# Patient Record
Sex: Female | Born: 1950 | Race: White | Hispanic: No | Smoking: Never smoker
Health system: Southern US, Community
[De-identification: ages and names within clinical notes are randomized; demographics above are authoritative.]

## PROBLEM LIST (undated history)

## (undated) DIAGNOSIS — K219 Gastro-esophageal reflux disease without esophagitis: Secondary | ICD-10-CM

## (undated) DIAGNOSIS — E119 Type 2 diabetes mellitus without complications: Secondary | ICD-10-CM

## (undated) DIAGNOSIS — R112 Nausea with vomiting, unspecified: Secondary | ICD-10-CM

## (undated) DIAGNOSIS — I10 Essential (primary) hypertension: Secondary | ICD-10-CM

## (undated) DIAGNOSIS — C50919 Malignant neoplasm of unspecified site of unspecified female breast: Secondary | ICD-10-CM

## (undated) DIAGNOSIS — Z800 Family history of malignant neoplasm of digestive organs: Secondary | ICD-10-CM

## (undated) DIAGNOSIS — Z9889 Other specified postprocedural states: Secondary | ICD-10-CM

## (undated) DIAGNOSIS — M1711 Unilateral primary osteoarthritis, right knee: Secondary | ICD-10-CM

## (undated) DIAGNOSIS — Z923 Personal history of irradiation: Secondary | ICD-10-CM

## (undated) DIAGNOSIS — M1990 Unspecified osteoarthritis, unspecified site: Secondary | ICD-10-CM

## (undated) DIAGNOSIS — Z803 Family history of malignant neoplasm of breast: Secondary | ICD-10-CM

## (undated) DIAGNOSIS — I639 Cerebral infarction, unspecified: Secondary | ICD-10-CM

## (undated) DIAGNOSIS — C50911 Malignant neoplasm of unspecified site of right female breast: Principal | ICD-10-CM

## (undated) DIAGNOSIS — J189 Pneumonia, unspecified organism: Secondary | ICD-10-CM

## (undated) HISTORY — PX: REDUCTION MAMMAPLASTY: SUR839

## (undated) HISTORY — DX: Malignant neoplasm of unspecified site of right female breast: C50.911

## (undated) HISTORY — PX: KNEE ARTHROSCOPY: SUR90

## (undated) HISTORY — DX: Family history of malignant neoplasm of digestive organs: Z80.0

## (undated) HISTORY — DX: Family history of malignant neoplasm of breast: Z80.3

---

## 1986-02-01 DIAGNOSIS — J189 Pneumonia, unspecified organism: Secondary | ICD-10-CM

## 1986-02-01 HISTORY — DX: Pneumonia, unspecified organism: J18.9

## 1992-02-02 HISTORY — PX: ABDOMINAL HYSTERECTOMY: SHX81

## 1997-02-01 HISTORY — PX: ANAL FISSURE REPAIR: SHX2312

## 1997-07-12 ENCOUNTER — Ambulatory Visit (HOSPITAL_COMMUNITY): Admission: RE | Admit: 1997-07-12 | Discharge: 1997-07-12 | Payer: Self-pay | Admitting: *Deleted

## 1998-02-04 ENCOUNTER — Ambulatory Visit (HOSPITAL_COMMUNITY): Admission: RE | Admit: 1998-02-04 | Discharge: 1998-02-04 | Payer: Self-pay | Admitting: Obstetrics and Gynecology

## 1998-12-03 ENCOUNTER — Encounter: Admission: RE | Admit: 1998-12-03 | Discharge: 1998-12-03 | Payer: Self-pay | Admitting: Family Medicine

## 1998-12-03 ENCOUNTER — Encounter: Payer: Self-pay | Admitting: Obstetrics and Gynecology

## 1999-03-09 ENCOUNTER — Other Ambulatory Visit: Admission: RE | Admit: 1999-03-09 | Discharge: 1999-03-09 | Payer: Self-pay | Admitting: Obstetrics and Gynecology

## 1999-12-08 ENCOUNTER — Encounter: Payer: Self-pay | Admitting: Obstetrics and Gynecology

## 1999-12-08 ENCOUNTER — Encounter: Admission: RE | Admit: 1999-12-08 | Discharge: 1999-12-08 | Payer: Self-pay | Admitting: Obstetrics and Gynecology

## 2000-04-12 ENCOUNTER — Other Ambulatory Visit: Admission: RE | Admit: 2000-04-12 | Discharge: 2000-04-12 | Payer: Self-pay | Admitting: Obstetrics and Gynecology

## 2000-12-08 ENCOUNTER — Encounter: Payer: Self-pay | Admitting: Obstetrics and Gynecology

## 2000-12-08 ENCOUNTER — Encounter: Admission: RE | Admit: 2000-12-08 | Discharge: 2000-12-08 | Payer: Self-pay | Admitting: Obstetrics and Gynecology

## 2001-08-29 ENCOUNTER — Other Ambulatory Visit: Admission: RE | Admit: 2001-08-29 | Discharge: 2001-08-29 | Payer: Self-pay | Admitting: Obstetrics and Gynecology

## 2001-12-14 ENCOUNTER — Encounter: Admission: RE | Admit: 2001-12-14 | Discharge: 2001-12-14 | Payer: Self-pay | Admitting: Obstetrics and Gynecology

## 2001-12-14 ENCOUNTER — Encounter: Payer: Self-pay | Admitting: Obstetrics and Gynecology

## 2002-10-01 ENCOUNTER — Other Ambulatory Visit: Admission: RE | Admit: 2002-10-01 | Discharge: 2002-10-01 | Payer: Self-pay | Admitting: Obstetrics and Gynecology

## 2002-12-21 ENCOUNTER — Ambulatory Visit (HOSPITAL_COMMUNITY): Admission: RE | Admit: 2002-12-21 | Discharge: 2002-12-21 | Payer: Self-pay | Admitting: Obstetrics and Gynecology

## 2003-01-28 ENCOUNTER — Ambulatory Visit (HOSPITAL_COMMUNITY): Admission: RE | Admit: 2003-01-28 | Discharge: 2003-01-28 | Payer: Self-pay | Admitting: Gastroenterology

## 2003-11-11 ENCOUNTER — Other Ambulatory Visit: Admission: RE | Admit: 2003-11-11 | Discharge: 2003-11-11 | Payer: Self-pay | Admitting: Obstetrics and Gynecology

## 2004-01-03 ENCOUNTER — Ambulatory Visit (HOSPITAL_COMMUNITY): Admission: RE | Admit: 2004-01-03 | Discharge: 2004-01-03 | Payer: Self-pay | Admitting: Obstetrics and Gynecology

## 2005-02-01 DIAGNOSIS — Z923 Personal history of irradiation: Secondary | ICD-10-CM

## 2005-02-01 HISTORY — DX: Personal history of irradiation: Z92.3

## 2005-02-25 ENCOUNTER — Ambulatory Visit (HOSPITAL_COMMUNITY): Admission: RE | Admit: 2005-02-25 | Discharge: 2005-02-25 | Payer: Self-pay | Admitting: Obstetrics and Gynecology

## 2005-03-03 ENCOUNTER — Encounter (INDEPENDENT_AMBULATORY_CARE_PROVIDER_SITE_OTHER): Payer: Self-pay | Admitting: Diagnostic Radiology

## 2005-03-03 ENCOUNTER — Encounter (INDEPENDENT_AMBULATORY_CARE_PROVIDER_SITE_OTHER): Payer: Self-pay | Admitting: *Deleted

## 2005-03-03 ENCOUNTER — Encounter: Admission: RE | Admit: 2005-03-03 | Discharge: 2005-03-03 | Payer: Self-pay | Admitting: Obstetrics and Gynecology

## 2005-03-05 HISTORY — PX: BREAST BIOPSY: SHX20

## 2005-03-11 ENCOUNTER — Encounter: Admission: RE | Admit: 2005-03-11 | Discharge: 2005-03-11 | Payer: Self-pay | Admitting: Surgery

## 2005-03-18 ENCOUNTER — Encounter: Admission: RE | Admit: 2005-03-18 | Discharge: 2005-03-18 | Payer: Self-pay | Admitting: Surgery

## 2005-03-18 ENCOUNTER — Encounter (INDEPENDENT_AMBULATORY_CARE_PROVIDER_SITE_OTHER): Payer: Self-pay | Admitting: *Deleted

## 2005-03-18 ENCOUNTER — Ambulatory Visit (HOSPITAL_BASED_OUTPATIENT_CLINIC_OR_DEPARTMENT_OTHER): Admission: RE | Admit: 2005-03-18 | Discharge: 2005-03-18 | Payer: Self-pay | Admitting: Surgery

## 2005-03-23 ENCOUNTER — Ambulatory Visit: Payer: Self-pay | Admitting: Oncology

## 2005-03-25 HISTORY — PX: BREAST LUMPECTOMY: SHX2

## 2005-03-29 ENCOUNTER — Ambulatory Visit: Admission: RE | Admit: 2005-03-29 | Discharge: 2005-04-16 | Payer: Self-pay | Admitting: Radiation Oncology

## 2005-05-03 ENCOUNTER — Encounter: Admission: RE | Admit: 2005-05-03 | Discharge: 2005-05-03 | Payer: Self-pay | Admitting: *Deleted

## 2005-05-10 ENCOUNTER — Ambulatory Visit: Admission: RE | Admit: 2005-05-10 | Discharge: 2005-07-13 | Payer: Self-pay | Admitting: Radiation Oncology

## 2005-07-09 ENCOUNTER — Ambulatory Visit: Payer: Self-pay | Admitting: Oncology

## 2005-12-29 ENCOUNTER — Encounter: Admission: RE | Admit: 2005-12-29 | Discharge: 2005-12-29 | Payer: Self-pay | Admitting: Oncology

## 2006-07-19 ENCOUNTER — Ambulatory Visit: Payer: Self-pay | Admitting: Oncology

## 2007-01-02 ENCOUNTER — Encounter (INDEPENDENT_AMBULATORY_CARE_PROVIDER_SITE_OTHER): Payer: Self-pay | Admitting: Radiology

## 2007-01-02 ENCOUNTER — Encounter: Admission: RE | Admit: 2007-01-02 | Discharge: 2007-01-02 | Payer: Self-pay | Admitting: Surgery

## 2007-01-10 ENCOUNTER — Ambulatory Visit (HOSPITAL_COMMUNITY): Admission: RE | Admit: 2007-01-10 | Discharge: 2007-01-10 | Payer: Self-pay | Admitting: Oncology

## 2007-07-18 ENCOUNTER — Ambulatory Visit: Payer: Self-pay | Admitting: Oncology

## 2007-07-20 LAB — CBC WITH DIFFERENTIAL/PLATELET
BASO%: 0.4 % (ref 0.0–2.0)
EOS%: 1.1 % (ref 0.0–7.0)
MCH: 30.7 pg (ref 26.0–34.0)
MCHC: 34.5 g/dL (ref 32.0–36.0)
MCV: 89.1 fL (ref 81.0–101.0)
MONO%: 7 % (ref 0.0–13.0)
RBC: 4.22 10*6/uL (ref 3.70–5.32)
RDW: 14.5 % (ref 11.3–14.5)
lymph#: 1.5 10*3/uL (ref 0.9–3.3)

## 2007-07-21 LAB — COMPREHENSIVE METABOLIC PANEL
ALT: 18 U/L (ref 0–35)
AST: 16 U/L (ref 0–37)
Albumin: 4.4 g/dL (ref 3.5–5.2)
Alkaline Phosphatase: 60 U/L (ref 39–117)
BUN: 20 mg/dL (ref 6–23)
Potassium: 3.7 mEq/L (ref 3.5–5.3)
Sodium: 138 mEq/L (ref 135–145)
Total Protein: 7.3 g/dL (ref 6.0–8.3)

## 2008-01-03 ENCOUNTER — Encounter: Admission: RE | Admit: 2008-01-03 | Discharge: 2008-01-03 | Payer: Self-pay | Admitting: Oncology

## 2008-07-16 ENCOUNTER — Ambulatory Visit: Payer: Self-pay | Admitting: Oncology

## 2008-07-18 LAB — CBC WITH DIFFERENTIAL/PLATELET
BASO%: 0.5 % (ref 0.0–2.0)
Eosinophils Absolute: 0.2 10*3/uL (ref 0.0–0.5)
HCT: 35.5 % (ref 34.8–46.6)
LYMPH%: 30.3 % (ref 14.0–49.7)
MCHC: 36 g/dL (ref 31.5–36.0)
MONO#: 0.3 10*3/uL (ref 0.1–0.9)
NEUT#: 3.6 10*3/uL (ref 1.5–6.5)
NEUT%: 60.7 % (ref 38.4–76.8)
Platelets: 250 10*3/uL (ref 145–400)
WBC: 5.9 10*3/uL (ref 3.9–10.3)
lymph#: 1.8 10*3/uL (ref 0.9–3.3)

## 2008-07-19 LAB — COMPREHENSIVE METABOLIC PANEL
ALT: 19 U/L (ref 0–35)
CO2: 27 mEq/L (ref 19–32)
Calcium: 9.2 mg/dL (ref 8.4–10.5)
Chloride: 98 mEq/L (ref 96–112)
Glucose, Bld: 107 mg/dL — ABNORMAL HIGH (ref 70–99)
Sodium: 136 mEq/L (ref 135–145)
Total Protein: 6.7 g/dL (ref 6.0–8.3)

## 2008-07-19 LAB — CANCER ANTIGEN 27.29: CA 27.29: 24 U/mL (ref 0–39)

## 2008-07-19 LAB — VITAMIN D 25 HYDROXY (VIT D DEFICIENCY, FRACTURES): Vit D, 25-Hydroxy: 31 ng/mL (ref 30–89)

## 2008-08-08 ENCOUNTER — Ambulatory Visit (HOSPITAL_BASED_OUTPATIENT_CLINIC_OR_DEPARTMENT_OTHER): Admission: RE | Admit: 2008-08-08 | Discharge: 2008-08-08 | Payer: Self-pay | Admitting: Surgery

## 2008-08-08 ENCOUNTER — Encounter (INDEPENDENT_AMBULATORY_CARE_PROVIDER_SITE_OTHER): Payer: Self-pay | Admitting: Surgery

## 2009-01-02 ENCOUNTER — Ambulatory Visit: Payer: Self-pay | Admitting: Oncology

## 2009-01-03 ENCOUNTER — Encounter: Admission: RE | Admit: 2009-01-03 | Discharge: 2009-01-03 | Payer: Self-pay | Admitting: Oncology

## 2009-01-06 LAB — COMPREHENSIVE METABOLIC PANEL
Alkaline Phosphatase: 51 U/L (ref 39–117)
CO2: 31 mEq/L (ref 19–32)
Creatinine, Ser: 1.04 mg/dL (ref 0.40–1.20)
Glucose, Bld: 79 mg/dL (ref 70–99)
Sodium: 138 mEq/L (ref 135–145)
Total Bilirubin: 0.6 mg/dL (ref 0.3–1.2)
Total Protein: 7.2 g/dL (ref 6.0–8.3)

## 2009-01-07 ENCOUNTER — Ambulatory Visit (HOSPITAL_COMMUNITY): Admission: RE | Admit: 2009-01-07 | Discharge: 2009-01-07 | Payer: Self-pay | Admitting: Oncology

## 2009-02-01 HISTORY — PX: BREAST REDUCTION SURGERY: SHX8

## 2009-07-11 ENCOUNTER — Ambulatory Visit: Payer: Self-pay | Admitting: Oncology

## 2009-07-15 LAB — COMPREHENSIVE METABOLIC PANEL
ALT: 25 U/L (ref 0–35)
Alkaline Phosphatase: 53 U/L (ref 39–117)
Creatinine, Ser: 0.79 mg/dL (ref 0.40–1.20)
Sodium: 140 mEq/L (ref 135–145)
Total Bilirubin: 0.4 mg/dL (ref 0.3–1.2)
Total Protein: 7.1 g/dL (ref 6.0–8.3)

## 2009-07-15 LAB — CBC WITH DIFFERENTIAL/PLATELET
BASO%: 0.4 % (ref 0.0–2.0)
EOS%: 1.7 % (ref 0.0–7.0)
HCT: 39 % (ref 34.8–46.6)
LYMPH%: 26.1 % (ref 14.0–49.7)
MCH: 32 pg (ref 25.1–34.0)
MCHC: 35.2 g/dL (ref 31.5–36.0)
MCV: 90.9 fL (ref 79.5–101.0)
MONO%: 6.3 % (ref 0.0–14.0)
NEUT%: 65.5 % (ref 38.4–76.8)
Platelets: 275 10*3/uL (ref 145–400)
RBC: 4.3 10*6/uL (ref 3.70–5.45)
WBC: 6.9 10*3/uL (ref 3.9–10.3)

## 2009-07-22 ENCOUNTER — Ambulatory Visit (HOSPITAL_COMMUNITY): Admission: RE | Admit: 2009-07-22 | Discharge: 2009-07-22 | Payer: Self-pay | Admitting: Oncology

## 2010-01-05 ENCOUNTER — Encounter: Admission: RE | Admit: 2010-01-05 | Discharge: 2010-01-05 | Payer: Self-pay | Admitting: Oncology

## 2010-06-16 NOTE — Op Note (Signed)
[name], [name]                 ACCOUNT NO.:  [id number]   MEDICAL RECORD NO.:  [id number]          PATIENT TYPE:  AMB   LOCATION:  DSC                          FACILITY:  [facility]   PHYSICIAN:  Currie Paris, M.D.DATE OF BIRTH:  06/27/1950   DATE OF PROCEDURE:  08/08/2008  DATE OF DISCHARGE:                               OPERATIVE REPORT   PREOPERATIVE DIAGNOSIS:  Lipoma, right leg.   POSTOPERATIVE DIAGNOSIS:  Lipoma, right leg.   OPERATION:  This is a 60 year old lady with a gradually enlarging soft  tissue mass of the medial aspect of the right leg just above the knee.  It measured about 4-5 cm in size.  It was also knowing to her when she  is walking around, it gave her some discomfort, so she wished to have  this removed.   DESCRIPTION OF PROCEDURE:  The patient was seen in the minor procedure  room and the lipoma located and plans for the surgery discussed and  reviewed with the patient.  The area was then anesthetized with 1%  Xylocaine with epi.  I waited 10 minutes for that to take full effect  and then the area was prepped with Betadine and draped.   Vertical incision was made and the subcutaneous tissue entered.  The  lipoma was readily visible and was gently dissected out with a blunt  dissection and came out intact and in toto.  It measured 5 cm in longest  diameter.   Everything appeared to be dry.  The incision was closed in layers with 3-  0 Vicryl, 4-0 Monocryl, subcuticular, and Dermabond.   The patient tolerated the procedure well and there were no  complications.      Currie Paris, M.D.  Electronically Signed     CJS/MEDQ  D:  08/08/2008  T:  08/08/2008  Job:  [id number]

## 2010-06-19 NOTE — Op Note (Signed)
NAMEAISHANI, KALIS                 ACCOUNT NO.:  [id number]   MEDICAL RECORD NO.:  [id number]          PATIENT TYPE:  AMB   LOCATION:  DSC                          FACILITY:  [facility]   PHYSICIAN:  Currie Paris, M.D.DATE OF BIRTH:  1950/06/17   DATE OF PROCEDURE:  03/18/2005  DATE OF DISCHARGE:  02/25/2005                                 OPERATIVE REPORT   OFFICE MEDICAL RECORD NUMBER [id number].   PREOPERATIVE DIAGNOSIS:  Ductal in situ carcinoma, right breast upper outer  quadrant.   POSTOPERATIVE DIAGNOSIS:  Ductal in situ carcinoma, right breast upper outer  quadrant.   OPERATION:  Needle guided excision right breast cancer.   SURGEON:  Currie Paris, M.D.   ANESTHESIA:  General.   CLINICAL HISTORY:  This is a 67 lady whose recent routine mammogram recently  showed a focus of calcifications in the upper outer quadrant of the right  breast.  A stereotactic core biopsy showed DCIS.  MRI showed no other  obvious malignancies, and she was scheduled for needle guided excisional  biopsy.   DESCRIPTION OF PROCEDURE:  The patient was seen in the holding area, and she  had no further questions.  We both identified and marked the right breast as  the operative side, and she already had her guidewire placed into the right  breast.  I reviewed the films and ascertained that the guidewire appeared  very close the calcifications and the clip that had been placed at the time  of the original biopsy.   The patient was taken to the operating room and fter satisfactory general  anesthesia had been obtained the right breast was prepped and draped.  The  time-out occurred.   I made a curvilinear incision taking a small sliver of the skin around the  guidewire and to incorporate the prior skin biopsy site.  The guidewire  tracked deep, slightly laterally and slightly inferiorly.  On the  preoperative mammogram, the guidewire appeared to track more inferiorly than  it appeared when  the patient was supine.   Once the incision was made, I used the cautery and divided the breast tissue  superior to the guidewire down towards the chest wall.  We went down several  centimeters almost to the chest wall and then I worked medially around the  guidewires the guidewire was tracking laterally.  As I worked inferiorly,  there was a fair amount of thickening and I think she had some post biopsy  change here with perhaps a little biopsy hematoma but I took care to get  around that inferiorly and then finally went under the area in question and  then lateral to it and completed the lumpectomy.  The guidewire was  completely inside of the specimen and I was beyond the tip of the guidewire.  By palpation to the firm area which again was related to biopsy was the  medial inferior aspect of the biopsy site.   While the specimen was being evaluated by mammography I went ahead and took  approximately another 1 cm piece of tissue from the  inferior, medial, and  deep margins to be sure these were all widely clear.  This was marked for  pathology.   I spent several minutes making sure everything was dry.  I put Marcaine in  to help with postoperative he marked cane in to help with postoperative  analgesia.  I reviewed the specimen mammogram and the calcifications  appeared to be in the center of the specimen.   Once everything was dry, I closed using some 3-0 Vicryl to try to  reapproximate some of the more superficial breast tissue and then 4-0  Monocryl subcuticular.   Dr. Derrek Gu, the pathologist, indicated that all the touch preps on margins  were negative.   Dermabond was applied and then sterile dressings.  The patient tolerated  procedure well. There no operative complications.  All counts were correct.      Currie Paris, M.D.  Electronically Signed     CJS/MEDQ  D:  03/18/2005  T:  03/18/2005  Job:  [id number]   cc:   Dellis Anes. Idell Pickles, M.D.  Fax: [phone]

## 2010-06-19 NOTE — Op Note (Signed)
[name], [name]                           ACCOUNT NO.:  [id number]   MEDICAL RECORD NO.:  [id number]                   PATIENT TYPE:  AMB   LOCATION:  ENDO                                 FACILITY:  [facility]   PHYSICIAN:  John C. Madilyn Fireman, M.D.                 DATE OF BIRTH:  [birth year]   DATE OF PROCEDURE:  01/28/2003  DATE OF DISCHARGE:                                 OPERATIVE REPORT   PROCEDURE:  Colonoscopy.   INDICATIONS FOR PROCEDURE:  Average risk colon cancer screening, in a 81-  year-old patient with no prior colon screening.   DESCRIPTION OF PROCEDURE:  The patient was placed in the left lateral  decubitus position and placed on the pulse monitor, with continuous low-flow  oxygen delivered by nasal cannula.  She was sedated with 100 mcg IV Fentanyl  and 8 mg IV Versed.  The Olympus video colonoscope was inserted into the  rectum and advanced to the cecum, confirmed by transillumination of  McBurney's point and visualization of the ileocecal valve and appendiceal  orifice.  The prep was excellent.  The cecum, ascending, transverse,  descending and sigmoid colon all appeared normal -- with no masses, polyps,  diverticula or other mucosal abnormalities.  The rectum, likewise, appeared  normal and retroflex view of the anus revealed no obvious internal  hemorrhoids.  The colonoscope was then withdrawn and the patient returned to  the recovery room in stable condition.  She tolerated the procedure well and  there were no immediate complications.   IMPRESSION:  Normal colonoscopy.   PLAN:  Repeat study in five years.                                               John C. Madilyn Fireman, M.D.    JCH/MEDQ  D:  01/28/2003  T:  01/28/2003  Job:  [id number]   cc:   Marcelino Duster L. Vincente Poli, M.D.  [address] [place]  [place] [address]  Fax: [phone]

## 2010-07-16 ENCOUNTER — Other Ambulatory Visit: Payer: Self-pay | Admitting: Oncology

## 2010-07-16 ENCOUNTER — Encounter (HOSPITAL_BASED_OUTPATIENT_CLINIC_OR_DEPARTMENT_OTHER): Payer: BC Managed Care – PPO | Admitting: Oncology

## 2010-07-16 DIAGNOSIS — Z170 Estrogen receptor positive status [ER+]: Secondary | ICD-10-CM

## 2010-07-16 DIAGNOSIS — D0590 Unspecified type of carcinoma in situ of unspecified breast: Secondary | ICD-10-CM

## 2010-07-16 LAB — VITAMIN D 25 HYDROXY (VIT D DEFICIENCY, FRACTURES): Vit D, 25-Hydroxy: 47 ng/mL (ref 30–89)

## 2010-07-16 LAB — CBC WITH DIFFERENTIAL/PLATELET
Basophils Absolute: 0 10*3/uL (ref 0.0–0.1)
EOS%: 2.9 % (ref 0.0–7.0)
Eosinophils Absolute: 0.2 10*3/uL (ref 0.0–0.5)
HCT: 37.3 % (ref 34.8–46.6)
HGB: 12.9 g/dL (ref 11.6–15.9)
MCH: 31.2 pg (ref 25.1–34.0)
MCV: 90.5 fL (ref 79.5–101.0)
MONO%: 6.2 % (ref 0.0–14.0)
NEUT#: 4.7 10*3/uL (ref 1.5–6.5)
NEUT%: 65.2 % (ref 38.4–76.8)
RDW: 14.3 % (ref 11.2–14.5)
lymph#: 1.8 10*3/uL (ref 0.9–3.3)

## 2010-07-16 LAB — COMPREHENSIVE METABOLIC PANEL
AST: 21 U/L (ref 0–37)
Albumin: 3.8 g/dL (ref 3.5–5.2)
BUN: 13 mg/dL (ref 6–23)
Calcium: 9.8 mg/dL (ref 8.4–10.5)
Chloride: 96 mEq/L (ref 96–112)
Creatinine, Ser: 0.7 mg/dL (ref 0.50–1.10)
Glucose, Bld: 160 mg/dL — ABNORMAL HIGH (ref 70–99)
Potassium: 3.3 mEq/L — ABNORMAL LOW (ref 3.5–5.3)

## 2010-07-22 ENCOUNTER — Other Ambulatory Visit: Payer: Self-pay | Admitting: Oncology

## 2010-07-22 ENCOUNTER — Encounter (HOSPITAL_BASED_OUTPATIENT_CLINIC_OR_DEPARTMENT_OTHER): Payer: BC Managed Care – PPO | Admitting: Oncology

## 2010-07-22 DIAGNOSIS — D0590 Unspecified type of carcinoma in situ of unspecified breast: Secondary | ICD-10-CM

## 2010-07-22 DIAGNOSIS — Z170 Estrogen receptor positive status [ER+]: Secondary | ICD-10-CM

## 2010-07-22 DIAGNOSIS — Z9889 Other specified postprocedural states: Secondary | ICD-10-CM

## 2011-01-02 ENCOUNTER — Telehealth: Payer: Self-pay | Admitting: Oncology

## 2011-01-02 NOTE — Telephone Encounter (Signed)
per pof 07/22/2010 called pt and scheduled her appts for [id number]

## 2011-01-07 ENCOUNTER — Ambulatory Visit
Admission: RE | Admit: 2011-01-07 | Discharge: 2011-01-07 | Disposition: A | Discharge status: Home / Self Care | Payer: BC Managed Care – PPO | Source: Ambulatory Visit | Attending: Oncology | Admitting: Oncology

## 2011-01-07 DIAGNOSIS — Z9889 Other specified postprocedural states: Secondary | ICD-10-CM

## 2011-01-18 ENCOUNTER — Other Ambulatory Visit: Payer: Self-pay | Admitting: Oncology

## 2011-01-18 DIAGNOSIS — Z9889 Other specified postprocedural states: Secondary | ICD-10-CM

## 2011-01-18 DIAGNOSIS — Z853 Personal history of malignant neoplasm of breast: Secondary | ICD-10-CM

## 2011-01-28 ENCOUNTER — Ambulatory Visit
Admission: RE | Admit: 2011-01-28 | Discharge: 2011-01-28 | Disposition: A | Discharge status: Home / Self Care | Payer: BC Managed Care – PPO | Source: Ambulatory Visit | Attending: Oncology | Admitting: Oncology

## 2011-01-28 DIAGNOSIS — Z853 Personal history of malignant neoplasm of breast: Secondary | ICD-10-CM

## 2011-01-28 DIAGNOSIS — Z9889 Other specified postprocedural states: Secondary | ICD-10-CM

## 2011-01-28 MED ORDER — GADOBENATE DIMEGLUMINE 529 MG/ML IV SOLN
19.0000 mL | Freq: Once | INTRAVENOUS | Status: AC | PRN
  Administered 2011-01-28: 19 mL via INTRAVENOUS

## 2011-03-11 ENCOUNTER — Other Ambulatory Visit: Payer: Self-pay | Admitting: Family Medicine

## 2011-03-11 DIAGNOSIS — R1011 Right upper quadrant pain: Secondary | ICD-10-CM

## 2011-03-18 ENCOUNTER — Ambulatory Visit
Admission: RE | Admit: 2011-03-18 | Discharge: 2011-03-18 | Disposition: A | Discharge status: Home / Self Care | Payer: BC Managed Care – PPO | Source: Ambulatory Visit | Attending: Family Medicine | Admitting: Family Medicine

## 2011-03-18 DIAGNOSIS — R1011 Right upper quadrant pain: Secondary | ICD-10-CM

## 2011-03-19 ENCOUNTER — Other Ambulatory Visit (HOSPITAL_COMMUNITY): Payer: Self-pay | Admitting: Family Medicine

## 2011-04-01 ENCOUNTER — Encounter (HOSPITAL_COMMUNITY)
Admission: RE | Admit: 2011-04-01 | Discharge: 2011-04-01 | Disposition: A | Discharge status: Home / Self Care | Payer: BC Managed Care – PPO | Source: Ambulatory Visit | Attending: Family Medicine | Admitting: Family Medicine

## 2011-04-01 DIAGNOSIS — R109 Unspecified abdominal pain: Secondary | ICD-10-CM | POA: Insufficient documentation

## 2011-04-01 MED ORDER — TECHNETIUM TC 99M MEBROFENIN IV KIT
5.0000 | PACK | Freq: Once | INTRAVENOUS | Status: AC | PRN
  Administered 2011-04-01: 5 via INTRAVENOUS

## 2011-04-01 MED ORDER — SINCALIDE 5 MCG IJ SOLR
INTRAMUSCULAR | Status: AC
  Administered 2011-04-01: 1.85 ug via INTRAVENOUS
  Filled 2011-04-01: qty 5

## 2011-07-14 ENCOUNTER — Other Ambulatory Visit (HOSPITAL_BASED_OUTPATIENT_CLINIC_OR_DEPARTMENT_OTHER): Payer: BC Managed Care – PPO | Admitting: Lab

## 2011-07-14 DIAGNOSIS — Z170 Estrogen receptor positive status [ER+]: Secondary | ICD-10-CM

## 2011-07-14 DIAGNOSIS — D0590 Unspecified type of carcinoma in situ of unspecified breast: Secondary | ICD-10-CM

## 2011-07-14 LAB — CBC WITH DIFFERENTIAL/PLATELET
BASO%: 0.4 % (ref 0.0–2.0)
EOS%: 1.2 % (ref 0.0–7.0)
HCT: 38.8 % (ref 34.8–46.6)
LYMPH%: 16.1 % (ref 14.0–49.7)
MCH: 30.9 pg (ref 25.1–34.0)
MCHC: 33.9 g/dL (ref 31.5–36.0)
NEUT%: 76.9 % — ABNORMAL HIGH (ref 38.4–76.8)
lymph#: 1.5 10*3/uL (ref 0.9–3.3)

## 2011-07-15 LAB — COMPREHENSIVE METABOLIC PANEL
ALT: 28 U/L (ref 0–35)
AST: 20 U/L (ref 0–37)
Creatinine, Ser: 0.85 mg/dL (ref 0.50–1.10)
Total Bilirubin: 0.5 mg/dL (ref 0.3–1.2)

## 2011-07-21 ENCOUNTER — Ambulatory Visit (HOSPITAL_BASED_OUTPATIENT_CLINIC_OR_DEPARTMENT_OTHER): Payer: BC Managed Care – PPO | Admitting: Physician Assistant

## 2011-07-21 ENCOUNTER — Encounter: Payer: Self-pay | Admitting: Physician Assistant

## 2011-07-21 VITALS — BP 156/91 | HR 101 | Temp 98.2°F | Wt 209.2 lb

## 2011-07-21 DIAGNOSIS — C50911 Malignant neoplasm of unspecified site of right female breast: Secondary | ICD-10-CM

## 2011-07-21 DIAGNOSIS — Z853 Personal history of malignant neoplasm of breast: Secondary | ICD-10-CM

## 2011-07-21 DIAGNOSIS — J329 Chronic sinusitis, unspecified: Secondary | ICD-10-CM

## 2011-07-21 DIAGNOSIS — Z170 Estrogen receptor positive status [ER+]: Secondary | ICD-10-CM

## 2011-07-21 DIAGNOSIS — D0590 Unspecified type of carcinoma in situ of unspecified breast: Secondary | ICD-10-CM

## 2011-07-21 HISTORY — DX: Malignant neoplasm of unspecified site of right female breast: C50.911

## 2011-07-21 MED ORDER — AZITHROMYCIN 1 G PO PACK: 1.0000 | PACK | Freq: Once | ORAL | Status: AC

## 2011-07-21 NOTE — Progress Notes (Signed)
ID: [name]   DOB: [birth year]  MR#: [id number]  NWG#:[id number]  HISTORY OF PRESENT ILLNESS: [name] had a screening mammogram at the breast center on February 25, 2005, which showed a possible mass and some calcifications in the right breast.  She was called back for additional views on January 31 and spot magnification showed a persistence of cluster of pleomorphic microcalcifications with some added density.  There was no palpable mass.  Stereotatic biopsy was obtained on the same day and showed ([id number] and [phone]) a ductal carcinoma in situ, which was ER positive at 22% and PR positive at 9%.  With this information the patient was referred to Dr. Jamey Ripa and proceeded to bilateral breast MRIs on February 8.  This showed a 1.2 cm area of suspicious enhancement adjacent to the prior biopsy tract and worrisome for residual breast cancer.  There were some patchy islands of benign type tissue enhancement within both breasts and six-month follow-up MRI was recommended to establish a baseline.  With this information, the patient proceeded to needle localization and right lumpectomy on March 18, 2005.  The final pathology there ([id number]) showed no invasive component in a 1.5 cm ductal carcinoma in situ, which was grade 3 with necrosis present but with negative margins.  The closest margins were at 1 cm.  [name] received radiation therapy which was completed in May of 2007. At that time she began on tamoxifen, 20 mg daily. She continued tamoxifen for 5 years, discontinuing it in June of 2012. Now followed with observation alone.  INTERVAL HISTORY: [name] returns today for routine one-year followup of her right breast carcinoma. 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 She continues to take care of her elderly mother who lives in [place].    [name] has been off of her tamoxifen now since June of 2012, and "feels great". Her mammogram and  breast MRI were both obtained in December 2012 and were unremarkable.  REVIEW OF SYSTEMS: [name] has had some recent sinus congestion and feels pressure in her left sinuses. She has an occasional cough, but is blowing her nose a lot, productive of green mucous. No epistasis or hemoptysis. No fevers of 100 or above. Her upper teeth feel sore, especially on the left side. This has all been going on now for about 2-1/2 weeks.  Otherwise [name] continues to have some hot flashes. They seem to be a little more intense but less frequent than they were when she was on tamoxifen. She has had no rashes or skin changes. No abnormal bleeding. No nausea or change in bowel habits. No chest pain or shortness of breath. No abnormal headaches or dizziness. No unusual myalgias or arthralgias.  A detailed review of systems is otherwise noncontributory.  PAST MEDICAL HISTORY: Past Medical History  Diagnosis Date  . Breast cancer, right breast 07/21/2011    PAST SURGICAL HISTORY: History reviewed. No pertinent past surgical history.  FAMILY HISTORY History reviewed. No pertinent family history. There is no history of breast or ovarian cancer in the family.    GYNECOLOGIC HISTORY: She is GX, P2.  She is having a few hot flashes, but overall she does not feel she is postmenopausal at this point.  She is status post simple hysterectomy for endometriosis (no salpingoophorectomy).  SOCIAL HISTORY: She works as a Education administrator at [employer].  Her husband, [name], , does Presenter, broadcasting for [employer].  Their daughter [name], is a Child psychotherapist  with hospice in [place].  She is married with 2 daughters.  Their second daughter [name]  is 37 is a Publishing copy and lives in [place].   ADVANCED DIRECTIVES:  HEALTH MAINTENANCE: History  Substance Use Topics  . Smoking status: Never Smoker   . Smokeless tobacco: Never Used  . Alcohol Use: Yes     1 glass of wine monthly     Colonoscopy: Due in 2015  PAP: UTD,  Dr. Vincente Poli  Bone density:  Lipid panel: UTD, Dr. Tenny Craw  No Known Allergies  Current Outpatient Prescriptions  Medication Sig Dispense Refill  . ALPRAZolam (XANAX) 0.5 MG tablet       . calcium carbonate (OS-CAL) 600 MG TABS Take 600 mg by mouth 2 (two) times daily with a meal.      . cholecalciferol (VITAMIN D) 400 UNITS TABS Take 1,000 Units by mouth daily.      . cyanocobalamin 500 MCG tablet Take 500 mcg by mouth daily.      . meloxicam (MOBIC) 15 MG tablet       . Multiple Vitamin (MULTIVITAMIN) tablet Take 1 tablet by mouth daily.      . Probiotic Product (PROBIOTIC FORMULA PO) Take 1 tablet by mouth daily.      Marland Kitchen triamterene-hydrochlorothiazide (MAXZIDE-25) 37.5-25 MG per tablet         OBJECTIVE: Filed Vitals:   07/21/11 1423  BP: 156/91  Pulse: 101  Temp: 98.2 F (36.8 C)   ECOG FS: 0  Filed Weights   07/21/11 1423  Weight: 209 lb 3.2 oz (94.892 kg)   Physical Exam: HEENT:  Sclerae anicteric, conjunctivae pink.  Oropharynx clear.  There is tenderness to palpation and percussion of the maxillary sinuses, greater on the left than the right.   Nodes:  No cervical, supraclavicular, or axillary lymphadenopathy palpated.  Breast Exam:  Right breast is status post lumpectomy. No specialist nodularities, skin changes, or evidence of disease recurrence. Left breast is status post reduction mammoplasty with no suspicious masses, nodularity, skin changes, or nipple inversion. Breast tissue is dense to palpation bilaterally. Lungs:  Clear to auscultation bilaterally.  No crackles, rhonchi, or wheezes.   Heart:  Regular rate and rhythm.   Abdomen:  Soft, nontender.  Positive bowel sounds.  No organomegaly or masses palpated.   Musculoskeletal:  No focal spinal tenderness to palpation.  Extremities:  Benign.  No peripheral edema or cyanosis.   Skin:  Benign.   Neuro:  Nonfocal. Alert and oriented x3.    LAB RESULTS: Lab Results  Component Value Date   WBC 9.5 07/14/2011    NEUTROABS 7.3* 07/14/2011   HGB 13.2 07/14/2011   HCT 38.8 07/14/2011   MCV 91.1 07/14/2011   PLT 240 07/14/2011      Chemistry      Component Value Date/Time   NA 137 07/14/2011 1353   K 4.0 07/14/2011 1353   CL 99 07/14/2011 1353   CO2 26 07/14/2011 1353   BUN 13 07/14/2011 1353   CREATININE 0.85 07/14/2011 1353      Component Value Date/Time   CALCIUM 9.7 07/14/2011 1353   ALKPHOS 74 07/14/2011 1353   AST 20 07/14/2011 1353   ALT 28 07/14/2011 1353   BILITOT 0.5 07/14/2011 1353       Lab Results  Component Value Date   LABCA2 25 07/14/2011    Vitamin D was normal at 43 on 07/14/11    STUDIES: Most recent bilateral mammogram and breast MRI were both  obtained in December of 2012 and were unremarkable.   ASSESSMENT: 61 y.o. [name] woman   (1)  status post right lumpectomy in February 2007 for a 1.5-cm grade 3 ductal carcinoma in situ.  Ample margins.  ER/PR positive.    (2)  Status post radiation therapy, completed in May 2007   (3)  Began on tamoxifen, 20 mg daily, in may 2007, and continued for 5 years until June of 2012.  PLAN: Brantley appears to be doing extremely well off of the tamoxifen. She tells me she is "ready to graduate" and we will discharge her from the practice today. She'll continue to followup closely with Dr. Tenny Craw. She will need to have annual physicals, along with breast exams, and will also have her annual mammogram in December of each year. She knows she can call any time with any changes or problems.  Of note, Kemonie seems to have a sinusitis, and I have prescribed a Z-Pak for her today. She knows to follow up with her primary care physician if this does not resolve within the next week to 10 days.  Jamen Loiseau    07/21/2011

## 2011-12-22 ENCOUNTER — Other Ambulatory Visit: Payer: Self-pay | Admitting: Oncology

## 2011-12-22 DIAGNOSIS — Z853 Personal history of malignant neoplasm of breast: Secondary | ICD-10-CM

## 2012-01-11 ENCOUNTER — Ambulatory Visit
Admission: RE | Admit: 2012-01-11 | Discharge: 2012-01-11 | Disposition: A | Discharge status: Home / Self Care | Payer: Managed Care, Other (non HMO) | Source: Ambulatory Visit | Attending: Oncology | Admitting: Oncology

## 2012-01-11 DIAGNOSIS — Z853 Personal history of malignant neoplasm of breast: Secondary | ICD-10-CM

## 2012-08-10 DIAGNOSIS — I10 Essential (primary) hypertension: Secondary | ICD-10-CM | POA: Diagnosis present

## 2013-02-01 HISTORY — PX: APPENDECTOMY: SHX54

## 2013-08-17 DIAGNOSIS — Z8719 Personal history of other diseases of the digestive system: Secondary | ICD-10-CM

## 2013-08-24 NOTE — Discharge Summary (Signed)
 [facility]  Discharge Summary  PCP: Coye DELENA Horseman, MD Discharge Details   Admit date:         08/17/2013 Discharge date:         08/18/2013  Hospital LOS:    1 days  Active Hospital Problems   Diagnosis Date Noted POA  . *Acute appendicitis 08/17/2013 Unknown    Resolved Hospital Problems   Diagnosis Date Noted Date Resolved POA  No resolved problems to display.    Discharge Medication List as of 08/18/2013  9:46 AM    NEW medications   Details  amoxicillin-clavulanate (AUGMENTIN) 875-125 mg per tablet Take 1 tablet by mouth 2 (two) times daily., Starting 08/18/2013, Last dose on Tue 08/28/13, Normal    !! HYDROcodone-acetaminophen  (NORCO) 5-325 mg per tablet Take 1 tablet by mouth every 6 (six) hours as needed (for pain level 4-6)., Starting 08/18/2013, Until Tue 08/28/13, Print    !! HYDROcodone-acetaminophen  (NORCO) 5-325 mg per tablet Take 1 tablet by mouth every 6 (six) hours as needed for Pain., Starting 08/18/2013, Until Tue 08/28/13, Print     !! - Potential duplicate medications found. Please discuss with provider.    CONTINUED medications   Details  Calcium Carbonate-Vitamin D  (CALCIUM 600 + D PO) Take by mouth daily., Until Discontinued, Historical Med    Cholecalciferol (VITAMIN D ) 2000 UNITS tablet Take 2,000 Units by mouth daily., Until Discontinued, Historical Med    magnesium gluconate (MAGONATE) 500 MG tablet Take 500 mg by mouth 2 (two) times daily., Until Discontinued, Historical Med    Multiple Vitamin (MULTIVITAMIN) tablet Take 1 tablet by mouth daily., Until Discontinued, Historical Med    Probiotic Product (PROBIOTIC DAILY PO) Take by mouth daily., Until Discontinued, Historical Med    triamterene-hydrochlorothiazide (MAXZIDE-25) 37.5-25 mg per tablet TAKE ONE TABLET BY MOUTH DAILY, Normal       Hospital Course  Physicians involved in care during this hospitalization Attending Provider: Delon DELENA Niece,  MD Admitting Provider: Delon DELENA Niece, MD Consulting Physician: [facility] [place])  Indication for Admission: acute appendicitis Hospital Course:       Patient was admitted following surgery for observation.  The diet was advanced as tolerated and pain medication was transitioned to oral.  Home today, follow up as scheduled.   Bedside Procedures   No orders found      Procedure(s) (LRB): APPENDECTOMY LAPAROSCOPIC (N/A)  08/17/2013  Surgeon(s): Delon DELENA Niece, MD ------------------- [facility]   Activity Instructions   Activity as tolerated         Driving Restrictions      No driving while taking pain medication     Shower/Bath      You may shower in 48 hours, avoid tubs and swimming, you should sponge bathe until 48 hours          Diet Instructions   No dietary restrictions              Other Instructions   Apply ice pack to operative site for the next 24 hours, alternating 30 minutes on, 30 minutes off or as instructed by physician         DEEP BREATHE AND COUGH AND DRINK A LOT OF FLUIDS every 2 hours today and tomorrow         Follow up with me: CHRISTMAN, JENNIFER A      2-3 weeks or as scheduled Please call office for any questions or concerns [place] location [phone] [place] location [phone]  Follow up with:  ADRIANNE NEST A     Keep operative area clean and dry. Do not remove the Dressing unless instructed to Do so by your physician.         Notify Physician for increased pain at the operative site (or unrelieved pain)         Notify Physician for increased shortness of breath         Notify Physician for marked redness         Observe operative area for signs of infection      Temperature of 100.76F or above increase pain, redness or swelling, foul odor to drainage     Should Not:      Drive a car Operate machinery Operate power tools Drink alcoholic drinks (even  beer) While taking pain medication     Weight restrictions      Lifting restrictions 20  lbs           Appointments which have been scheduled   Aug 31, 2013  1:45 PM  Postop with NEST DELENA ADRIANNE, MD  [facility]) (--)   [address] [place] [address]  [phone]        Code Status: Prior   Electronically signed: NEST DELENA ADRIANNE, MD 08/24/2013 / 8:11 AM

## 2014-01-03 ENCOUNTER — Other Ambulatory Visit: Payer: Self-pay | Admitting: Oncology

## 2014-01-03 DIAGNOSIS — Z853 Personal history of malignant neoplasm of breast: Secondary | ICD-10-CM

## 2014-01-03 DIAGNOSIS — Z9889 Other specified postprocedural states: Secondary | ICD-10-CM

## 2014-01-28 ENCOUNTER — Other Ambulatory Visit: Payer: Self-pay | Admitting: Oncology

## 2014-01-28 ENCOUNTER — Ambulatory Visit
Admission: RE | Admit: 2014-01-28 | Discharge: 2014-01-28 | Disposition: A | Discharge status: Home / Self Care | Payer: Managed Care, Other (non HMO) | Source: Ambulatory Visit | Attending: Oncology | Admitting: Oncology

## 2014-01-28 DIAGNOSIS — Z853 Personal history of malignant neoplasm of breast: Secondary | ICD-10-CM

## 2014-01-28 DIAGNOSIS — Z9889 Other specified postprocedural states: Secondary | ICD-10-CM

## 2014-12-23 ENCOUNTER — Other Ambulatory Visit: Payer: Self-pay

## 2014-12-23 DIAGNOSIS — Z1231 Encounter for screening mammogram for malignant neoplasm of breast: Secondary | ICD-10-CM

## 2015-01-30 ENCOUNTER — Ambulatory Visit
Admission: RE | Admit: 2015-01-30 | Discharge: 2015-01-30 | Disposition: A | Discharge status: Home / Self Care | Payer: BLUE CROSS/BLUE SHIELD | Source: Ambulatory Visit

## 2015-01-30 DIAGNOSIS — Z1231 Encounter for screening mammogram for malignant neoplasm of breast: Secondary | ICD-10-CM

## 2015-12-30 ENCOUNTER — Other Ambulatory Visit: Payer: Self-pay | Admitting: Obstetrics and Gynecology

## 2015-12-30 DIAGNOSIS — Z1231 Encounter for screening mammogram for malignant neoplasm of breast: Secondary | ICD-10-CM

## 2016-02-03 ENCOUNTER — Ambulatory Visit
Admission: RE | Admit: 2016-02-03 | Discharge: 2016-02-03 | Disposition: A | Discharge status: Home / Self Care | Payer: Medicare Other | Source: Ambulatory Visit | Attending: Obstetrics and Gynecology | Admitting: Obstetrics and Gynecology

## 2016-02-03 DIAGNOSIS — Z1231 Encounter for screening mammogram for malignant neoplasm of breast: Secondary | ICD-10-CM

## 2016-12-27 ENCOUNTER — Other Ambulatory Visit: Payer: Self-pay | Admitting: Obstetrics and Gynecology

## 2016-12-27 DIAGNOSIS — Z1231 Encounter for screening mammogram for malignant neoplasm of breast: Secondary | ICD-10-CM

## 2017-02-04 ENCOUNTER — Ambulatory Visit
Admission: RE | Admit: 2017-02-04 | Discharge: 2017-02-04 | Disposition: A | Discharge status: Home / Self Care | Payer: Medicare Other | Source: Ambulatory Visit | Attending: Obstetrics and Gynecology | Admitting: Obstetrics and Gynecology

## 2017-02-04 ENCOUNTER — Encounter: Payer: Self-pay | Admitting: Radiology

## 2017-02-04 DIAGNOSIS — Z1231 Encounter for screening mammogram for malignant neoplasm of breast: Secondary | ICD-10-CM

## 2017-02-04 HISTORY — DX: Personal history of irradiation: Z92.3

## 2017-07-26 DIAGNOSIS — I872 Venous insufficiency (chronic) (peripheral): Secondary | ICD-10-CM | POA: Diagnosis present

## 2017-12-14 ENCOUNTER — Encounter (HOSPITAL_COMMUNITY): Payer: Self-pay | Admitting: Physician Assistant

## 2017-12-14 NOTE — Pre-Procedure Instructions (Signed)
[name]  12/14/2017      [facility], [place], [place] - [address] [place] [address] Phone: [phone] Fax: [phone]    Your procedure is scheduled on Nov. 25  Report to [facility] Admitting at 5:30  A.M.  Call this number if you have problems the morning of surgery:  [phone]   Remember:  Do not eat or drink after midnight.      Take these medicines the morning of surgery with A SIP OF WATER :              Alprazolam (xanax) if needed             Pantoprazole (protonix)              7 days prior to surgery STOP taking any Aspirin(unless otherwise instructed by your surgeon), Aleve, Naproxen, Ibuprofen, Motrin, Advil, Goody's, BC's, all herbal medications, fish oil, and all vitamins      Do not wear jewelry, make-up or nail polish.  Do not wear lotions, powders, or perfumes, or deodorant.  Do not shave 48 hours prior to surgery.  Men may shave face and neck.  Do not bring valuables to the hospital.  [facility] is not responsible for any belongings or valuables.  Contacts, dentures or bridgework may not be worn into surgery.  Leave your suitcase in the car.  After surgery it may be brought to your room.  For patients admitted to the hospital, discharge time will be determined by your treatment team.  Patients discharged the day of surgery will not be allowed to drive home.    Special instructions:  [facility]- Preparing For Surgery  Before surgery, you can play an important role. Because skin is not sterile, your skin needs to be as free of germs as possible. You can reduce the number of germs on your skin by washing with CHG (chlorahexidine gluconate) Soap before surgery.  CHG is an antiseptic cleaner which kills germs and bonds with the skin to continue killing germs even after washing.    Oral Hygiene is also important to reduce your risk of infection.  Remember - BRUSH YOUR TEETH THE  MORNING OF SURGERY WITH YOUR REGULAR TOOTHPASTE  Please do not use if you have an allergy to CHG or antibacterial soaps. If your skin becomes reddened/irritated stop using the CHG.  Do not shave (including legs and underarms) for at least 48 hours prior to first CHG shower. It is OK to shave your face.  Please follow these instructions carefully.   1. Shower the NIGHT BEFORE SURGERY and the MORNING OF SURGERY with CHG.   2. If you chose to wash your hair, wash your hair first as usual with your normal shampoo.  3. After you shampoo, rinse your hair and body thoroughly to remove the shampoo.  4. Use CHG as you would any other liquid soap. You can apply CHG directly to the skin and wash gently with a scrungie or a clean washcloth.   5. Apply the CHG Soap to your body ONLY FROM THE NECK DOWN.  Do not use on open wounds or open sores. Avoid contact with your eyes, ears, mouth and genitals (private parts). Wash Face and genitals (private parts)  with your normal soap.  6. Wash thoroughly, paying special attention to the area where your surgery will be performed.  7. Thoroughly rinse your  body with warm water from the neck down.  8. DO NOT shower/wash with your normal soap after using and rinsing off the CHG Soap.  9. Pat yourself dry with a CLEAN TOWEL.  10. Wear CLEAN PAJAMAS to bed the night before surgery, wear comfortable clothes the morning of surgery  11. Place CLEAN SHEETS on your bed the night of your first shower and DO NOT SLEEP WITH PETS.    Day of Surgery:  Do not apply any deodorants/lotions.  Please wear clean clothes to the hospital/surgery center.   Remember to brush your teeth WITH YOUR REGULAR TOOTHPASTE.    Please read over the following fact sheets that you were given. Coughing and Deep Breathing, MRSA Information and Surgical Site Infection Prevention

## 2017-12-14 NOTE — Progress Notes (Addendum)
No orders in Epic, called Derek Jack @ [facility] office to inform them.  PCP: Dr. Talbert Forest Corrington @ [facility] in [place]

## 2017-12-14 NOTE — H&P (Signed)
TOTAL KNEE ADMISSION H&P  Patient is being admitted for right total knee arthroplasty.  Subjective:  Chief Complaint:right knee pain.  HPI: [name], 67 y.o. female, has a history of pain and functional disability in the right knee due to arthritis and has failed non-surgical conservative treatments for greater than 12 weeks to includeNSAID's and/or analgesics, corticosteriod injections, viscosupplementation injections, flexibility and strengthening excercises, use of assistive devices, weight reduction as appropriate and activity modification.  Onset of symptoms was gradual, starting 10 years ago with gradually worsening course since that time. The patient noted prior procedures on the knee to include  arthroscopy and menisectomy on the right knee(s).  Patient currently rates pain in the right knee(s) at 10 out of 10 with activity. Patient has night pain, worsening of pain with activity and weight bearing, pain that interferes with activities of daily living, crepitus and joint swelling.  Patient has evidence of subchondral sclerosis, periarticular osteophytes and joint space narrowing by imaging studies.There is no active infection.  Patient Active Problem List   Diagnosis Date Noted  . Stasis dermatitis of both legs 07/26/2017  . Severe obesity (BMI 35.0-39.9) with comorbidity ([place]) 07/20/2017  . History of appendicitis 08/17/2013  . Essential hypertension 08/10/2012  . Breast cancer, right breast ([place]) 07/21/2011  . History of right breast cancer 07/21/2011   Past Medical History:  Diagnosis Date  . Breast cancer, right breast ([place]) 07/21/2011  . Hypertension   . Personal history of radiation therapy 2007    Past Surgical History:  Procedure Laterality Date  . BREAST BIOPSY Right 03/05/2005  . BREAST LUMPECTOMY Right 03/25/2005    No current facility-administered medications for this encounter.    Current Outpatient Medications  Medication Sig Dispense Refill Last Dose  .  ALPRAZolam (XANAX) 0.5 MG tablet Take 0.5 mg by mouth daily as needed for anxiety.    12/13/2017 at Unknown time  . CALCIUM-MAGNESIUM-ZINC PO Take 1 tablet by mouth daily.   12/14/2017 at Unknown time  . Cholecalciferol 125 MCG (5000 UT) TABS Take 5,000 Units by mouth daily.    12/14/2017 at Unknown time  . furosemide (LASIX) 20 MG tablet Take 20 mg by mouth 2 (two) times daily.   12/14/2017 at Unknown time  . ibuprofen (ADVIL,MOTRIN) 200 MG tablet Take 800 mg by mouth 2 (two) times daily as needed for moderate pain.   12/14/2017 at Unknown time  . losartan (COZAAR) 50 MG tablet Take 50 mg by mouth daily.   12/14/2017 at Unknown time  . Misc Natural Products (OSTEO BI-FLEX TRIPLE STRENGTH) TABS Take 1 tablet by mouth daily.   12/14/2017 at Unknown time  . Multiple Vitamin (MULTIVITAMIN) tablet Take 1 tablet by mouth daily.   12/14/2017 at Unknown time  . pantoprazole (PROTONIX) 40 MG tablet Take 40 mg by mouth daily.   12/14/2017 at Unknown time  . Probiotic Product (PROBIOTIC FORMULA PO) Take 1 tablet by mouth daily.   12/14/2017 at Unknown time   No Known Allergies  Social History   Tobacco Use  . Smoking status: Never Smoker  . Smokeless tobacco: Never Used  Substance Use Topics  . Alcohol use: Yes    Comment: 1 glass of wine monthly    No family history on file.   Review of Systems  Constitutional: Negative.   HENT: Negative.   Eyes: Negative.   Respiratory: Negative.   Cardiovascular: Negative.   Gastrointestinal: Negative.   Genitourinary: Negative.   Musculoskeletal: Positive for back pain and joint  pain.  Skin: Negative.   Neurological: Negative.   Endo/Heme/Allergies: Negative.   Psychiatric/Behavioral: Negative.     Objective:  Physical Exam  Constitutional: She is oriented to person, place, and time. She appears well-developed and well-nourished.  HENT:  Head: Normocephalic and atraumatic.  Mouth/Throat: Oropharynx is clear and moist.  Eyes: Pupils are equal,  round, and reactive to light. Conjunctivae are normal.  Neck: Neck supple.  Cardiovascular: Normal rate and regular rhythm.  Respiratory: Breath sounds normal.  GI: Bowel sounds are normal.  Genitourinary:  Genitourinary Comments: Not pertinent to current symptomatology therefore not examined.  Musculoskeletal:  The right knee has a valgus deformity. Active range of motion is 0-120 degrees. She has 2+ crepitus, 2+ synovitis. Well healed arthroscopy scars. Her lower extremity edema is significantly less than two months ago.  The left knee has an active range of motion of -5 -110. There is 2+ crepitus. Medial joint line tenderness. There is 2+ dorsalis pedis pulses. Significantly less lower extremity edema today. Skin is intact.   Neurological: She is alert and oriented to person, place, and time.  Skin: Skin is warm and dry.  Psychiatric: She has a normal mood and affect. Her behavior is normal.    Vital signs in last 24 hours: Temp:  [97.8 F (36.6 C)] 97.8 F (36.6 C) (11/13 1600) Pulse Rate:  [89] 89 (11/13 1600) BP: (130)/(81) 130/81 (11/13 1600) SpO2:  [97 %] 97 % (11/13 1600) Weight:  [99.8 kg] 99.8 kg (11/13 1600)  Labs:   Estimated body mass index is 35.51 kg/m as calculated from the following:   Height as of this encounter: 5\' 6"  (1.676 m).   Weight as of this encounter: 99.8 kg.   Imaging Review Plain radiographs demonstrate severe degenerative joint disease of the right knee(s). The overall alignment issignificant varus. The bone quality appears to be good for age and reported activity level.   Preoperative templating of the joint replacement has been completed, documented, and submitted to the Operating Room personnel in order to optimize intra-operative equipment management.    Patient's anticipated LOS is less than 2 midnights, meeting these requirements: - Younger than 87 - Lives within 1 hour of care - Has a competent adult at home to recover with post-op  recover - NO history of  - Chronic pain requiring opiods  - Diabetes  - Coronary Artery Disease  - Heart failure  - Heart attack  - Stroke  - DVT/VTE  - Cardiac arrhythmia  - Respiratory Failure/COPD  - Renal failure  - Anemia  - Advanced Liver disease        Assessment/Plan:  End stage arthritis, right knee   The patient history, physical examination, clinical judgment of the provider and imaging studies are consistent with end stage degenerative joint disease of the right knee(s) and total knee arthroplasty is deemed medically necessary. The treatment options including medical management, injection therapy arthroscopy and arthroplasty were discussed at length. The risks and benefits of total knee arthroplasty were presented and reviewed. The risks due to aseptic loosening, infection, stiffness, patella tracking problems, thromboembolic complications and other imponderables were discussed. The patient acknowledged the explanation, agreed to proceed with the plan and consent was signed. Patient is being admitted for inpatient treatment for surgery, pain control, PT, OT, prophylactic antibiotics, VTE prophylaxis, progressive ambulation and ADL's and discharge planning. The patient is planning to be discharged home with home health services

## 2017-12-15 ENCOUNTER — Encounter (HOSPITAL_COMMUNITY)
Admission: RE | Admit: 2017-12-15 | Discharge: 2017-12-15 | Disposition: A | Discharge status: Home / Self Care | Payer: Medicare Other | Source: Ambulatory Visit | Attending: Orthopedic Surgery | Admitting: Orthopedic Surgery

## 2017-12-15 ENCOUNTER — Encounter (HOSPITAL_COMMUNITY): Payer: Self-pay

## 2017-12-15 DIAGNOSIS — M13861 Other specified arthritis, right knee: Secondary | ICD-10-CM | POA: Diagnosis not present

## 2017-12-15 DIAGNOSIS — Z01818 Encounter for other preprocedural examination: Secondary | ICD-10-CM | POA: Diagnosis not present

## 2017-12-15 HISTORY — DX: Nausea with vomiting, unspecified: R11.2

## 2017-12-15 HISTORY — DX: Gastro-esophageal reflux disease without esophagitis: K21.9

## 2017-12-15 HISTORY — DX: Pneumonia, unspecified organism: J18.9

## 2017-12-15 HISTORY — DX: Unspecified osteoarthritis, unspecified site: M19.90

## 2017-12-15 HISTORY — DX: Other specified postprocedural states: Z98.890

## 2017-12-15 LAB — CBC WITH DIFFERENTIAL/PLATELET
Abs Immature Granulocytes: 0.04 10*3/uL (ref 0.00–0.07)
BASOS ABS: 0.1 10*3/uL (ref 0.0–0.1)
Basophils Relative: 1 %
Eosinophils Absolute: 0.1 10*3/uL (ref 0.0–0.5)
Eosinophils Relative: 1 %
HEMATOCRIT: 43.8 % (ref 36.0–46.0)
HEMOGLOBIN: 14.7 g/dL (ref 12.0–15.0)
IMMATURE GRANULOCYTES: 1 %
LYMPHS ABS: 1.6 10*3/uL (ref 0.7–4.0)
LYMPHS PCT: 20 %
MCH: 31.1 pg (ref 26.0–34.0)
MCHC: 33.6 g/dL (ref 30.0–36.0)
MCV: 92.8 fL (ref 80.0–100.0)
Monocytes Absolute: 0.7 10*3/uL (ref 0.1–1.0)
Monocytes Relative: 9 %
NRBC: 0 % (ref 0.0–0.2)
Neutro Abs: 5.5 10*3/uL (ref 1.7–7.7)
Neutrophils Relative %: 68 %
Platelets: 299 10*3/uL (ref 150–400)
RBC: 4.72 MIL/uL (ref 3.87–5.11)
RDW: 13.5 % (ref 11.5–15.5)
WBC: 8 10*3/uL (ref 4.0–10.5)

## 2017-12-15 LAB — COMPREHENSIVE METABOLIC PANEL WITH GFR
ALT: 51 U/L — ABNORMAL HIGH (ref 0–44)
AST: 44 U/L — ABNORMAL HIGH (ref 15–41)
Albumin: 4.6 g/dL (ref 3.5–5.0)
Alkaline Phosphatase: 69 U/L (ref 38–126)
Anion gap: 12 (ref 5–15)
BUN: 10 mg/dL (ref 8–23)
CO2: 26 mmol/L (ref 22–32)
Calcium: 9.8 mg/dL (ref 8.9–10.3)
Chloride: 101 mmol/L (ref 98–111)
Creatinine, Ser: 0.73 mg/dL (ref 0.44–1.00)
GFR calc Af Amer: >60 mL/min (ref >60)
GFR calc non Af Amer: >60 mL/min (ref >60)
Glucose, Bld: 104 mg/dL — ABNORMAL HIGH (ref 70–99)
Potassium: 3.2 mmol/L — ABNORMAL LOW (ref 3.5–5.1)
Sodium: 139 mmol/L (ref 135–145)
Total Bilirubin: 0.9 mg/dL (ref 0.3–1.2)
Total Protein: 7.6 g/dL (ref 6.5–8.1)

## 2017-12-15 LAB — TYPE AND SCREEN
ABO/RH(D): A POS
ANTIBODY SCREEN: NEGATIVE

## 2017-12-15 LAB — SURGICAL PCR SCREEN
MRSA, PCR: NEGATIVE
Staphylococcus aureus: NEGATIVE

## 2017-12-15 LAB — ABO/RH: ABO/RH(D): A POS

## 2017-12-15 LAB — PROTIME-INR
INR: 1
Prothrombin Time: 13.1 s (ref 11.4–15.2)

## 2017-12-15 LAB — APTT: aPTT: 29 seconds (ref 24–36)

## 2017-12-16 LAB — URINE CULTURE: Culture: NO GROWTH

## 2017-12-19 ENCOUNTER — Other Ambulatory Visit: Payer: Self-pay | Admitting: Orthopedic Surgery

## 2017-12-19 NOTE — Care Plan (Signed)
Spoke with patient. She will discharge to home with family and HHPT. She has all needed equipment.   Ladell Heads, [place]

## 2017-12-23 MED ORDER — BUPIVACAINE LIPOSOME 1.3 % IJ SUSP
20.0000 mL | Freq: Once | INTRAMUSCULAR | Status: DC
  Filled 2017-12-23: qty 20

## 2017-12-23 MED ORDER — TRANEXAMIC ACID-NACL 1000-0.7 MG/100ML-% IV SOLN
1000.0000 mg | INTRAVENOUS | Status: AC
  Administered 2017-12-26: 1000 mg via INTRAVENOUS
  Filled 2017-12-23: qty 100

## 2017-12-25 NOTE — Anesthesia Preprocedure Evaluation (Addendum)
Anesthesia Evaluation  Patient identified by MRN, date of birth, ID band Patient awake    Reviewed: Allergy & Precautions, H&P , NPO status , Patient's Chart, lab work & pertinent test results  History of Anesthesia Complications (+) PONV  Airway Mallampati: II  TM Distance: >3 FB Neck ROM: Full    Dental no notable dental hx. (+) Teeth Intact, Dental Advisory Given   Pulmonary    Pulmonary exam normal breath sounds clear to auscultation       Cardiovascular hypertension, Pt. on medications Normal cardiovascular exam Rhythm:Regular Rate:Normal     Neuro/Psych negative neurological ROS  negative psych ROS   GI/Hepatic Neg liver ROS, GERD  Medicated and Controlled,  Endo/Other  negative endocrine ROSMorbid obesity  Renal/GU negative Renal ROS  negative genitourinary   Musculoskeletal  (+) Arthritis ,   Abdominal   Peds  Hematology negative hematology ROS (+)   Anesthesia Other Findings Hx of breast ca s/p radiation  Reproductive/Obstetrics                           Lab Results  Component Value Date   WBC 8.0 12/15/2017   HGB 14.7 12/15/2017   HCT 43.8 12/15/2017   MCV 92.8 12/15/2017   PLT 299 12/15/2017    Anesthesia Physical Anesthesia Plan  ASA: III  Anesthesia Plan: Spinal   Post-op Pain Management:  Regional for Post-op pain   Induction:   PONV Risk Score and Plan: 2 and Treatment may vary due to age or medical condition, Dexamethasone and Ondansetron  Airway Management Planned: Natural Airway  Additional Equipment:   Intra-op Plan:   Post-operative Plan:   Informed Consent: I have reviewed the patients History and Physical, chart, labs and discussed the procedure including the risks, benefits and alternatives for the proposed anesthesia with the patient or authorized representative who has indicated his/her understanding and acceptance.   Dental advisory  given  Plan Discussed with:   Anesthesia Plan Comments:         Anesthesia Quick Evaluation

## 2017-12-26 ENCOUNTER — Inpatient Hospital Stay (HOSPITAL_COMMUNITY): Payer: Medicare Other | Admitting: Anesthesiology

## 2017-12-26 ENCOUNTER — Encounter (HOSPITAL_COMMUNITY): Payer: Self-pay

## 2017-12-26 ENCOUNTER — Encounter (HOSPITAL_COMMUNITY)
Admission: RE | Disposition: A | Discharge status: Home / Self Care | Payer: Self-pay | Source: Ambulatory Visit | Attending: Orthopedic Surgery

## 2017-12-26 ENCOUNTER — Other Ambulatory Visit: Payer: Self-pay

## 2017-12-26 ENCOUNTER — Inpatient Hospital Stay (HOSPITAL_COMMUNITY)
Admission: RE | Admit: 2017-12-26 | Discharge: 2017-12-28 | DRG: 470 | Disposition: A | Discharge status: Home / Self Care | Payer: Medicare Other | Source: Ambulatory Visit | Attending: Orthopedic Surgery | Admitting: Orthopedic Surgery

## 2017-12-26 DIAGNOSIS — Z79899 Other long term (current) drug therapy: Secondary | ICD-10-CM

## 2017-12-26 DIAGNOSIS — Z853 Personal history of malignant neoplasm of breast: Secondary | ICD-10-CM

## 2017-12-26 DIAGNOSIS — I872 Venous insufficiency (chronic) (peripheral): Secondary | ICD-10-CM | POA: Diagnosis present

## 2017-12-26 DIAGNOSIS — Z923 Personal history of irradiation: Secondary | ICD-10-CM | POA: Diagnosis not present

## 2017-12-26 DIAGNOSIS — K219 Gastro-esophageal reflux disease without esophagitis: Secondary | ICD-10-CM | POA: Diagnosis present

## 2017-12-26 DIAGNOSIS — Z6835 Body mass index (BMI) 35.0-35.9, adult: Secondary | ICD-10-CM | POA: Diagnosis not present

## 2017-12-26 DIAGNOSIS — Z8719 Personal history of other diseases of the digestive system: Secondary | ICD-10-CM

## 2017-12-26 DIAGNOSIS — M1711 Unilateral primary osteoarthritis, right knee: Secondary | ICD-10-CM | POA: Diagnosis present

## 2017-12-26 DIAGNOSIS — I10 Essential (primary) hypertension: Secondary | ICD-10-CM | POA: Diagnosis present

## 2017-12-26 DIAGNOSIS — C50911 Malignant neoplasm of unspecified site of right female breast: Secondary | ICD-10-CM | POA: Diagnosis present

## 2017-12-26 HISTORY — DX: Essential (primary) hypertension: I10

## 2017-12-26 HISTORY — PX: TOTAL KNEE ARTHROPLASTY: SHX125

## 2017-12-26 HISTORY — DX: Unilateral primary osteoarthritis, right knee: M17.11

## 2017-12-26 SURGERY — ARTHROPLASTY, KNEE, TOTAL
Anesthesia: Spinal | Site: Knee | Laterality: Right

## 2017-12-26 MED ORDER — PROPOFOL 500 MG/50ML IV EMUL
INTRAVENOUS | Status: DC | PRN
  Administered 2017-12-26: 75 ug/kg/min via INTRAVENOUS

## 2017-12-26 MED ORDER — MIDAZOLAM HCL 5 MG/5ML IJ SOLN
INTRAMUSCULAR | Status: DC | PRN
  Administered 2017-12-26: 2 mg via INTRAVENOUS

## 2017-12-26 MED ORDER — SODIUM CHLORIDE 0.9 % IR SOLN
Status: DC | PRN
  Administered 2017-12-26: 3000 mL

## 2017-12-26 MED ORDER — DEXAMETHASONE SODIUM PHOSPHATE 10 MG/ML IJ SOLN
10.0000 mg | Freq: Three times a day (TID) | INTRAMUSCULAR | Status: AC
  Administered 2017-12-26 – 2017-12-27 (×4): 10 mg via INTRAVENOUS
  Filled 2017-12-26 (×4): qty 1

## 2017-12-26 MED ORDER — BUPIVACAINE IN DEXTROSE 0.75-8.25 % IT SOLN
INTRATHECAL | Status: DC | PRN
  Administered 2017-12-26: 13.5 mg via INTRATHECAL

## 2017-12-26 MED ORDER — METOCLOPRAMIDE HCL 5 MG/ML IJ SOLN: 5.0000 mg | Freq: Three times a day (TID) | INTRAMUSCULAR | Status: DC | PRN

## 2017-12-26 MED ORDER — PHENYLEPHRINE 40 MCG/ML (10ML) SYRINGE FOR IV PUSH (FOR BLOOD PRESSURE SUPPORT)
PREFILLED_SYRINGE | INTRAVENOUS | Status: AC
  Filled 2017-12-26: qty 10

## 2017-12-26 MED ORDER — ONDANSETRON HCL 4 MG/2ML IJ SOLN
INTRAMUSCULAR | Status: DC | PRN
  Administered 2017-12-26: 4 mg via INTRAVENOUS

## 2017-12-26 MED ORDER — PANTOPRAZOLE SODIUM 40 MG PO TBEC
40.0000 mg | DELAYED_RELEASE_TABLET | Freq: Every day | ORAL | Status: DC
  Administered 2017-12-27 – 2017-12-28 (×2): 40 mg via ORAL
  Filled 2017-12-26 (×2): qty 1

## 2017-12-26 MED ORDER — OXYCODONE HCL 5 MG PO TABS
5.0000 mg | ORAL_TABLET | ORAL | Status: DC | PRN
  Administered 2017-12-26 – 2017-12-28 (×6): 10 mg via ORAL
  Filled 2017-12-26 (×6): qty 2

## 2017-12-26 MED ORDER — PHENYLEPHRINE 40 MCG/ML (10ML) SYRINGE FOR IV PUSH (FOR BLOOD PRESSURE SUPPORT)
PREFILLED_SYRINGE | INTRAVENOUS | Status: DC | PRN
  Administered 2017-12-26: 40 ug via INTRAVENOUS
  Administered 2017-12-26 (×2): 80 ug via INTRAVENOUS

## 2017-12-26 MED ORDER — ALPRAZOLAM 0.5 MG PO TABS: 0.5000 mg | ORAL_TABLET | Freq: Three times a day (TID) | ORAL | Status: DC | PRN

## 2017-12-26 MED ORDER — HYDROMORPHONE HCL 1 MG/ML IJ SOLN
0.5000 mg | INTRAMUSCULAR | Status: DC | PRN
  Administered 2017-12-27: 1 mg via INTRAVENOUS
  Filled 2017-12-26: qty 1

## 2017-12-26 MED ORDER — DIPHENHYDRAMINE HCL 12.5 MG/5ML PO ELIX: 12.5000 mg | ORAL_SOLUTION | ORAL | Status: DC | PRN

## 2017-12-26 MED ORDER — PHENOL 1.4 % MT LIQD: 1.0000 | OROMUCOSAL | Status: DC | PRN

## 2017-12-26 MED ORDER — 0.9 % SODIUM CHLORIDE (POUR BTL) OPTIME
TOPICAL | Status: DC | PRN
  Administered 2017-12-26: 1000 mL

## 2017-12-26 MED ORDER — POLYETHYLENE GLYCOL 3350 17 G PO PACK
17.0000 g | PACK | Freq: Two times a day (BID) | ORAL | Status: DC
  Administered 2017-12-26 – 2017-12-28 (×5): 17 g via ORAL
  Filled 2017-12-26 (×5): qty 1

## 2017-12-26 MED ORDER — CEFAZOLIN SODIUM-DEXTROSE 2-4 GM/100ML-% IV SOLN
2.0000 g | Freq: Four times a day (QID) | INTRAVENOUS | Status: DC
  Filled 2017-12-26: qty 100

## 2017-12-26 MED ORDER — LACTATED RINGERS IV SOLN
INTRAVENOUS | Status: DC | PRN
  Administered 2017-12-26 (×2): via INTRAVENOUS

## 2017-12-26 MED ORDER — BUPIVACAINE-EPINEPHRINE 0.5% -1:200000 IJ SOLN
INTRAMUSCULAR | Status: AC
  Filled 2017-12-26: qty 1

## 2017-12-26 MED ORDER — POTASSIUM CHLORIDE IN NACL 20-0.9 MEQ/L-% IV SOLN
INTRAVENOUS | Status: DC
  Administered 2017-12-26: 12:00:00 via INTRAVENOUS
  Filled 2017-12-26 (×2): qty 1000

## 2017-12-26 MED ORDER — LIDOCAINE 2% (20 MG/ML) 5 ML SYRINGE
INTRAMUSCULAR | Status: AC
  Filled 2017-12-26: qty 5

## 2017-12-26 MED ORDER — MIDAZOLAM HCL 2 MG/2ML IJ SOLN
INTRAMUSCULAR | Status: AC
  Filled 2017-12-26: qty 2

## 2017-12-26 MED ORDER — CEFAZOLIN SODIUM-DEXTROSE 2-4 GM/100ML-% IV SOLN
2.0000 g | Freq: Four times a day (QID) | INTRAVENOUS | Status: AC
  Administered 2017-12-26 (×2): 2 g via INTRAVENOUS
  Filled 2017-12-26 (×2): qty 100

## 2017-12-26 MED ORDER — CEFAZOLIN SODIUM-DEXTROSE 2-4 GM/100ML-% IV SOLN
2.0000 g | INTRAVENOUS | Status: AC
  Administered 2017-12-26: 2 g via INTRAVENOUS
  Filled 2017-12-26: qty 100

## 2017-12-26 MED ORDER — GABAPENTIN 300 MG PO CAPS
300.0000 mg | ORAL_CAPSULE | Freq: Every day | ORAL | Status: DC
  Administered 2017-12-26 – 2017-12-27 (×2): 300 mg via ORAL
  Filled 2017-12-26 (×2): qty 1

## 2017-12-26 MED ORDER — ONDANSETRON HCL 4 MG/2ML IJ SOLN: 4.0000 mg | Freq: Four times a day (QID) | INTRAMUSCULAR | Status: DC | PRN

## 2017-12-26 MED ORDER — FENTANYL CITRATE (PF) 100 MCG/2ML IJ SOLN
INTRAMUSCULAR | Status: DC | PRN
  Administered 2017-12-26: 50 ug via INTRAVENOUS

## 2017-12-26 MED ORDER — DOCUSATE SODIUM 100 MG PO CAPS
100.0000 mg | ORAL_CAPSULE | Freq: Two times a day (BID) | ORAL | Status: DC
  Administered 2017-12-26 – 2017-12-28 (×5): 100 mg via ORAL
  Filled 2017-12-26 (×5): qty 1

## 2017-12-26 MED ORDER — DEXAMETHASONE SODIUM PHOSPHATE 10 MG/ML IJ SOLN
INTRAMUSCULAR | Status: DC | PRN
  Administered 2017-12-26: 10 mg via INTRAVENOUS

## 2017-12-26 MED ORDER — DEXAMETHASONE SODIUM PHOSPHATE 10 MG/ML IJ SOLN
INTRAMUSCULAR | Status: AC
  Filled 2017-12-26: qty 1

## 2017-12-26 MED ORDER — ONDANSETRON HCL 4 MG/2ML IJ SOLN
INTRAMUSCULAR | Status: AC
  Filled 2017-12-26: qty 2

## 2017-12-26 MED ORDER — CHLORHEXIDINE GLUCONATE 4 % EX LIQD: 60.0000 mL | Freq: Once | CUTANEOUS | Status: DC

## 2017-12-26 MED ORDER — PROPOFOL 10 MG/ML IV BOLUS
INTRAVENOUS | Status: DC | PRN
  Administered 2017-12-26 (×2): 20 mg via INTRAVENOUS

## 2017-12-26 MED ORDER — SODIUM CHLORIDE 0.9 % IV SOLN
INTRAVENOUS | Status: DC | PRN
  Administered 2017-12-26: 25 ug/min via INTRAVENOUS

## 2017-12-26 MED ORDER — SODIUM CHLORIDE (PF) 0.9 % IJ SOLN
INTRAMUSCULAR | Status: DC | PRN
  Administered 2017-12-26: 50 mL

## 2017-12-26 MED ORDER — ASPIRIN EC 325 MG PO TBEC
325.0000 mg | DELAYED_RELEASE_TABLET | Freq: Every day | ORAL | Status: DC
  Administered 2017-12-27 – 2017-12-28 (×2): 325 mg via ORAL
  Filled 2017-12-26 (×2): qty 1

## 2017-12-26 MED ORDER — ONDANSETRON HCL 4 MG PO TABS: 4.0000 mg | ORAL_TABLET | Freq: Four times a day (QID) | ORAL | Status: DC | PRN

## 2017-12-26 MED ORDER — POVIDONE-IODINE 7.5 % EX SOLN: Freq: Once | CUTANEOUS | Status: DC

## 2017-12-26 MED ORDER — FENTANYL CITRATE (PF) 250 MCG/5ML IJ SOLN
INTRAMUSCULAR | Status: AC
  Filled 2017-12-26: qty 5

## 2017-12-26 MED ORDER — ROPIVACAINE HCL 5 MG/ML IJ SOLN
INTRAMUSCULAR | Status: DC | PRN
  Administered 2017-12-26: 30 mL via PERINEURAL

## 2017-12-26 MED ORDER — LOSARTAN POTASSIUM 50 MG PO TABS
50.0000 mg | ORAL_TABLET | Freq: Every day | ORAL | Status: DC
  Administered 2017-12-26 – 2017-12-28 (×3): 50 mg via ORAL
  Filled 2017-12-26 (×3): qty 1

## 2017-12-26 MED ORDER — BUPIVACAINE LIPOSOME 1.3 % IJ SUSP
INTRAMUSCULAR | Status: DC | PRN
  Administered 2017-12-26: 20 mL

## 2017-12-26 MED ORDER — METOCLOPRAMIDE HCL 5 MG PO TABS: 5.0000 mg | ORAL_TABLET | Freq: Three times a day (TID) | ORAL | Status: DC | PRN

## 2017-12-26 MED ORDER — ACETAMINOPHEN 500 MG PO TABS
1000.0000 mg | ORAL_TABLET | Freq: Four times a day (QID) | ORAL | Status: AC
  Administered 2017-12-26 – 2017-12-27 (×4): 1000 mg via ORAL
  Filled 2017-12-26 (×4): qty 2

## 2017-12-26 MED ORDER — TRANEXAMIC ACID-NACL 1000-0.7 MG/100ML-% IV SOLN
1000.0000 mg | Freq: Once | INTRAVENOUS | Status: AC
  Administered 2017-12-26: 1000 mg via INTRAVENOUS
  Filled 2017-12-26: qty 100

## 2017-12-26 MED ORDER — MENTHOL 3 MG MT LOZG: 1.0000 | LOZENGE | OROMUCOSAL | Status: DC | PRN

## 2017-12-26 MED ORDER — PROPOFOL 10 MG/ML IV BOLUS
INTRAVENOUS | Status: AC
  Filled 2017-12-26: qty 20

## 2017-12-26 MED ORDER — CEFUROXIME SODIUM 1.5 G IV SOLR
INTRAVENOUS | Status: AC
  Filled 2017-12-26: qty 1.5

## 2017-12-26 MED ORDER — LACTATED RINGERS IV SOLN: INTRAVENOUS | Status: DC

## 2017-12-26 MED ORDER — BUPIVACAINE-EPINEPHRINE 0.5% -1:200000 IJ SOLN
INTRAMUSCULAR | Status: DC | PRN
  Administered 2017-12-26: 50 mL

## 2017-12-26 MED ORDER — ALUM & MAG HYDROXIDE-SIMETH 200-200-20 MG/5ML PO SUSP: 30.0000 mL | ORAL | Status: DC | PRN

## 2017-12-26 SURGICAL SUPPLY — 80 items
APL SKNCLS STERI-STRIP NONHPOA (GAUZE/BANDAGES/DRESSINGS) ×1
ATTUNE MED DOME PAT 32 KNEE (Knees) ×1 IMPLANT
ATTUNE PS FEM RT SZ 4 CEM KNEE (Femur) ×1 IMPLANT
ATTUNE PSRP INSR SZ4 6 KNEE (Insert) ×1 IMPLANT
BANDAGE ELASTIC 6 VELCRO ST LF (GAUZE/BANDAGES/DRESSINGS) ×1 IMPLANT
BANDAGE ESMARK 6X9 LF (GAUZE/BANDAGES/DRESSINGS) ×1 IMPLANT
BASEPLATE TIBIAL ROTATING SZ 4 (Knees) ×1 IMPLANT
BENZOIN TINCTURE PRP APPL 2/3 (GAUZE/BANDAGES/DRESSINGS) ×2 IMPLANT
BLADE SAGITTAL 25.0X1.19X90 (BLADE) ×2 IMPLANT
BLADE SAW SGTL 13X75X1.27 (BLADE) ×2 IMPLANT
BLADE SURG 10 STRL SS (BLADE) ×4 IMPLANT
BNDG CMPR 9X6 STRL LF SNTH (GAUZE/BANDAGES/DRESSINGS) ×1
BNDG CMPR MED 15X6 ELC VLCR LF (GAUZE/BANDAGES/DRESSINGS) ×1
BNDG ELASTIC 6X15 VLCR STRL LF (GAUZE/BANDAGES/DRESSINGS) ×2 IMPLANT
BNDG ESMARK 6X9 LF (GAUZE/BANDAGES/DRESSINGS) ×2
BOWL SMART MIX CTS (DISPOSABLE) ×2 IMPLANT
BSPLAT TIB 4 CMNT ROT PLAT STR (Knees) ×1 IMPLANT
CEMENT HV SMART SET (Cement) ×4 IMPLANT
CLSR STERI-STRIP ANTIMIC 1/2X4 (GAUZE/BANDAGES/DRESSINGS) ×1 IMPLANT
COVER SURGICAL LIGHT HANDLE (MISCELLANEOUS) ×2 IMPLANT
COVER WAND RF STERILE (DRAPES) ×2 IMPLANT
CUFF TOURNIQUET SINGLE 34IN LL (TOURNIQUET CUFF) ×2 IMPLANT
CUFF TOURNIQUET SINGLE 44IN (TOURNIQUET CUFF) IMPLANT
DECANTER SPIKE VIAL GLASS SM (MISCELLANEOUS) ×2 IMPLANT
DRAPE EXTREMITY T 121X128X90 (DRAPE) ×2 IMPLANT
DRAPE HALF SHEET 40X57 (DRAPES) ×4 IMPLANT
DRAPE INCISE IOBAN 66X45 STRL (DRAPES) IMPLANT
DRAPE ORTHO SPLIT 77X108 STRL (DRAPES) ×2
DRAPE SURG ORHT 6 SPLT 77X108 (DRAPES) ×1 IMPLANT
DRAPE U-SHAPE 47X51 STRL (DRAPES) ×2 IMPLANT
DRESSING AQUACEL AG SP 3.5X10 (GAUZE/BANDAGES/DRESSINGS) IMPLANT
DRSG AQUACEL AG ADV 3.5X10 (GAUZE/BANDAGES/DRESSINGS) ×2 IMPLANT
DRSG AQUACEL AG SP 3.5X10 (GAUZE/BANDAGES/DRESSINGS) ×2
DURAPREP 26ML APPLICATOR (WOUND CARE) ×2 IMPLANT
ELECT CAUTERY BLADE 6.4 (BLADE) ×2 IMPLANT
ELECT REM PT RETURN 9FT ADLT (ELECTROSURGICAL) ×2
ELECTRODE REM PT RTRN 9FT ADLT (ELECTROSURGICAL) ×1 IMPLANT
FACESHIELD WRAPAROUND (MASK) ×4 IMPLANT
FACESHIELD WRAPAROUND OR TEAM (MASK) ×1 IMPLANT
GLOVE BIO SURGEON STRL SZ7 (GLOVE) ×2 IMPLANT
GLOVE BIOGEL PI IND STRL 7.0 (GLOVE) ×1 IMPLANT
GLOVE BIOGEL PI IND STRL 7.5 (GLOVE) ×1 IMPLANT
GLOVE BIOGEL PI INDICATOR 7.0 (GLOVE) ×1
GLOVE BIOGEL PI INDICATOR 7.5 (GLOVE) ×1
GLOVE SS BIOGEL STRL SZ 7.5 (GLOVE) ×1 IMPLANT
GLOVE SUPERSENSE BIOGEL SZ 7.5 (GLOVE) ×1
GOWN STRL REUS W/ TWL LRG LVL3 (GOWN DISPOSABLE) ×1 IMPLANT
GOWN STRL REUS W/ TWL XL LVL3 (GOWN DISPOSABLE) ×1 IMPLANT
GOWN STRL REUS W/TWL LRG LVL3 (GOWN DISPOSABLE) ×2
GOWN STRL REUS W/TWL XL LVL3 (GOWN DISPOSABLE) ×2
HANDPIECE INTERPULSE COAX TIP (DISPOSABLE) ×2
HOOD PEEL AWAY FACE SHEILD DIS (HOOD) ×4 IMPLANT
IMMOBILIZER KNEE 22 UNIV (SOFTGOODS) ×2 IMPLANT
KIT BASIN OR (CUSTOM PROCEDURE TRAY) ×2 IMPLANT
KIT TURNOVER KIT B (KITS) ×2 IMPLANT
MANIFOLD NEPTUNE II (INSTRUMENTS) ×2 IMPLANT
MARKER SKIN DUAL TIP RULER LAB (MISCELLANEOUS) ×2 IMPLANT
NEEDLE HYPO 22GX1.5 SAFETY (NEEDLE) ×4 IMPLANT
NS IRRIG 1000ML POUR BTL (IV SOLUTION) ×2 IMPLANT
PACK TOTAL JOINT (CUSTOM PROCEDURE TRAY) ×2 IMPLANT
PAD ARMBOARD 7.5X6 YLW CONV (MISCELLANEOUS) ×4 IMPLANT
PIN STEINMAN FIXATION KNEE (PIN) ×1 IMPLANT
PIN THREADED HEADED SIGMA (PIN) ×1 IMPLANT
SET HNDPC FAN SPRY TIP SCT (DISPOSABLE) ×1 IMPLANT
STRIP CLOSURE SKIN 1/2X4 (GAUZE/BANDAGES/DRESSINGS) ×2 IMPLANT
SUCTION FRAZIER HANDLE 10FR (MISCELLANEOUS) ×1
SUCTION TUBE FRAZIER 10FR DISP (MISCELLANEOUS) ×1 IMPLANT
SUT MNCRL AB 3-0 PS2 18 (SUTURE) ×2 IMPLANT
SUT VIC AB 0 CT1 27 (SUTURE) ×4
SUT VIC AB 0 CT1 27XBRD ANBCTR (SUTURE) ×2 IMPLANT
SUT VIC AB 1 CT1 27 (SUTURE) ×2
SUT VIC AB 1 CT1 27XBRD ANBCTR (SUTURE) ×1 IMPLANT
SUT VIC AB 2-0 CT1 27 (SUTURE) ×4
SUT VIC AB 2-0 CT1 TAPERPNT 27 (SUTURE) ×2 IMPLANT
SYR CONTROL 10ML LL (SYRINGE) ×4 IMPLANT
TOWEL OR 17X24 6PK STRL BLUE (TOWEL DISPOSABLE) ×2 IMPLANT
TOWEL OR 17X26 10 PK STRL BLUE (TOWEL DISPOSABLE) ×2 IMPLANT
TRAY CATH 16FR W/PLASTIC CATH (SET/KITS/TRAYS/PACK) IMPLANT
TRAY FOLEY CATH SILVER 16FR (SET/KITS/TRAYS/PACK) ×2 IMPLANT
WATER STERILE IRR 1000ML POUR (IV SOLUTION) ×2 IMPLANT

## 2017-12-26 NOTE — Progress Notes (Signed)
Called Dr. Ermalene Postin regarding patients BP. Initial [id number]. Rechecked after 15 minutes- 205/89. Per Dr. Ermalene Postin, continue to monitor.

## 2017-12-26 NOTE — Transfer of Care (Signed)
Immediate Anesthesia Transfer of Care Note  Patient: [name]  Procedure(s) Performed: RIGHT TOTAL KNEE ARTHROPLASTY (Right Knee)  Patient Location: PACU  Anesthesia Type:MAC and Spinal  Level of Consciousness: awake, oriented and patient cooperative  Airway & Oxygen Therapy: Patient Spontanous Breathing and Patient connected to face mask oxygen  Post-op Assessment: Report given to RN and Post -op Vital signs reviewed and stable  Post vital signs: Reviewed  Last Vitals:  Vitals Value Taken Time  BP 142/76 12/26/2017  9:24 AM  Temp    Pulse 107 12/26/2017  9:26 AM  Resp 14 12/26/2017  9:26 AM  SpO2 100 % 12/26/2017  9:26 AM  Vitals shown include unvalidated device data.  Last Pain:  Vitals:   12/26/17 0559  TempSrc:   PainSc: 0-No pain         Complications: No apparent anesthesia complications

## 2017-12-26 NOTE — Op Note (Signed)
MRN:     [id number] DOB/AGE:    66-Apr-1952 / 66 y.o.       OPERATIVE REPORT   DATE OF PROCEDURE:  12/26/2017      PREOPERATIVE DIAGNOSIS:   Primary Localized Osteoarthritis right Knee       Estimated body mass index is 35.51 kg/m as calculated from the following:   Height as of this encounter: 5\' 6"  (1.676 m).   Weight as of this encounter: 99.8 kg.                                                       POSTOPERATIVE DIAGNOSIS:   Same                                                                 PROCEDURE:  Procedure(s): RIGHT TOTAL KNEE ARTHROPLASTY Using Depuy Attune RP implants #4 Femur, #4Tibia, 2mm  RP bearing, 32 Patella    SURGEON: Shawntelle Ungar A. Noemi Chapel, MD   ASSISTANT: Matthew Saras, PA-C, present and scrubbed throughout the case, critical for retraction, instrumentation, and closure.  ANESTHESIA: Spinal with Adductor Nerve Block  TOURNIQUET TIME: 54 minutes   COMPLICATIONS:  None       SPECIMENS: None   INDICATIONS FOR PROCEDURE: The patient has djd of the knee with varus deformities, XR shows bone on bone arthritis. Patient has failed all conservative measures including anti-inflammatory medicines, narcotics, attempts at exercise and weight loss, cortisone injections and viscosupplementation.  Risks and benefits of surgery have been discussed, questions answered.    DESCRIPTION OF PROCEDURE: The patient identified by armband, received right adductor canal block and IV antibiotics, in the holding area at [facility]. Patient taken to the operating room, appropriate anesthetic monitors were attached. Spinal anesthesia induced with the patient in supine position, Foley catheter was inserted. Tourniquet applied high to the operative thigh. Lateral post and foot positioner applied to the table, the lower extremity was then prepped and draped in usual sterile fashion from the ankle to the tourniquet. Time-out procedure was performed. The limb was wrapped with an Esmarch  bandage and the tourniquet inflated to 365 mmHg.   We began the operation by making a 6cm anterior midline incision. Small bleeders in the skin and the subcutaneous tissue identified and cauterized. Transverse retinaculum was incised and reflected medially and a medial parapatellar arthrotomy was accomplished. the patella was everted and theprepatellar fat pad resected. The superficial medial collateral ligament was then elevated from anterior to posterior along the proximal flare of the tibia and anterior half of the menisci resected. The knee was hyperflexed exposing bone on bone arthritis. Peripheral and notch osteophytes as well as the cruciate ligaments were then resected. We continued to work our way around posteriorly along the proximal tibia, and externally rotated the tibia subluxing it out from underneath the femur. A McHale retractor was placed through the notch and a lateral Hohmann retractor placed, and an external tibial guide was placed.  The tibial cutting guide was pinned into place allowing resection of 4 mm of bone medially and about 6 mm of bone laterally because of  her varus deformity.   Satisfied with the tibial resection, we then entered the distal femur 2 mm anterior to the PCL origin with the intramedullary guide rod and applied the distal femoral cutting guide set at 72mm, with 5 degrees of valgus. This was pinned along the epicondylar axis. At this point, the distal femoral cut was accomplished without difficulty. We then sized for a 4 femoral component and pinned the guide in 3 degrees of external rotation.The chamfer cutting guide was pinned into place. The anterior, posterior, and chamfer cuts were accomplished without difficulty followed by the  RP box cutting guide and the box cut. We also removed posterior osteophytes from the posterior femoral condyles. At this time, the knee was brought into full extension. We checked our extension and flexion gaps and found them symmetric at  6.  The patella thickness measured at 41m m. We set the cutting guide at 15 and removed the posterior patella sized for 32 button and drilled the lollipop. The knee was then once again hyperflexed exposing the proximal tibia. We sized for a # 4 tibial base plate, applied the smokestack and the conical reamer followed by the the Delta fin keel punch. We then hammered into place the  RP trial femoral component, inserted a trial bearing, trial patellar button, and took the knee through range of motion from 0-130 degrees. No thumb pressure was required for patellar tracking.   At this point, all trial components were removed, a double batch of DePuy HV cement  was mixed and applied to all bony metallic mating surfaces. In order, we hammered into place the tibial tray and removed excess cement, the femoral component and removed excess cement, a 6 mm  RP bearing was inserted, and the knee brought to full extension with compression. The patellar button was clamped into place, and excess cement removed. While the cement cured the wound was irrigated out with normal saline solution pulse lavage, and exparel was injected throughout the knee. Ligament stability and patellar tracking were checked and found to be excellent..   The parapatellar arthrotomy was closed with  #1 Vicryl suture. The subcutaneous tissue with 0 and 2-0 undyed Vicryl suture, and 4-0 Monocryl.. A dressing of Aquaseal, 4 x 4, dressing sponges, Webril, and Ace wrap applied. Needle and sponge count were correct times 2.The patient awakened, extubated, and taken to recovery room without difficulty. Vascular status was normal, pulses 2+ and symmetric.    Lorn Junes 04/25/2017, 8:56 AM

## 2017-12-26 NOTE — Anesthesia Procedure Notes (Signed)
Procedure Name: MAC Date/Time: 12/26/2017 7:25 AM Performed by: Jenne Campus, CRNA Pre-anesthesia Checklist: Patient identified, Emergency Drugs available, Suction available and Patient being monitored Oxygen Delivery Method: Simple face mask

## 2017-12-26 NOTE — Anesthesia Postprocedure Evaluation (Signed)
Anesthesia Post Note  Patient: [name]  Procedure(s) Performed: RIGHT TOTAL KNEE ARTHROPLASTY (Right Knee)     Patient location during evaluation: Nursing Unit Anesthesia Type: Spinal and Regional Level of consciousness: oriented and awake and alert Pain management: pain level controlled Vital Signs Assessment: post-procedure vital signs reviewed and stable Respiratory status: spontaneous breathing and respiratory function stable Cardiovascular status: blood pressure returned to baseline and stable Postop Assessment: no headache, no backache, no apparent nausea or vomiting and patient able to bend at knees Anesthetic complications: no Comments: w adductor canal block    Last Vitals:  Vitals:   12/26/17 1036 12/26/17 1037  BP: (!) 173/100 (!) 171/95  Pulse: (!) 107 (!) 107  Resp:    Temp: 36.9 C   SpO2: 97%     Last Pain:  Vitals:   12/26/17 1037  TempSrc:   PainSc: 0-No pain                 Barnet Glasgow

## 2017-12-26 NOTE — Anesthesia Procedure Notes (Signed)
Spinal  Patient location during procedure: OR Start time: 12/26/2017 7:16 AM End time: 12/26/2017 7:22 AM Staffing Anesthesiologist: Barnet Glasgow, MD Spinal Block Patient position: sitting Prep: DuraPrep Patient monitoring: heart rate, cardiac monitor, continuous pulse ox and blood pressure Approach: midline Location: L2-3 Needle Needle gauge: 24 G Needle length: 10 cm Needle insertion depth: 7 cm Additional Notes 2  Attempts. Pt tolerated procedure well.

## 2017-12-26 NOTE — Interval H&P Note (Signed)
History and Physical Interval Note:  12/26/2017 6:33 AM  [name]  has presented today for surgery, with the diagnosis of djd right knee  The various methods of treatment have been discussed with the patient and family. After consideration of risks, benefits and other options for treatment, the patient has consented to  Procedure(s): RIGHT TOTAL KNEE ARTHROPLASTY (Right) as a surgical intervention .  The patient's history has been reviewed, patient examined, no change in status, stable for surgery.  I have reviewed the patient's chart and labs.  Questions were answered to the patient's satisfaction.     Lorn Junes

## 2017-12-26 NOTE — Progress Notes (Signed)
Orthopedic Tech Progress Note Patient Details:  [name] 1950/02/18 [id number]  CPM Right Knee CPM Right Knee: On Right Knee Flexion (Degrees): 90 Right Knee Extension (Degrees): 0 Additional Comments: foot roll  Post Interventions Patient Tolerated: Well  [name] 12/26/2017, 9:42 AM

## 2017-12-26 NOTE — Care Plan (Signed)
Ortho Bundle Case Management Note  Patient Details  Name: [name] MRN: [id number] Date of Birth: 07/13/1950    Spoke with patient. She will discharge to home with family and HHPT. 3n1 and CPM ordered t                 DME Arranged:  Bedside commode, CPM DME Agency:  [employer]  HH Arranged:  PT [place] Agency:  [facility] ([facility]  Additional Comments: Please contact me with any questions of if this plan should need to change.  Ladell Heads,  [place] [facility]  [phone] 12/26/2017, 3:34 PM

## 2017-12-26 NOTE — Anesthesia Procedure Notes (Signed)
Anesthesia Regional Block: Adductor canal block   Pre-Anesthetic Checklist: ,, timeout performed, Correct Patient, Correct Site, Correct Laterality, Correct Procedure, Correct Position, site marked, Risks and benefits discussed,  Surgical consent,  Pre-op evaluation,  At surgeon's request and post-op pain management  Laterality: Right  Prep: Maximum Sterile Barrier Precautions used, chloraprep       Needles:  Injection technique: Single-shot  Needle Type: Echogenic Needle     Needle Length: 9cm  Needle Gauge: 21     Additional Needles:   Procedures:,,,, ultrasound used (permanent image in chart),,,,  Narrative:  Start time: 12/26/2017 6:53 AM End time: 12/26/2017 6:58 AM Injection made incrementally with aspirations every 5 mL.  Performed by: Personally  Anesthesiologist: Barnet Glasgow, MD  Additional Notes: 1 attempt. Pt tolerated procedure well.

## 2017-12-26 NOTE — Evaluation (Signed)
Physical Therapy Evaluation Patient Details Name: [name] MRN: [id number] DOB: Jul 24, 1950 Today's Date: 12/26/2017   History of Present Illness  Pt is a 67 y/o female s/p elective R TKA. PMH includes R breast cancer s/p lumpectomy, and HTN.   Clinical Impression  Pt is s/p surgery above with deficits below. Pt overall tolerated gait training well this session. Required min guard A for mobility using RW. Educated about knee precautions and supine HEP. Will continue to follow acutely to maximize functional mobility independence and safety.     Follow Up Recommendations Follow surgeon's recommendation for DC plan and follow-up therapies;Supervision for mobility/OOB    Equipment Recommendations  3in1 (PT)    Recommendations for Other Services       Precautions / Restrictions Precautions Precautions: Knee Precaution Booklet Issued: Yes (comment) Precaution Comments: Reviewed knee precautions with pt.  Required Braces or Orthoses: Knee Immobilizer - Right Knee Immobilizer - Right: Other (comment)(until discontinued) Restrictions Weight Bearing Restrictions: Yes RLE Weight Bearing: Weight bearing as tolerated      Mobility  Bed Mobility Overal bed mobility: Needs Assistance Bed Mobility: Supine to Sit     Supine to sit: Supervision     General bed mobility comments: Supervision for safety. Increased time required.   Transfers Overall transfer level: Needs assistance Equipment used: Rolling walker (2 wheeled) Transfers: Sit to/from Stand Sit to Stand: Min guard         General transfer comment: Min guard for safety. Cues for safe hand placement.   Ambulation/Gait Ambulation/Gait assistance: Min guard Gait Distance (Feet): 100 Feet Assistive device: Rolling walker (2 wheeled) Gait Pattern/deviations: Step-to pattern;Step-through pattern;Decreased step length - right;Decreased step length - left;Decreased weight shift to right;Antalgic Gait velocity: Decreased     General Gait Details: Slow, antalgic gait. Able to progress to step through pattern, however, demonstrated decreased weightshift to RLE. Cues for sequencing using RW.   Stairs            Wheelchair Mobility    Modified Rankin (Stroke Patients Only)       Balance Overall balance assessment: Needs assistance Sitting-balance support: Feet supported;No upper extremity supported Sitting balance-Leahy Scale: Good     Standing balance support: Bilateral upper extremity supported;During functional activity Standing balance-Leahy Scale: Poor Standing balance comment: Reliant on BUE support.                              Pertinent Vitals/Pain Pain Assessment: Faces Faces Pain Scale: Hurts little more Pain Location: R knee  Pain Descriptors / Indicators: Aching;Operative site guarding Pain Intervention(s): Limited activity within patient's tolerance;Monitored during session;Repositioned    Home Living Family/patient expects to be discharged to:: Private residence Living Arrangements: Spouse/significant other Available Help at Discharge: Family Type of Home: House Home Access: Stairs to enter Entrance Stairs-Rails: Right Entrance Stairs-Number of Steps: 14 Home Layout: Two level Home Equipment: Environmental consultant - 2 wheels      Prior Function Level of Independence: Independent               Hand Dominance        Extremity/Trunk Assessment   Upper Extremity Assessment Upper Extremity Assessment: Overall WFL for tasks assessed    Lower Extremity Assessment Lower Extremity Assessment: RLE deficits/detail RLE Deficits / Details: Deficits consistent with post op pain and weakness. Able to perform ther ex below.     Cervical / Trunk Assessment Cervical / Trunk Assessment: Normal  Communication  Communication: No difficulties  Cognition Arousal/Alertness: Awake/alert Behavior During Therapy: WFL for tasks assessed/performed Overall Cognitive Status:  Within Functional Limits for tasks assessed                                        General Comments General comments (skin integrity, edema, etc.): Pt's husband and daughter present throughout session.     Exercises Total Joint Exercises Ankle Circles/Pumps: AROM;Both;20 reps Quad Sets: AROM;Right;10 reps Heel Slides: AROM;Right;10 reps   Assessment/Plan    PT Assessment Patient needs continued PT services  PT Problem List Decreased strength;Decreased range of motion;Decreased balance;Decreased mobility;Decreased knowledge of use of DME;Decreased knowledge of precautions;Pain       PT Treatment Interventions DME instruction;Gait training;Stair training;Functional mobility training;Therapeutic activities;Therapeutic exercise;Balance training;Patient/family education    PT Goals (Current goals can be found in the Care Plan section)  Acute Rehab PT Goals Patient Stated Goal: to go home tomorrow PT Goal Formulation: With patient Time For Goal Achievement: 01/09/18 Potential to Achieve Goals: Good    Frequency 7X/week   Barriers to discharge        Co-evaluation               AM-PAC PT "6 Clicks" Mobility  Outcome Measure Help needed turning from your back to your side while in a flat bed without using bedrails?: None Help needed moving from lying on your back to sitting on the side of a flat bed without using bedrails?: None Help needed moving to and from a bed to a chair (including a wheelchair)?: A Little Help needed standing up from a chair using your arms (e.g., wheelchair or bedside chair)?: A Little Help needed to walk in hospital room?: A Little Help needed climbing 3-5 steps with a railing? : A Lot 6 Click Score: 19    End of Session Equipment Utilized During Treatment: Gait belt Activity Tolerance: Patient tolerated treatment well Patient left: in chair;with call bell/phone within reach;with family/visitor present Nurse Communication:  Mobility status PT Visit Diagnosis: Other abnormalities of gait and mobility (R26.89);Muscle weakness (generalized) (M62.81);Pain Pain - Right/Left: Right Pain - part of body: Knee    Time: 1321-1346 PT Time Calculation (min) (ACUTE ONLY): 25 min   Charges:   PT Evaluation $PT Eval Low Complexity: 1 Low PT Treatments $Gait Training: 8-22 mins        Leighton Ruff, PT, DPT  Acute Rehabilitation Services  Pager: [phone] Office: [phone]   Rudean Hitt 12/26/2017, 1:54 PM

## 2017-12-27 ENCOUNTER — Encounter (HOSPITAL_COMMUNITY): Payer: Self-pay | Admitting: Orthopedic Surgery

## 2017-12-27 LAB — BASIC METABOLIC PANEL
ANION GAP: 9 (ref 5–15)
BUN: 9 mg/dL (ref 8–23)
CHLORIDE: 107 mmol/L (ref 98–111)
CO2: 23 mmol/L (ref 22–32)
Calcium: 9.6 mg/dL (ref 8.9–10.3)
Creatinine, Ser: 0.78 mg/dL (ref 0.44–1.00)
GFR calc Af Amer: >60 mL/min (ref >60)
GLUCOSE: 164 mg/dL — AB (ref 70–99)
POTASSIUM: 3.8 mmol/L (ref 3.5–5.1)
Sodium: 139 mmol/L (ref 135–145)

## 2017-12-27 LAB — CBC
HCT: 35 % — ABNORMAL LOW (ref 36.0–46.0)
Hemoglobin: 11.6 g/dL — ABNORMAL LOW (ref 12.0–15.0)
MCH: 30.3 pg (ref 26.0–34.0)
MCHC: 33.1 g/dL (ref 30.0–36.0)
MCV: 91.4 fL (ref 80.0–100.0)
PLATELETS: 278 10*3/uL (ref 150–400)
RBC: 3.83 MIL/uL — ABNORMAL LOW (ref 3.87–5.11)
RDW: 13.3 % (ref 11.5–15.5)
WBC: 13.7 10*3/uL — AB (ref 4.0–10.5)
nRBC: 0 % (ref 0.0–0.2)

## 2017-12-27 MED ORDER — FUROSEMIDE 20 MG PO TABS
20.0000 mg | ORAL_TABLET | Freq: Two times a day (BID) | ORAL | Status: DC
  Administered 2017-12-27 – 2017-12-28 (×2): 20 mg via ORAL
  Filled 2017-12-27 (×2): qty 1

## 2017-12-27 NOTE — Progress Notes (Signed)
Physical Therapy Treatment Patient Details Name: [name] MRN: [id number] DOB: 1950-08-24 Today's Date: 12/27/2017    History of Present Illness Pt is a 67 y/o female s/p elective R TKA. PMH includes R breast cancer s/p lumpectomy, and HTN.     PT Comments    Patient continues to make progress toward PT goals and tolerated session well. Pt able to complete HEP exercises and handout reviewed. Current plan remains appropriate.    Follow Up Recommendations  Follow surgeon's recommendation for DC plan and follow-up therapies;Supervision for mobility/OOB     Equipment Recommendations  3in1 (PT)    Recommendations for Other Services       Precautions / Restrictions Precautions Precautions: Knee Precaution Comments: Reviewed knee precautions/positioning with pt.  Restrictions Weight Bearing Restrictions: Yes RLE Weight Bearing: Weight bearing as tolerated    Mobility  Bed Mobility               General bed mobility comments: pt OOB in chair upon arrival  Transfers Overall transfer level: Needs assistance Equipment used: Rolling walker (2 wheeled) Transfers: Sit to/from Stand Sit to Stand: Min guard         General transfer comment: Min guard for safety; good technique and hand placement demonstrated  Ambulation/Gait Ambulation/Gait assistance: Supervision Gait Distance (Feet): 120 Feet Assistive device: Rolling walker (2 wheeled) Gait Pattern/deviations: Step-through pattern;Decreased stride length     General Gait Details: slow, steady gait; cues for increased stride length   Stairs             Wheelchair Mobility    Modified Rankin (Stroke Patients Only)       Balance Overall balance assessment: Needs assistance Sitting-balance support: Feet supported;No upper extremity supported Sitting balance-Leahy Scale: Good     Standing balance support: Bilateral upper extremity supported;During functional activity Standing balance-Leahy  Scale: Poor Standing balance comment: pt is able to static stand without UE support                            Cognition Arousal/Alertness: Awake/alert Behavior During Therapy: WFL for tasks assessed/performed Overall Cognitive Status: Within Functional Limits for tasks assessed                                        Exercises Total Joint Exercises Quad Sets: AROM;Both Short Arc Quad: AROM;Right;10 reps Heel Slides: AAROM;Right;10 reps Hip ABduction/ADduction: AROM;Right;10 reps Straight Leg Raises: AROM;Right;10 reps Long Arc Quad: AROM;Right;10 reps Knee Flexion: AROM;AAROM;Right;10 reps;Seated;Other (comment)(10 second holds) Goniometric ROM: 0-90    General Comments General comments (skin integrity, edema, etc.): husband and daughter present      Pertinent Vitals/Pain Pain Assessment: Faces Faces Pain Scale: Hurts little more Pain Location: R knee  Pain Descriptors / Indicators: Sore Pain Intervention(s): Limited activity within patient's tolerance;Monitored during session;Premedicated before session;Repositioned;Ice applied    Home Living                      Prior Function            PT Goals (current goals can now be found in the care plan section) Progress towards PT goals: Progressing toward goals    Frequency    7X/week      PT Plan Current plan remains appropriate    Co-evaluation  AM-PAC PT "6 Clicks" Mobility   Outcome Measure  Help needed turning from your back to your side while in a flat bed without using bedrails?: None Help needed moving from lying on your back to sitting on the side of a flat bed without using bedrails?: None Help needed moving to and from a bed to a chair (including a wheelchair)?: A Little Help needed standing up from a chair using your arms (e.g., wheelchair or bedside chair)?: A Little Help needed to walk in hospital room?: A Little Help needed climbing 3-5 steps  with a railing? : A Little 6 Click Score: 20    End of Session Equipment Utilized During Treatment: Gait belt Activity Tolerance: Patient tolerated treatment well Patient left: in chair;with call bell/phone within reach;with family/visitor present Nurse Communication: Mobility status PT Visit Diagnosis: Other abnormalities of gait and mobility (R26.89);Muscle weakness (generalized) (M62.81);Pain Pain - Right/Left: Right Pain - part of body: Knee     Time: 7614-7092 PT Time Calculation (min) (ACUTE ONLY): 31 min  Charges:  $Gait Training: 8-22 mins $Therapeutic Exercise: 8-22 mins                     Earney Navy, PTA Acute Rehabilitation Services Pager: [phone] Office: [phone]     Darliss Cheney 12/27/2017, 3:42 PM

## 2017-12-27 NOTE — Progress Notes (Signed)
Subjective: 1 Day Post-Op Procedure(s) (LRB): RIGHT TOTAL KNEE ARTHROPLASTY (Right) Patient reports pain as 5 on 0-10 scale.    Objective: Vital signs in last 24 hours: Temp:  [97.7 F (36.5 C)-98.4 F (36.9 C)] 98 F (36.7 C) (11/26 0511) Pulse Rate:  [93-116] 93 (11/26 0511) Resp:  [18-22] 18 (11/26 0511) BP: (142-208)/(72-100) 151/72 (11/26 0511) SpO2:  [94 %-100 %] 95 % (11/26 0511)  Intake/Output from previous day: 11/25 0701 - 11/26 0700 In: 2014.3 [I.V.:1814.3; IV Piggyback:200] Out: 4150 [Urine:4100; Blood:50] Intake/Output this shift: No intake/output data recorded.  Recent Labs    12/27/17 0351  HGB 11.6*   Recent Labs    12/27/17 0351  WBC 13.7*  RBC 3.83*  HCT 35.0*  PLT 278   Recent Labs    12/27/17 0351  NA 139  K 3.8  CL 107  CO2 23  BUN 9  CREATININE 0.78  GLUCOSE 164*  CALCIUM 9.6   No results for input(s): LABPT, INR in the last 72 hours.  ABD soft Neurovascular intact Sensation intact distally Intact pulses distally Dorsiflexion/Plantar flexion intact Incision: scant drainage  Anticipated LOS equal to or greater than 2 midnights due to - Age 66 and older with one or more of the following:  - Obesity  - Expected need for hospital services (PT, OT, Nursing) required for safe  discharge  - Anticipated need for postoperative skilled nursing care or inpatient rehab  - Active co-morbidities: None OR   - Unanticipated findings during/Post Surgery: Slow post-op progression: GI, pain control, mobility  - Patient is a high risk of re-admission due to: None   Assessment/Plan: 1 Day Post-Op Procedure(s) (LRB): RIGHT TOTAL KNEE ARTHROPLASTY (Right)  Principal Problem:   Primary localized osteoarthritis of right knee Active Problems:   Breast cancer, right breast ([place])   Essential hypertension   History of appendicitis   Severe obesity (BMI 35.0-39.9) with comorbidity ([place])   Stasis dermatitis of both legs   History of right breast  cancer  Difficulty with spiking hypertension and easy fatigability with ambulation this am.  Will keep patient one more night so blood pressure can stabilize and endurance can improved Advance diet Up with therapy D/C IV fluids Plan for discharge tomorrow Discharge home with home health    Linda Hedges 12/27/2017, 8:42 AM

## 2017-12-27 NOTE — Progress Notes (Signed)
Physical Therapy Treatment Patient Details Name: [name] MRN: [id number] DOB: [birth year] Today's Date: 12/27/2017    History of Present Illness Pt is a 67 y/o female s/p elective R TKA. PMH includes R breast cancer s/p lumpectomy, and HTN.     PT Comments    Patient is progressing well toward PT goals. Pt tolerated gait and stair training this session with supervision/min guard assist for safety. Pt with c/o 5/10 pain with mobility. Continue to progress as tolerated.    Follow Up Recommendations  Follow surgeon's recommendation for DC plan and follow-up therapies;Supervision for mobility/OOB     Equipment Recommendations  3in1 (PT)    Recommendations for Other Services       Precautions / Restrictions Precautions Precautions: Knee Precaution Comments: Reviewed knee precautions/positioning with pt.  Required Braces or Orthoses: Knee Immobilizer - Right Knee Immobilizer - Right: Other (comment)(not use this session) Restrictions Weight Bearing Restrictions: Yes RLE Weight Bearing: Weight bearing as tolerated    Mobility  Bed Mobility               General bed mobility comments: pt OOB in chair upon arrival  Transfers Overall transfer level: Needs assistance Equipment used: Rolling walker (2 wheeled) Transfers: Sit to/from Stand Sit to Stand: Min guard         General transfer comment: Min guard for safety; good technique and hand placement demonstrated  Ambulation/Gait Ambulation/Gait assistance: Min guard;Supervision Gait Distance (Feet): 200 Feet Assistive device: Rolling walker (2 wheeled) Gait Pattern/deviations: Step-through pattern;Decreased stride length;Decreased weight shift to right     General Gait Details: cues for posture; good step through pattern; decreased cadence   Stairs Stairs: Yes Stairs assistance: Min guard Stair Management: One rail Right;Step to pattern;Sideways Number of Stairs: 12 General stair comments: cues for  sequencing and technique; no knee instability noted   Wheelchair Mobility    Modified Rankin (Stroke Patients Only)       Balance Overall balance assessment: Needs assistance Sitting-balance support: Feet supported;No upper extremity supported Sitting balance-Leahy Scale: Good     Standing balance support: Bilateral upper extremity supported;During functional activity Standing balance-Leahy Scale: Poor                              Cognition Arousal/Alertness: Awake/alert Behavior During Therapy: WFL for tasks assessed/performed Overall Cognitive Status: Within Functional Limits for tasks assessed                                        Exercises      General Comments General comments (skin integrity, edema, etc.): husband and daughter present thoughout       Pertinent Vitals/Pain Pain Assessment: 0-10 Pain Score: 5  Pain Location: R knee  Pain Descriptors / Indicators: Sore Pain Intervention(s): Limited activity within patient's tolerance;Monitored during session;Premedicated before session;Repositioned    Home Living                      Prior Function            PT Goals (current goals can now be found in the care plan section) Progress towards PT goals: Progressing toward goals    Frequency    7X/week      PT Plan Current plan remains appropriate    Co-evaluation  AM-PAC PT "6 Clicks" Mobility   Outcome Measure  Help needed turning from your back to your side while in a flat bed without using bedrails?: None Help needed moving from lying on your back to sitting on the side of a flat bed without using bedrails?: None Help needed moving to and from a bed to a chair (including a wheelchair)?: A Little Help needed standing up from a chair using your arms (e.g., wheelchair or bedside chair)?: A Little Help needed to walk in hospital room?: A Little Help needed climbing 3-5 steps with a railing? :  A Little 6 Click Score: 20    End of Session Equipment Utilized During Treatment: Gait belt Activity Tolerance: Patient tolerated treatment well Patient left: in chair;with call bell/phone within reach;with family/visitor present Nurse Communication: Mobility status PT Visit Diagnosis: Other abnormalities of gait and mobility (R26.89);Muscle weakness (generalized) (M62.81);Pain Pain - Right/Left: Right Pain - part of body: Knee     Time: 8938-1017 PT Time Calculation (min) (ACUTE ONLY): 22 min  Charges:  $Gait Training: 8-22 mins                     Earney Navy, PTA Acute Rehabilitation Services Pager: [phone] Office: [phone]     Darliss Cheney 12/27/2017, 10:33 AM

## 2017-12-27 NOTE — Care Management (Signed)
Patient is ORTHO BUNDLE, discharge care arranged by office CM. This CM ordered 3in1 from [facility] for patient, [facility] unable to deliver d/t illness of staff.

## 2017-12-27 NOTE — Plan of Care (Signed)

## 2017-12-28 LAB — CBC
HEMATOCRIT: 34.8 % — AB (ref 36.0–46.0)
HEMOGLOBIN: 11.5 g/dL — AB (ref 12.0–15.0)
MCH: 30.4 pg (ref 26.0–34.0)
MCHC: 33 g/dL (ref 30.0–36.0)
MCV: 92.1 fL (ref 80.0–100.0)
Platelets: 292 10*3/uL (ref 150–400)
RBC: 3.78 MIL/uL — AB (ref 3.87–5.11)
RDW: 13.5 % (ref 11.5–15.5)
WBC: 15.8 10*3/uL — ABNORMAL HIGH (ref 4.0–10.5)
nRBC: 0 % (ref 0.0–0.2)

## 2017-12-28 LAB — BASIC METABOLIC PANEL
ANION GAP: 8 (ref 5–15)
BUN: 13 mg/dL (ref 8–23)
CHLORIDE: 105 mmol/L (ref 98–111)
CO2: 26 mmol/L (ref 22–32)
Calcium: 9.8 mg/dL (ref 8.9–10.3)
Creatinine, Ser: 0.72 mg/dL (ref 0.44–1.00)
GFR calc Af Amer: >60 mL/min (ref >60)
GLUCOSE: 157 mg/dL — AB (ref 70–99)
POTASSIUM: 3.8 mmol/L (ref 3.5–5.1)
Sodium: 139 mmol/L (ref 135–145)

## 2017-12-28 MED ORDER — POLYETHYLENE GLYCOL 3350 17 G PO PACK: PACK | ORAL | 0 refills | Status: DC

## 2017-12-28 MED ORDER — ASPIRIN 325 MG PO TBEC: DELAYED_RELEASE_TABLET | ORAL | 0 refills | Status: DC

## 2017-12-28 MED ORDER — GABAPENTIN 600 MG PO TABS: 300.0000 mg | ORAL_TABLET | Freq: Every day | ORAL | 0 refills | Status: DC

## 2017-12-28 MED ORDER — OXYCODONE HCL 5 MG PO TABS: 5.0000 mg | ORAL_TABLET | ORAL | 0 refills | Status: DC | PRN

## 2017-12-28 MED ORDER — DOCUSATE SODIUM 100 MG PO CAPS: ORAL_CAPSULE | ORAL | 0 refills | Status: DC

## 2017-12-28 NOTE — Progress Notes (Signed)
Provided discharge education/instructions, all questions and concerns addressed, Pt not in distress, discharged home with belongings accompanied by family.

## 2017-12-28 NOTE — Plan of Care (Signed)

## 2017-12-28 NOTE — Discharge Summary (Signed)
Patient ID: [name] MRN: [id number] DOB/AGE: 08-26-1950 67 y.o.  Admit date: 12/26/2017 Discharge date: 12/28/2017  Admission Diagnoses:  Principal Problem:   Primary localized osteoarthritis of right knee Active Problems:   Breast cancer, right breast ([place])   Essential hypertension   History of appendicitis   Severe obesity (BMI 35.0-39.9) with comorbidity ([place])   Stasis dermatitis of both legs   History of right breast cancer   Discharge Diagnoses:  Same  Past Medical History:  Diagnosis Date  . Arthritis   . Breast cancer, right breast ([place]) 07/21/2011  . GERD (gastroesophageal reflux disease)   . Hypertension   . Personal history of radiation therapy 2007  . Pneumonia 1988   h/o  . PONV (postoperative nausea and vomiting)   . Primary localized osteoarthritis of right knee 12/26/2017    Surgeries: Procedure(s): RIGHT TOTAL KNEE ARTHROPLASTY on 12/26/2017   Consultants:   Discharged Condition: Improved  Hospital Course: [name] is an 67 y.o. female who was admitted 12/26/2017 for operative treatment ofPrimary localized osteoarthritis of right knee. Patient has severe unremitting pain that affects sleep, daily activities, and work/hobbies. After pre-op clearance the patient was taken to the operating room on 12/26/2017 and underwent  Procedure(s): RIGHT TOTAL KNEE ARTHROPLASTY.    Patient was given perioperative antibiotics:  Anti-infectives (From admission, onward)   Start     Dose/Rate Route Frequency Ordered Stop   12/26/17 1545  ceFAZolin (ANCEF) IVPB 2g/100 mL premix  Status:  Discontinued     2 g 200 mL/hr over 30 Minutes Intravenous Every 6 hours 12/26/17 1033 12/26/17 1040   12/26/17 1345  ceFAZolin (ANCEF) IVPB 2g/100 mL premix     2 g 200 mL/hr over 30 Minutes Intravenous Every 6 hours 12/26/17 1040 12/26/17 2331   12/26/17 0600  ceFAZolin (ANCEF) IVPB 2g/100 mL premix     2 g 200 mL/hr over 30 Minutes Intravenous On call to O.R. 12/26/17  7616 12/26/17 0723       Patient was given sequential compression devices, early ambulation, and chemoprophylaxis to prevent DVT.  Patient benefited maximally from hospital stay and there were no complications.    Recent vital signs:  Patient Vitals for the past 24 hrs:  BP Temp Temp src Pulse Resp SpO2  12/28/17 1018 (!) 167/83 97.8 F (36.6 C) Oral 94 18 98 %  12/28/17 0900 (!) 170/68 (!) 96.7 F (35.9 C) Rectal (!) 118 16 97 %  12/28/17 0504 (!) 163/83 98 F (36.7 C) Oral (!) 107 16 95 %  12/27/17 1933 (!) 174/91 98.2 F (36.8 C) Oral (!) 104 16 96 %  12/27/17 1419 (!) 160/73 97.9 F (36.6 C) Oral 98 - 95 %     Recent laboratory studies:  Recent Labs    12/27/17 0351 12/28/17 0206  WBC 13.7* 15.8*  HGB 11.6* 11.5*  HCT 35.0* 34.8*  PLT 278 292  NA 139 139  K 3.8 3.8  CL 107 105  CO2 23 26  BUN 9 13  CREATININE 0.78 0.72  GLUCOSE 164* 157*  CALCIUM 9.6 9.8     Discharge Medications:   Allergies as of 12/28/2017   No Known Allergies     Medication List    STOP taking these medications   ibuprofen 200 MG tablet Commonly known as:  ADVIL,MOTRIN   OSTEO BI-FLEX TRIPLE STRENGTH Tabs     TAKE these medications   ALPRAZolam 0.5 MG tablet Commonly known as:  XANAX Take 0.5 mg by  mouth daily as needed for anxiety.   aspirin 325 MG EC tablet 1 tab a day for the next 30 days to prevent blood clots   CALCIUM-MAGNESIUM-ZINC PO Take 1 tablet by mouth daily.   Cholecalciferol 125 MCG (5000 UT) Tabs Take 5,000 Units by mouth daily.   docusate sodium 100 MG capsule Commonly known as:  COLACE 1 tab 2 times a day while on narcotics.  STOOL SOFTENER   furosemide 20 MG tablet Commonly known as:  LASIX Take 20 mg by mouth 2 (two) times daily.   gabapentin 600 MG tablet Commonly known as:  NEURONTIN Take 0.5 tablets (300 mg total) by mouth at bedtime.   losartan 50 MG tablet Commonly known as:  COZAAR Take 50 mg by mouth daily.   multivitamin  tablet Take 1 tablet by mouth daily.   oxyCODONE 5 MG immediate release tablet Commonly known as:  Oxy IR/ROXICODONE Take 1-2 tablets (5-10 mg total) by mouth every 4 (four) hours as needed for moderate pain (pain score 4-6).   pantoprazole 40 MG tablet Commonly known as:  PROTONIX Take 40 mg by mouth daily.   polyethylene glycol packet Commonly known as:  MIRALAX / GLYCOLAX 17grams in 6 oz of something to drink twice a day until bowel movement.  LAXITIVE.  Restart if two days since last bowel movement   PROBIOTIC FORMULA PO Take 1 tablet by mouth daily.            Durable Medical Equipment  (From admission, onward)         Start     Ordered   12/27/17 1200  For home use only DME 3 n 1  Once     12/27/17 1200           Discharge Care Instructions  (From admission, onward)         Start     Ordered   12/28/17 0000  Change dressing    Comments:  DO NOT REMOVE BANDAGE OVER SURGICAL INCISION.  [place] WHOLE LEG INCLUDING OVER THE WATERPROOF BANDAGE WITH SOAP AND WATER EVERY DAY.   12/28/17 1208          Diagnostic Studies: No results found.  Disposition: Discharge disposition: 01-Home or Self Care       Discharge Instructions    CPM   Complete by:  As directed    Continuous passive motion machine (CPM):      Use the CPM from 0 to 90 for 6 hours per day.       You may break it up into 2 or 3 sessions per day.      Use CPM for 2 weeks or until you are told to stop.   Call MD / Call 911   Complete by:  As directed    If you experience chest pain or shortness of breath, CALL 911 and be transported to the hospital emergency room.  If you develope a fever above 101 F, pus (white drainage) or increased drainage or redness at the wound, or calf pain, call your surgeon's office.   Change dressing   Complete by:  As directed    DO NOT REMOVE BANDAGE OVER SURGICAL INCISION.  [place] WHOLE LEG INCLUDING OVER THE WATERPROOF BANDAGE WITH SOAP AND WATER EVERY DAY.    Constipation Prevention   Complete by:  As directed    Drink plenty of fluids.  Prune juice may be helpful.  You may use a stool softener, such as Colace (over  the counter) 100 mg twice a day.  Use MiraLax (over the counter) for constipation as needed.   Diet - low sodium heart healthy   Complete by:  As directed    Discharge instructions   Complete by:  As directed    INSTRUCTIONS AFTER JOINT REPLACEMENT   Remove items at home which could result in a fall. This includes throw rugs or furniture in walking pathways ICE to the affected joint every three hours while awake for 30 minutes at a time, for at least the first 3-5 days, and then as needed for pain and swelling.  Continue to use ice for pain and swelling. You may notice swelling that will progress down to the foot and ankle.  This is normal after surgery.  Elevate your leg when you are not up walking on it.   Continue to use the breathing machine you got in the hospital (incentive spirometer) which will help keep your temperature down.  It is common for your temperature to cycle up and down following surgery, especially at night when you are not up moving around and exerting yourself.  The breathing machine keeps your lungs expanded and your temperature down.   DIET:  As you were doing prior to hospitalization, we recommend a well-balanced diet.  DRESSING / WOUND CARE / SHOWERING  Keep the surgical dressing until follow up.  The dressing is water proof, so you can shower without any extra covering.  IF THE DRESSING FALLS OFF or the wound gets wet inside, change the dressing with sterile gauze.  Please use good hand washing techniques before changing the dressing.  Do not use any lotions or creams on the incision until instructed by your surgeon.    ACTIVITY  Increase activity slowly as tolerated, but follow the weight bearing instructions below.   No driving for 6 weeks or until further direction given by your physician.  You cannot  drive while taking narcotics.  No lifting or carrying greater than 10 lbs. until further directed by your surgeon. Avoid periods of inactivity such as sitting longer than an hour when not asleep. This helps prevent blood clots.  You may return to work once you are authorized by your doctor.     WEIGHT BEARING   Weight bearing as tolerated with assist device (walker, cane, etc) as directed, use it as long as suggested by your surgeon or therapist, typically at least 2-3 weeks.   EXERCISES  Results after joint replacement surgery are often greatly improved when you follow the exercise, range of motion and muscle strengthening exercises prescribed by your doctor. Safety measures are also important to protect the joint from further injury. Any time any of these exercises cause you to have increased pain or swelling, decrease what you are doing until you are comfortable again and then slowly increase them. If you have problems or questions, call your caregiver or physical therapist for advice.   Rehabilitation is important following a joint replacement. After just a few days of immobilization, the muscles of the leg can become weakened and shrink (atrophy).  These exercises are designed to build up the tone and strength of the thigh and leg muscles and to improve motion. Often times heat used for twenty to thirty minutes before working out will loosen up your tissues and help with improving the range of motion but do not use heat for the first two weeks following surgery (sometimes heat can increase post-operative swelling).   These exercises can be done  on a training (exercise) mat, on the floor, on a table or on a bed. Use whatever works the best and is most comfortable for you.    Use music or television while you are exercising so that the exercises are a pleasant break in your day. This will make your life better with the exercises acting as a break in your routine that you can look forward to.    Perform all exercises about fifteen times, three times per day or as directed.  You should exercise both the operative leg and the other leg as well.   Exercises include:  Quad Sets - Tighten up the muscle on the front of the thigh (Quad) and hold for 5-10 seconds.   Straight Leg Raises - With your knee straight (if you were given a brace, keep it on), lift the leg to 60 degrees, hold for 3 seconds, and slowly lower the leg.  Perform this exercise against resistance later as your leg gets stronger.  Leg Slides: Lying on your back, slowly slide your foot toward your buttocks, bending your knee up off the floor (only go as far as is comfortable). Then slowly slide your foot back down until your leg is flat on the floor again.  Angel Wings: Lying on your back spread your legs to the side as far apart as you can without causing discomfort.  Hamstring Strength:  Lying on your back, push your heel against the floor with your leg straight by tightening up the muscles of your buttocks.  Repeat, but this time bend your knee to a comfortable angle, and push your heel against the floor.  You may put a pillow under the heel to make it more comfortable if necessary.   A rehabilitation program following joint replacement surgery can speed recovery and prevent re-injury in the future due to weakened muscles. Contact your doctor or a physical therapist for more information on knee rehabilitation.    CONSTIPATION  Constipation is defined medically as fewer than three stools per week and severe constipation as less than one stool per week.  Even if you have a regular bowel pattern at home, your normal regimen is likely to be disrupted due to multiple reasons following surgery.  Combination of anesthesia, postoperative narcotics, change in appetite and fluid intake all can affect your bowels.   YOU MUST use at least one of the following options; they are listed in order of increasing strength to get the job done.   They are all available over the counter, and you may need to use some, POSSIBLY even all of these options:    Drink plenty of fluids (prune juice may be helpful) and high fiber foods Colace 100 mg by mouth twice a day  Senokot for constipation as directed and as needed Dulcolax (bisacodyl), take with full glass of water  Miralax (polyethylene glycol) once or twice a day as needed.  If you have tried all these things and are unable to have a bowel movement in the first 3-4 days after surgery call either your surgeon or your primary doctor.    If you experience loose stools or diarrhea, hold the medications until you stool forms back up.  If your symptoms do not get better within 1 week or if they get worse, check with your doctor.  If you experience "the worst abdominal pain ever" or develop nausea or vomiting, please contact the office immediately for further recommendations for treatment.   ITCHING:  If you experience  itching with your medications, try taking only a single pain pill, or even half a pain pill at a time.  You can also use Benadryl over the counter for itching or also to help with sleep.   TED HOSE STOCKINGS:  Use stockings on both legs until for at least 2 weeks or as directed by physician office. They may be removed at night for sleeping.  MEDICATIONS:  See your medication summary on the "After Visit Summary" that nursing will review with you.  You may have some home medications which will be placed on hold until you complete the course of blood thinner medication.  It is important for you to complete the blood thinner medication as prescribed.  PRECAUTIONS:  If you experience chest pain or shortness of breath - call 911 immediately for transfer to the hospital emergency department.   If you develop a fever greater that 101 F, purulent drainage from wound, increased redness or drainage from wound, foul odor from the wound/dressing, or calf pain - CONTACT YOUR SURGEON.                                                    FOLLOW-UP APPOINTMENTS:  If you do not already have a post-op appointment, please call the office for an appointment to be seen by your surgeon.  Guidelines for how soon to be seen are listed in your "After Visit Summary", but are typically between 1-4 weeks after surgery.  OTHER INSTRUCTIONS:   Knee Replacement:  Do not place pillow under knee, focus on keeping the knee straight while resting. CPM instructions: 0-90 degrees, 2 hours in the morning, 2 hours in the afternoon, and 2 hours in the evening. Place foam block, curve side up under heel at all times except when in CPM or when walking.  DO NOT modify, tear, cut, or change the foam block in any way.  MAKE SURE YOU:  Understand these instructions.  Get help right away if you are not doing well or get worse.    Thank you for letting us be a part of your medical care team.  It is a privilege we respect greatly.  We hope these instructions will help you stay on track for a fast and full recovery!   Do not put a pillow under the knee. Place it under the heel.   Complete by:  As directed    Place gray foam block, curve side up under heel at all times except when in CPM or when walking.  DO NOT modify, tear, cut, or change in any way the gray foam block.   Increase activity slowly as tolerated   Complete by:  As directed    Patient may shower   Complete by:  As directed    Aquacel dressing is water proof    Wash over it and the whole leg with soap and water at the end of your shower   TED hose   Complete by:  As directed    Use stockings (TED hose) for 2 weeks on both leg(s).  You may remove them at night for sleeping.      Follow-up Information    Elsie Saas, MD. Go on 01/09/2018.   Specialty:  Orthopedic Surgery Why:  Your appointment has been made for  3:00   Contact information: [address] [place] [address] [phone]        [facility]  Go on 01/09/2018.   Specialty:  Physical Therapy Why:  Your appointment has been made for 11:00 with Shanon Brow. Please arrive a few minutes early to complete your paperwork.  Contact information: [place] [address] [phone]        [facility] Follow up.   Specialty:  [place] Why:  you will have HHPT for 5 visits prior to starting your Outpatient physical therapy  Contact information: [address] [place] [address] [phone]            Signed: Linda Hedges 12/28/2017, 12:10 PM

## 2017-12-28 NOTE — Progress Notes (Signed)
Physical Therapy Treatment Patient Details Name: [name] MRN: [id number] DOB: 09-Dec-1950 Today's Date: 12/28/2017    History of Present Illness Pt is a 67 y/o female s/p elective R TKA. PMH includes R breast cancer s/p lumpectomy, and HTN.     PT Comments    Patient is making good progress with PT.  From a mobility standpoint anticipate patient will be ready for DC home when medically ready.    Follow Up Recommendations  Follow surgeon's recommendation for DC plan and follow-up therapies;Supervision for mobility/OOB     Equipment Recommendations  3in1 (PT)    Recommendations for Other Services       Precautions / Restrictions Precautions Precautions: Knee Precaution Comments: Reviewed knee precautions/positioning with pt.  Restrictions Weight Bearing Restrictions: Yes RLE Weight Bearing: Weight bearing as tolerated    Mobility  Bed Mobility               General bed mobility comments: pt OOB in chair upon arrival  Transfers Overall transfer level: Needs assistance Equipment used: Rolling walker (2 wheeled) Transfers: Sit to/from Stand Sit to Stand: Supervision            Ambulation/Gait Ambulation/Gait assistance: Supervision Gait Distance (Feet): 300 Feet Assistive device: Rolling walker (2 wheeled) Gait Pattern/deviations: Step-through pattern;Decreased stride length     General Gait Details: steady gait; pt with increased cadence today and less antalgic gait pattern   Stairs Stairs: Yes Stairs assistance: Min guard Stair Management: One rail Right;Step to pattern;Sideways Number of Stairs: 12 General stair comments: pt demonstrates carry over of sequencing    Wheelchair Mobility    Modified Rankin (Stroke Patients Only)       Balance Overall balance assessment: Needs assistance Sitting-balance support: Feet supported;No upper extremity supported Sitting balance-Leahy Scale: Good     Standing balance support: Bilateral upper  extremity supported;During functional activity Standing balance-Leahy Scale: Poor Standing balance comment: pt is able to static stand without UE support                            Cognition Arousal/Alertness: Awake/alert Behavior During Therapy: WFL for tasks assessed/performed Overall Cognitive Status: Within Functional Limits for tasks assessed                                        Exercises Total Joint Exercises Short Arc Quad: AROM;Right;10 reps Straight Leg Raises: AROM;Right;10 reps Long Arc Quad: AROM;Right;10 reps Knee Flexion: AROM;Right;Seated;Other (comment);5 reps(10 second holds)    General Comments        Pertinent Vitals/Pain Pain Assessment: Faces Faces Pain Scale: Hurts little more Pain Location: R knee  Pain Descriptors / Indicators: Sore Pain Intervention(s): Limited activity within patient's tolerance;Monitored during session;Premedicated before session;Repositioned    Home Living                      Prior Function            PT Goals (current goals can now be found in the care plan section) Progress towards PT goals: Progressing toward goals    Frequency    7X/week      PT Plan Current plan remains appropriate    Co-evaluation              AM-PAC PT "6 Clicks" Mobility   Outcome Measure  Help needed  turning from your back to your side while in a flat bed without using bedrails?: None Help needed moving from lying on your back to sitting on the side of a flat bed without using bedrails?: None Help needed moving to and from a bed to a chair (including a wheelchair)?: A Little Help needed standing up from a chair using your arms (e.g., wheelchair or bedside chair)?: A Little Help needed to walk in hospital room?: A Little Help needed climbing 3-5 steps with a railing? : A Little 6 Click Score: 20    End of Session Equipment Utilized During Treatment: Gait belt Activity Tolerance: Patient  tolerated treatment well Patient left: in chair;with call bell/phone within reach;with family/visitor present Nurse Communication: Mobility status PT Visit Diagnosis: Other abnormalities of gait and mobility (R26.89);Muscle weakness (generalized) (M62.81);Pain Pain - Right/Left: Right Pain - part of body: Knee     Time: 5072-2575 PT Time Calculation (min) (ACUTE ONLY): 24 min  Charges:  $Gait Training: 8-22 mins $Therapeutic Exercise: 8-22 mins                     Earney Navy, PTA Acute Rehabilitation Services Pager: [phone] Office: [phone]     Darliss Cheney 12/28/2017, 11:13 AM

## 2018-01-02 ENCOUNTER — Other Ambulatory Visit: Payer: Self-pay | Admitting: Obstetrics and Gynecology

## 2018-01-02 DIAGNOSIS — Z1231 Encounter for screening mammogram for malignant neoplasm of breast: Secondary | ICD-10-CM

## 2018-02-08 ENCOUNTER — Ambulatory Visit
Admission: RE | Admit: 2018-02-08 | Discharge: 2018-02-08 | Disposition: A | Discharge status: Home / Self Care | Payer: Medicare Other | Source: Ambulatory Visit | Attending: Obstetrics and Gynecology | Admitting: Obstetrics and Gynecology

## 2018-02-08 DIAGNOSIS — Z1231 Encounter for screening mammogram for malignant neoplasm of breast: Secondary | ICD-10-CM

## 2019-01-01 ENCOUNTER — Other Ambulatory Visit: Payer: Self-pay | Admitting: Obstetrics and Gynecology

## 2019-01-01 DIAGNOSIS — Z1231 Encounter for screening mammogram for malignant neoplasm of breast: Secondary | ICD-10-CM

## 2019-02-21 ENCOUNTER — Ambulatory Visit: Payer: Medicare Other

## 2019-03-28 ENCOUNTER — Ambulatory Visit: Payer: Medicare Other

## 2019-04-11 ENCOUNTER — Other Ambulatory Visit: Payer: Self-pay

## 2019-04-11 ENCOUNTER — Ambulatory Visit
Admission: RE | Admit: 2019-04-11 | Discharge: 2019-04-11 | Disposition: A | Discharge status: Home / Self Care | Payer: Medicare Other | Source: Ambulatory Visit | Attending: Obstetrics and Gynecology | Admitting: Obstetrics and Gynecology

## 2019-04-11 DIAGNOSIS — Z1231 Encounter for screening mammogram for malignant neoplasm of breast: Secondary | ICD-10-CM

## 2020-02-28 ENCOUNTER — Other Ambulatory Visit: Payer: Self-pay | Admitting: Family Medicine

## 2020-02-28 DIAGNOSIS — Z1231 Encounter for screening mammogram for malignant neoplasm of breast: Secondary | ICD-10-CM

## 2020-04-16 ENCOUNTER — Other Ambulatory Visit: Payer: Self-pay

## 2020-04-16 ENCOUNTER — Ambulatory Visit
Admission: RE | Admit: 2020-04-16 | Discharge: 2020-04-16 | Disposition: A | Discharge status: Home / Self Care | Payer: Medicare Other | Source: Ambulatory Visit | Attending: Family Medicine | Admitting: Family Medicine

## 2020-04-16 DIAGNOSIS — Z1231 Encounter for screening mammogram for malignant neoplasm of breast: Secondary | ICD-10-CM

## 2020-04-16 HISTORY — DX: Malignant neoplasm of unspecified site of unspecified female breast: C50.919

## 2021-03-06 ENCOUNTER — Other Ambulatory Visit: Payer: Self-pay | Admitting: Family Medicine

## 2021-03-06 DIAGNOSIS — Z1231 Encounter for screening mammogram for malignant neoplasm of breast: Secondary | ICD-10-CM

## 2021-04-20 ENCOUNTER — Ambulatory Visit
Admission: RE | Admit: 2021-04-20 | Discharge: 2021-04-20 | Disposition: A | Discharge status: Home / Self Care | Payer: Medicare Other | Source: Ambulatory Visit | Attending: Family Medicine | Admitting: Family Medicine

## 2021-04-20 ENCOUNTER — Ambulatory Visit: Payer: Medicare Other

## 2021-04-20 DIAGNOSIS — Z1231 Encounter for screening mammogram for malignant neoplasm of breast: Secondary | ICD-10-CM

## 2022-03-22 ENCOUNTER — Other Ambulatory Visit: Payer: Self-pay | Admitting: Family Medicine

## 2022-03-22 DIAGNOSIS — Z1231 Encounter for screening mammogram for malignant neoplasm of breast: Secondary | ICD-10-CM

## 2022-05-06 ENCOUNTER — Ambulatory Visit
Admission: RE | Admit: 2022-05-06 | Discharge: 2022-05-06 | Disposition: A | Discharge status: Home / Self Care | Payer: Medicare Other | Source: Ambulatory Visit | Attending: Family Medicine | Admitting: Family Medicine

## 2022-05-06 DIAGNOSIS — Z1231 Encounter for screening mammogram for malignant neoplasm of breast: Secondary | ICD-10-CM

## 2022-07-10 IMAGING — MG MM DIGITAL SCREENING BILAT W/ TOMO AND CAD
8 series · 8 of 24 positions shown · non-contrast
Comparison: Previous exam(s).

CLINICAL DATA: Screening.

EXAM:
DIGITAL SCREENING BILATERAL MAMMOGRAM WITH TOMOSYNTHESIS AND CAD
TECHNIQUE: Bilateral screening digital craniocaudal and mediolateral oblique
mammograms were obtained. Bilateral screening digital breast
tomosynthesis was performed. The images were evaluated with
computer-aided detection.

[L CC synth-2D]
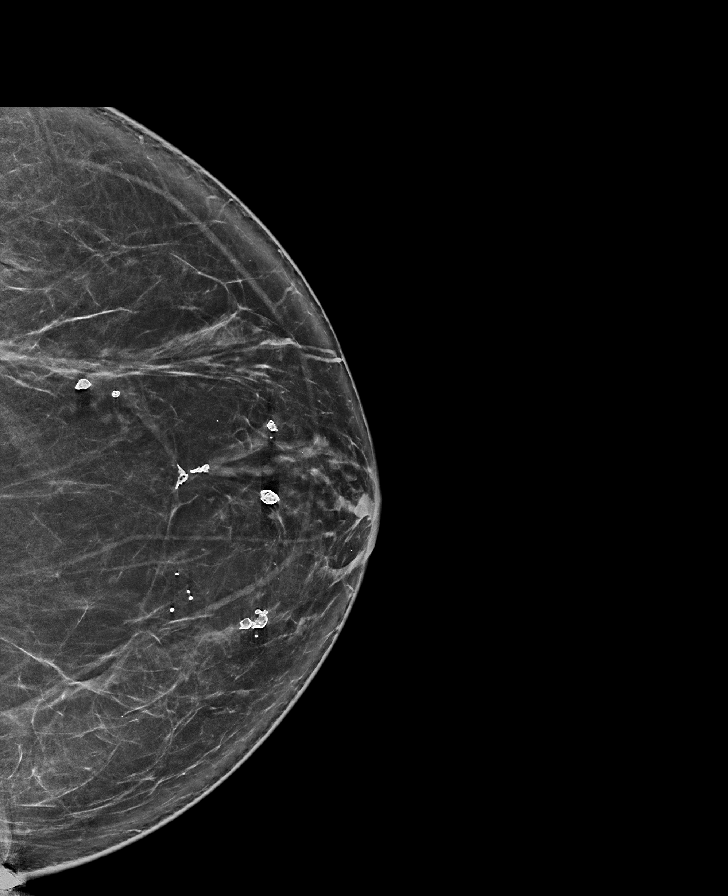

[R MLO synth-2D]
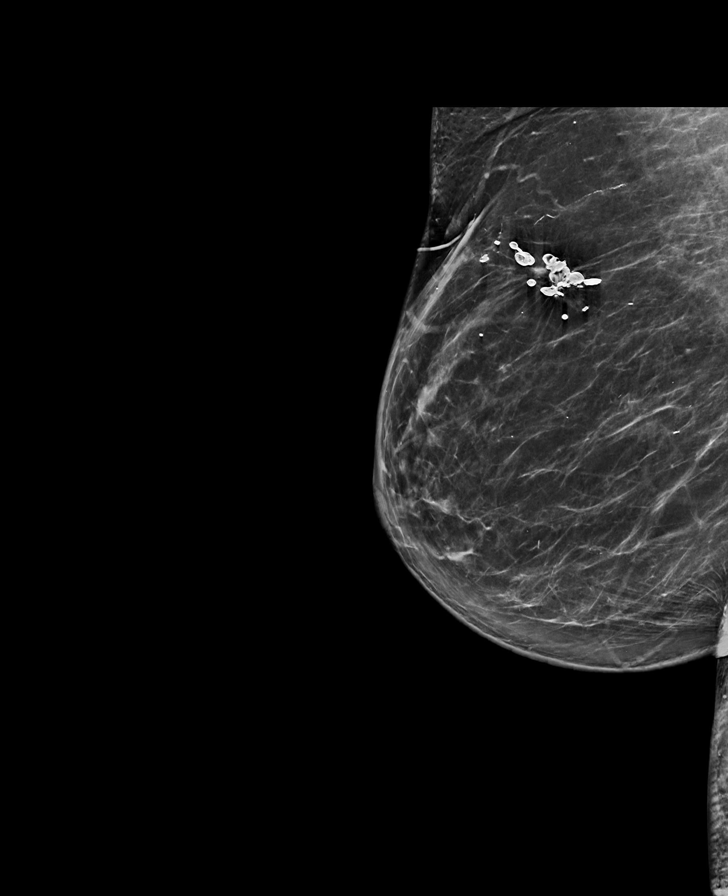

[L MLO synth-2D]
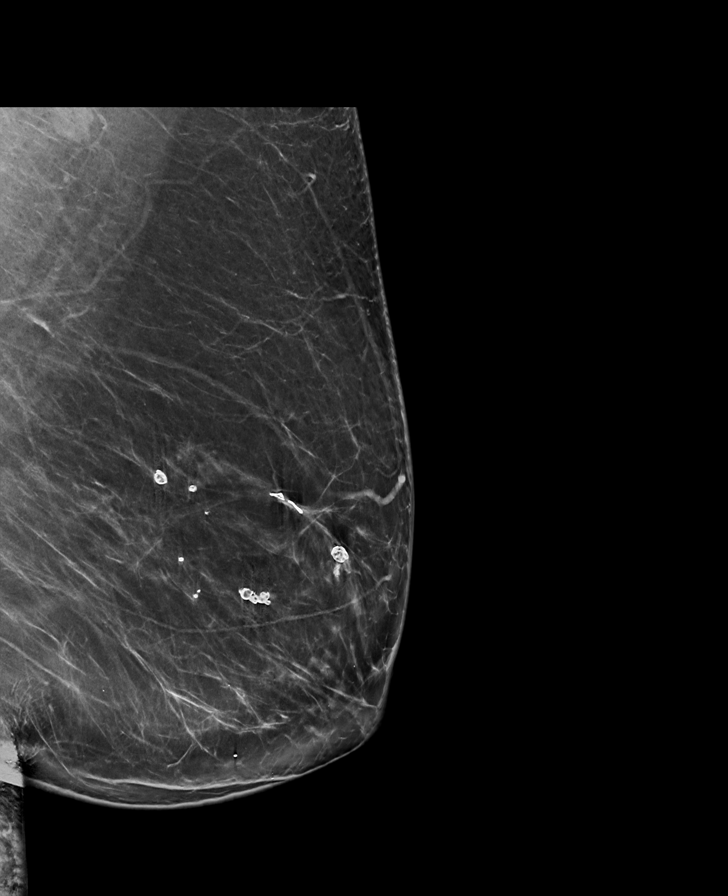

[R CC synth-2D]
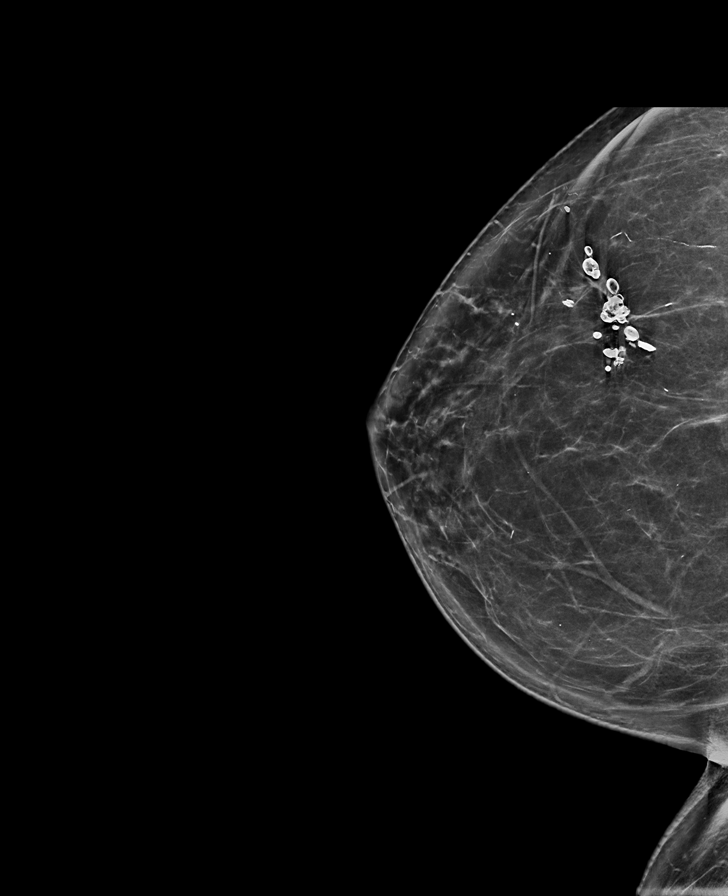

[R CC tomo · tomo slice 41/81.0]
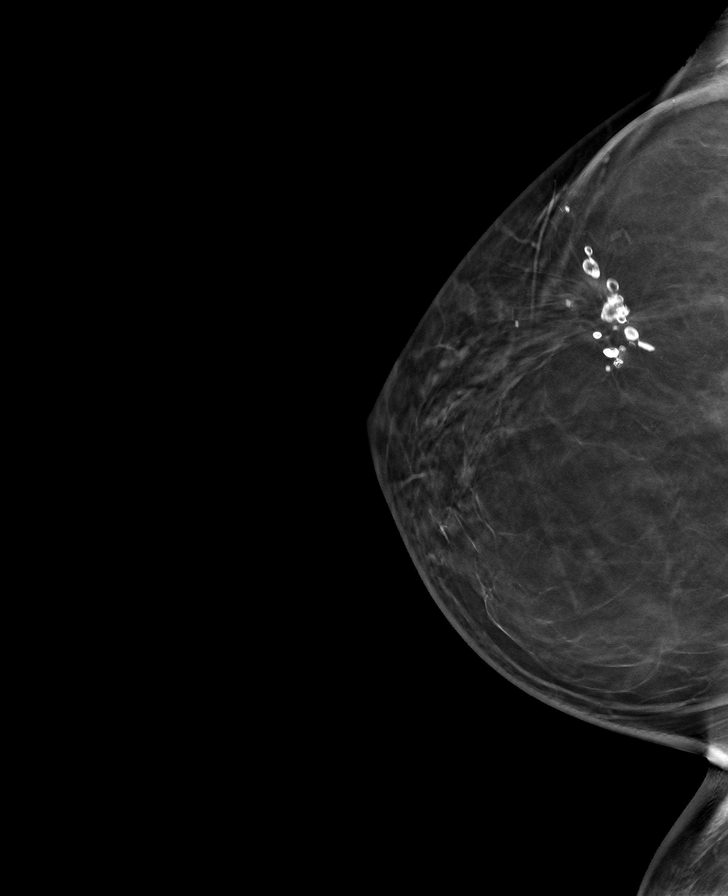

[L CC tomo · tomo slice 43/84.0]
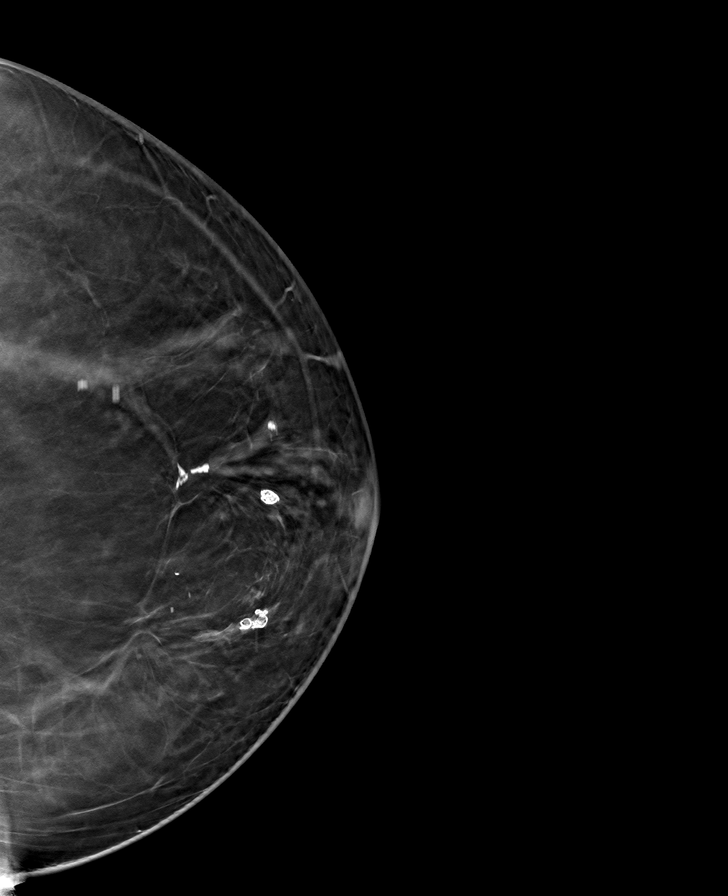

[R MLO tomo · tomo slice 42/83.0]
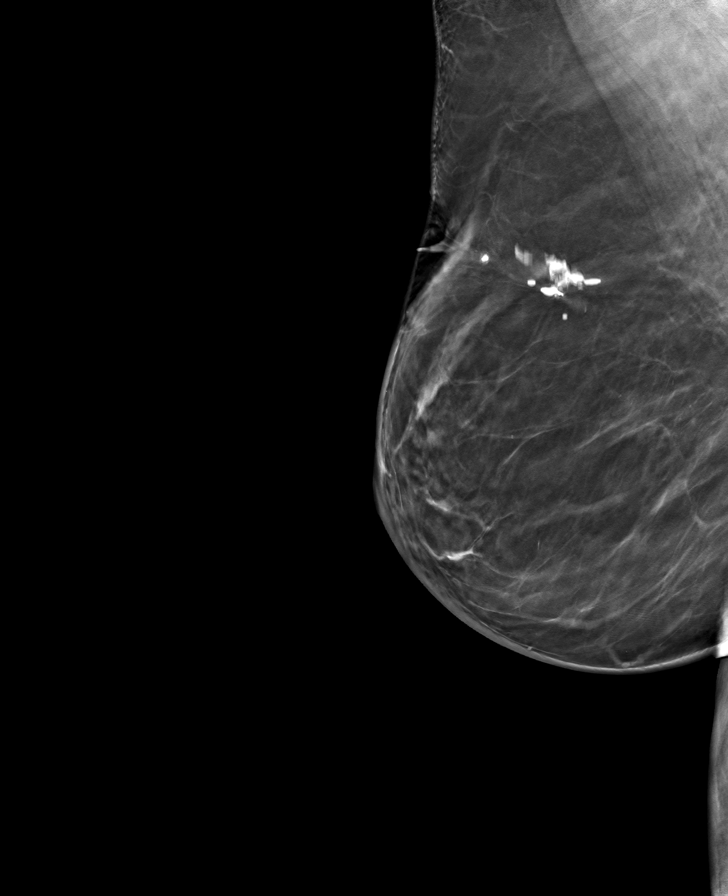

[L MLO tomo · tomo slice 47/94.0]
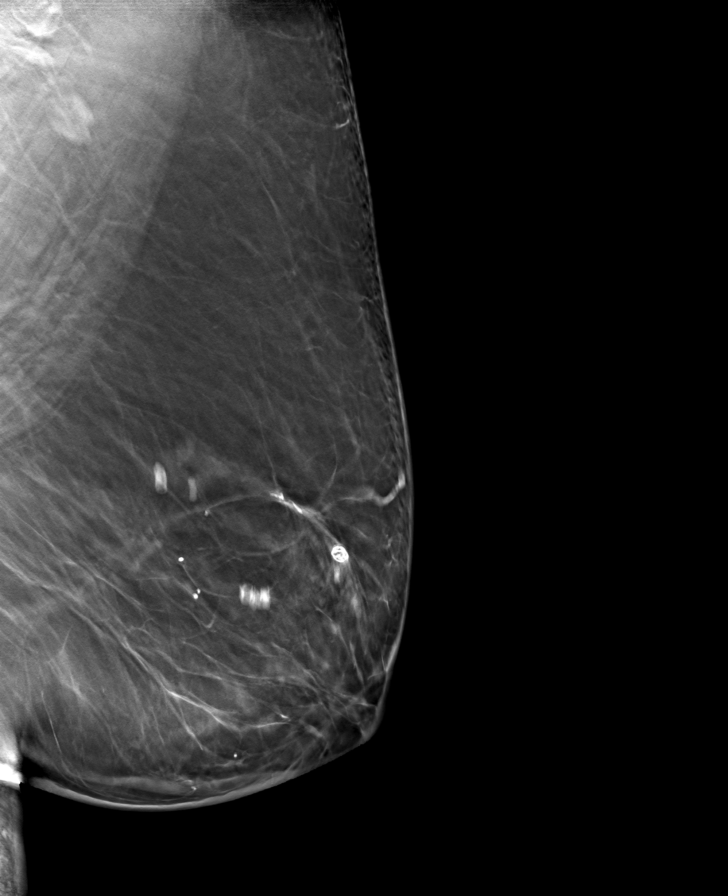

[8 of 24 positions shown; findings below may reference images not displayed]

ACR Breast Density Category b: There are scattered areas of
fibroglandular density.
FINDINGS: There are no findings suspicious for malignancy.
IMPRESSION: No mammographic evidence of malignancy. A result letter of this
screening mammogram will be mailed directly to the patient.

RECOMMENDATION:
Screening mammogram in one year. (Code:51-O-LD2)

BI-RADS CATEGORY  1: Negative.

## 2023-03-28 ENCOUNTER — Other Ambulatory Visit: Payer: Self-pay | Admitting: Family Medicine

## 2023-03-28 DIAGNOSIS — Z1231 Encounter for screening mammogram for malignant neoplasm of breast: Secondary | ICD-10-CM

## 2023-05-09 ENCOUNTER — Ambulatory Visit
Admission: RE | Admit: 2023-05-09 | Discharge: 2023-05-09 | Disposition: A | Discharge status: Home / Self Care | Payer: Medicare Other | Source: Ambulatory Visit | Attending: Family Medicine | Admitting: Family Medicine

## 2023-05-09 DIAGNOSIS — Z1231 Encounter for screening mammogram for malignant neoplasm of breast: Secondary | ICD-10-CM

## 2023-11-03 ENCOUNTER — Encounter (HOSPITAL_COMMUNITY): Payer: Self-pay

## 2023-11-03 ENCOUNTER — Emergency Department (HOSPITAL_COMMUNITY)

## 2023-11-03 ENCOUNTER — Observation Stay (HOSPITAL_COMMUNITY)
Admission: EM | Admit: 2023-11-03 | Discharge: 2023-11-04 | Disposition: A | Discharge status: Home / Self Care | Attending: Family Medicine | Admitting: Family Medicine

## 2023-11-03 ENCOUNTER — Other Ambulatory Visit: Payer: Self-pay

## 2023-11-03 DIAGNOSIS — Z96651 Presence of right artificial knee joint: Secondary | ICD-10-CM | POA: Insufficient documentation

## 2023-11-03 DIAGNOSIS — E66812 Obesity, class 2: Secondary | ICD-10-CM | POA: Insufficient documentation

## 2023-11-03 DIAGNOSIS — G4733 Obstructive sleep apnea (adult) (pediatric): Secondary | ICD-10-CM | POA: Insufficient documentation

## 2023-11-03 DIAGNOSIS — Z853 Personal history of malignant neoplasm of breast: Secondary | ICD-10-CM | POA: Insufficient documentation

## 2023-11-03 DIAGNOSIS — E1169 Type 2 diabetes mellitus with other specified complication: Secondary | ICD-10-CM

## 2023-11-03 DIAGNOSIS — R29701 NIHSS score 1: Secondary | ICD-10-CM | POA: Diagnosis not present

## 2023-11-03 DIAGNOSIS — I10 Essential (primary) hypertension: Secondary | ICD-10-CM | POA: Diagnosis not present

## 2023-11-03 DIAGNOSIS — I6389 Other cerebral infarction: Principal | ICD-10-CM | POA: Insufficient documentation

## 2023-11-03 DIAGNOSIS — Z7982 Long term (current) use of aspirin: Secondary | ICD-10-CM | POA: Insufficient documentation

## 2023-11-03 DIAGNOSIS — I639 Cerebral infarction, unspecified: Secondary | ICD-10-CM | POA: Diagnosis not present

## 2023-11-03 DIAGNOSIS — Z7984 Long term (current) use of oral hypoglycemic drugs: Secondary | ICD-10-CM | POA: Insufficient documentation

## 2023-11-03 DIAGNOSIS — Z6835 Body mass index (BMI) 35.0-35.9, adult: Secondary | ICD-10-CM | POA: Diagnosis not present

## 2023-11-03 DIAGNOSIS — Z7902 Long term (current) use of antithrombotics/antiplatelets: Secondary | ICD-10-CM | POA: Insufficient documentation

## 2023-11-03 DIAGNOSIS — Z79899 Other long term (current) drug therapy: Secondary | ICD-10-CM | POA: Insufficient documentation

## 2023-11-03 DIAGNOSIS — E119 Type 2 diabetes mellitus without complications: Secondary | ICD-10-CM | POA: Diagnosis not present

## 2023-11-03 DIAGNOSIS — R911 Solitary pulmonary nodule: Secondary | ICD-10-CM

## 2023-11-03 DIAGNOSIS — R202 Paresthesia of skin: Secondary | ICD-10-CM | POA: Diagnosis present

## 2023-11-03 LAB — CBC WITH DIFFERENTIAL/PLATELET
Abs Immature Granulocytes: 0.04 K/uL (ref 0.00–0.07)
Basophils Absolute: 0.1 K/uL (ref 0.0–0.1)
Basophils Relative: 1 %
Eosinophils Absolute: 0.1 K/uL (ref 0.0–0.5)
Eosinophils Relative: 1 %
HCT: 43.5 % (ref 36.0–46.0)
Hemoglobin: 15.1 g/dL — ABNORMAL HIGH (ref 12.0–15.0)
Immature Granulocytes: 1 %
Lymphocytes Relative: 15 %
Lymphs Abs: 1.2 K/uL (ref 0.7–4.0)
MCH: 30.6 pg (ref 26.0–34.0)
MCHC: 34.7 g/dL (ref 30.0–36.0)
MCV: 88.2 fL (ref 80.0–100.0)
Monocytes Absolute: 0.5 K/uL (ref 0.1–1.0)
Monocytes Relative: 6 %
Neutro Abs: 6 K/uL (ref 1.7–7.7)
Neutrophils Relative %: 76 %
Platelets: 256 K/uL (ref 150–400)
RBC: 4.93 MIL/uL (ref 3.87–5.11)
RDW: 13.5 % (ref 11.5–15.5)
WBC: 7.8 K/uL (ref 4.0–10.5)
nRBC: 0 % (ref 0.0–0.2)

## 2023-11-03 LAB — BASIC METABOLIC PANEL WITH GFR
Anion gap: 11 (ref 5–15)
BUN: 11 mg/dL (ref 8–23)
CO2: 25 mmol/L (ref 22–32)
Calcium: 10.1 mg/dL (ref 8.9–10.3)
Chloride: 101 mmol/L (ref 98–111)
Creatinine, Ser: 0.75 mg/dL (ref 0.44–1.00)
GFR, Estimated: >60 mL/min (ref >60)
Glucose, Bld: 150 mg/dL — ABNORMAL HIGH (ref 70–99)
Potassium: 3.8 mmol/L (ref 3.5–5.1)
Sodium: 137 mmol/L (ref 135–145)

## 2023-11-03 LAB — URINALYSIS, W/ REFLEX TO CULTURE (INFECTION SUSPECTED)
Bilirubin Urine: NEGATIVE
Glucose, UA: NEGATIVE mg/dL
Hgb urine dipstick: NEGATIVE
Ketones, ur: NEGATIVE mg/dL
Leukocytes,Ua: NEGATIVE
Nitrite: NEGATIVE
Protein, ur: NEGATIVE mg/dL
Specific Gravity, Urine: 1.01 (ref 1.005–1.030)
pH: 7 (ref 5.0–8.0)

## 2023-11-03 LAB — MAGNESIUM: Magnesium: 1.9 mg/dL (ref 1.7–2.4)

## 2023-11-03 LAB — TSH: TSH: 2.657 u[IU]/mL (ref 0.350–4.500)

## 2023-11-03 MED ORDER — FAMOTIDINE 20 MG PO TABS
40.0000 mg | ORAL_TABLET | Freq: Every evening | ORAL | Status: DC
  Administered 2023-11-03: 40 mg via ORAL
  Filled 2023-11-03: qty 2

## 2023-11-03 MED ORDER — LOSARTAN POTASSIUM 50 MG PO TABS
50.0000 mg | ORAL_TABLET | Freq: Every day | ORAL | Status: DC
  Administered 2023-11-04: 50 mg via ORAL
  Filled 2023-11-03: qty 1

## 2023-11-03 MED ORDER — LORAZEPAM 2 MG/ML IJ SOLN
1.0000 mg | Freq: Once | INTRAMUSCULAR | Status: AC | PRN
  Administered 2023-11-03: 1 mg via INTRAVENOUS
  Filled 2023-11-03: qty 1

## 2023-11-03 MED ORDER — DICLOFENAC SODIUM 1 % EX GEL
2.0000 g | Freq: Four times a day (QID) | CUTANEOUS | Status: DC
Start: 2023-11-03 — End: 2023-11-04
  Filled 2023-11-03: qty 100

## 2023-11-03 MED ORDER — PANTOPRAZOLE SODIUM 40 MG PO TBEC
40.0000 mg | DELAYED_RELEASE_TABLET | Freq: Every day | ORAL | Status: DC
  Administered 2023-11-04: 40 mg via ORAL
  Filled 2023-11-03: qty 1

## 2023-11-03 MED ORDER — METFORMIN HCL ER 500 MG PO TB24: 500.0000 mg | ORAL_TABLET | Freq: Every day | ORAL | Status: DC

## 2023-11-03 MED ORDER — HYDRALAZINE HCL 20 MG/ML IJ SOLN: 5.0000 mg | Freq: Four times a day (QID) | INTRAMUSCULAR | Status: DC | PRN

## 2023-11-03 MED ORDER — FUROSEMIDE 20 MG PO TABS
20.0000 mg | ORAL_TABLET | Freq: Every day | ORAL | Status: DC
  Administered 2023-11-04: 20 mg via ORAL
  Filled 2023-11-03: qty 1

## 2023-11-03 MED ORDER — ASPIRIN 81 MG PO TBEC
81.0000 mg | DELAYED_RELEASE_TABLET | Freq: Every day | ORAL | Status: DC
  Administered 2023-11-03 – 2023-11-04 (×2): 81 mg via ORAL
  Filled 2023-11-03 (×2): qty 1

## 2023-11-03 MED ORDER — STROKE: EARLY STAGES OF RECOVERY BOOK
Freq: Once | Status: AC
  Administered 2023-11-04: 1
  Filled 2023-11-03: qty 1

## 2023-11-03 MED ORDER — ROSUVASTATIN CALCIUM 20 MG PO TABS
20.0000 mg | ORAL_TABLET | Freq: Every day | ORAL | Status: DC
  Administered 2023-11-03: 20 mg via ORAL
  Filled 2023-11-03: qty 1

## 2023-11-03 MED ORDER — IOHEXOL 350 MG/ML SOLN
75.0000 mL | Freq: Once | INTRAVENOUS | Status: AC | PRN
Start: 2023-11-03 — End: 2023-11-03
  Administered 2023-11-03: 75 mL via INTRAVENOUS

## 2023-11-03 MED ORDER — ALPRAZOLAM 0.25 MG PO TABS: 0.2500 mg | ORAL_TABLET | Freq: Every day | ORAL | Status: DC | PRN

## 2023-11-03 MED ORDER — LORAZEPAM 2 MG/ML IJ SOLN
1.0000 mg | Freq: Once | INTRAMUSCULAR | Status: AC
  Administered 2023-11-03: 1 mg via INTRAVENOUS
  Filled 2023-11-03: qty 1

## 2023-11-03 NOTE — Assessment & Plan Note (Signed)
 A1c 5.6% earlier this month on metformin - Continue metformin

## 2023-11-03 NOTE — ED Provider Notes (Signed)
 [place] EMERGENCY DEPARTMENT AT [facility] Provider Note   CSN: [id number] Arrival date & time: 11/03/23  1005     Patient presents with: Numbness   [name] is a 73 y.o. female.   [name] is a 73 yo female with PMH of breast cancer diagnosed in 2007 s/p radiation and now in remission, GERD, HTN.  Last night, patient developed numbness around her mouth and on the right side of her face. Also noted numbness of right thumb and forefinger. No weakness in any muscle groups, no falls, no tripping, no dysarthria, no confusion, no LOC.  She has not experienced changes in visual field, tunnel vision, or curtain passing over vision. Daughter has been with her and has not noticed any changes in behavior. Patient notes that she had been moving boxes over the weekend with daughter over this past weakness and had been having some cervical pain with radiation to bilateral shoulders.      Prior to Admission medications   Medication Sig Start Date End Date Taking? Authorizing Provider  ALPRAZolam  (XANAX ) 0.5 MG tablet Take 0.5 mg by mouth daily as needed for anxiety.  06/17/11   [provider]  aspirin  EC 325 MG EC tablet 1 tab a day for the next 30 days to prevent blood clots 12/28/17   Shepperson, Kirstin, PA-C  CALCIUM-MAGNESIUM-ZINC PO Take 1 tablet by mouth daily.    [provider]  Cholecalciferol 125 MCG (5000 UT) TABS Take 5,000 Units by mouth daily.     [provider]  docusate sodium  (COLACE) 100 MG capsule 1 tab 2 times a day while on narcotics.  STOOL SOFTENER 12/28/17   Shepperson, Kirstin, PA-C  furosemide  (LASIX ) 20 MG tablet Take 20 mg by mouth 2 (two) times daily.    [provider]  gabapentin  (NEURONTIN ) 600 MG tablet Take 0.5 tablets (300 mg total) by mouth at bedtime. 12/28/17   Shepperson, Kirstin, PA-C  losartan  (COZAAR ) 50 MG tablet Take 50 mg by mouth daily.    [provider]  Multiple Vitamin  (MULTIVITAMIN) tablet Take 1 tablet by mouth daily.    [provider]  oxyCODONE  (OXY IR/ROXICODONE ) 5 MG immediate release tablet Take 1-2 tablets (5-10 mg total) by mouth every 4 (four) hours as needed for moderate pain (pain score 4-6). 12/28/17   Shepperson, Kirstin, PA-C  pantoprazole  (PROTONIX ) 40 MG tablet Take 40 mg by mouth daily.    [provider]  polyethylene glycol (MIRALAX  / GLYCOLAX ) packet 17grams in 6 oz of something to drink twice a day until bowel movement.  LAXITIVE.  Restart if two days since last bowel movement 12/28/17   Shepperson, Kirstin, PA-C  Probiotic Product (PROBIOTIC FORMULA PO) Take 1 tablet by mouth daily.    [provider]    Allergies: Patient has no known allergies.    Review of Systems  Constitutional:  Negative for chills and fever.  HENT:  Negative for ear pain and sore throat.   Eyes:  Negative for pain and visual disturbance.  Respiratory:  Negative for cough and shortness of breath.   Cardiovascular:  Negative for chest pain and palpitations.  Gastrointestinal:  Negative for abdominal pain and vomiting.  Genitourinary:  Negative for dysuria and hematuria.  Musculoskeletal:  Negative for arthralgias and back pain.  Skin:  Negative for color change and rash.  Neurological:  Positive for numbness (Right-sided perioral and R side of face, right thumb and index finger). Negative for dizziness, tremors,  seizures, syncope, facial asymmetry, speech difficulty, weakness, light-headedness and headaches.  All other systems reviewed and are negative.   Updated Vital Signs BP (!) 211/111   Pulse (!) 118   Temp 98.1 F (36.7 C) (Oral)   Resp 17   Ht 5' 6 (1.676 m)   Wt 99 kg   SpO2 98%   BMI 35.23 kg/m   Physical Exam Vitals and nursing note reviewed.  Constitutional:      General: She is not in acute distress.    Appearance: She is well-developed.  HENT:     Head: Normocephalic and atraumatic.  Eyes:     General:  No visual field deficit.    Conjunctiva/sclera: Conjunctivae normal.  Cardiovascular:     Rate and Rhythm: Regular rhythm. Tachycardia present.     Heart sounds: No murmur heard. Pulmonary:     Effort: Pulmonary effort is normal. No respiratory distress.     Breath sounds: Normal breath sounds.  Abdominal:     General: Abdomen is flat.     Palpations: Abdomen is soft.     Tenderness: There is no abdominal tenderness.  Musculoskeletal:        General: No swelling.     Cervical back: Neck supple.  Skin:    General: Skin is warm and dry.     Capillary Refill: Capillary refill takes less than 2 seconds.  Neurological:     General: No focal deficit present.     Mental Status: She is alert and oriented to person, place, and time. Mental status is at baseline.     Cranial Nerves: No cranial nerve deficit, dysarthria or facial asymmetry.     Sensory: Sensory deficit (numbness to palpation of right thumb, right index finger, and perioral region on right side) present.     Motor: No weakness (5/5 in all large muscle groups bilaterally and in bilateral hands), tremor, abnormal muscle tone or pronator drift.     Coordination: Coordination normal. Finger-Nose-Finger Test normal.     Gait: Gait is intact.  Psychiatric:        Mood and Affect: Mood normal.     (all labs ordered are listed, but only abnormal results are displayed) Labs Reviewed - No data to display  EKG: EKG Interpretation Date/Time:  Thursday November 03 2023 10:05:35 EDT Ventricular Rate:  118 PR Interval:    QRS Duration:  94 QT Interval:  336 QTC Calculation: 471 R Axis:   5  Text Interpretation: Normal sinus rhythm Low voltage, precordial leads Confirmed by Darra Chew [phone]) on 11/03/2023 10:08:13 AM  Radiology: No results found.   Procedures   Medications Ordered in the ED - No data to display                                  Medical Decision Making [name] is a 73 yo female presenting with right  sided numbness of face and right thumb/index finger.  Initially considered TIA, stroke, cranial nerve palsy, cervical spine myelopathy, increased intracranial pressure, or carpal tunnel.  Peripheral numbness in median nerve distribution of hand in the setting of carrying boxes is likely related to simple nerve compression.  However, unilateral numbness of face cannot be clearly explained and requires further workup. Overall reassured that patient has no facial droop, no dysarthria, and no weakness (unilateral or otherwise).  CT head WO was obtained and without acute abnormality to suggest stroke or  other intracranial pathology. CBC with borderline thrombocytosis but overall unremarkable, while magnesium, BMP, and TSH were all WNL.  UA negative for UTI. Patient received Ativan x1 for anxiety.  MRI head was obtained for follow up with additional dose of Ativan for claustrophobia.  Scan not yet obtained at time of my signout.  Amount and/or Complexity of Data Reviewed Labs: ordered. Radiology: ordered and independent interpretation performed. Decision-making details documented in ED Course.  Risk Prescription drug management.      Final diagnoses:  None    ED Discharge Orders     None        Zayd Bonet, MD 11/03/23 1557    Darra Fonda MATSU, MD 11/04/23 [phone]

## 2023-11-03 NOTE — ED Notes (Signed)
 Contacted MRI for a time.  MRI to call back.

## 2023-11-03 NOTE — Assessment & Plan Note (Addendum)
 Hypertensive urgency - Permissive HTN <220/120 overnight - Resume losartan  and furosemide  tomorrow

## 2023-11-03 NOTE — ED Notes (Signed)
 Breathing is even and unlabored. Call light within reach, encouraged to use when needs arise. Support person at bedside. PT updated on tentative MRI tim. PT declined a pillow and a blanket.

## 2023-11-03 NOTE — Consult Note (Signed)
 NEUROLOGY CONSULT NOTE   Date of service: November 03, 2023 Patient Name: [name] MRN:  [id number] DOB:  1950-10-27 Chief Complaint: Stroke of MRI Requesting Provider: Jonel Lonni SQUIBB, *  History of Present Illness  [name] is a 73 y.o. female with a PMHx of obesity, HTN, BrCA and DM who presented with acute right sided facial numbness and right finger numbness.  A couple of weeks ago she noted that while she was driving she felt a little off and had a strange pressure-like feeling in her head, but it cleared up and she was able to continue driving home.  Last night she noted the right facial numbness and index and thumb numbness that persisted and this morning she had a strange feeling in her head again - she checked her blood pressure this afternoon and the systolic was over 200 so she came to the ER.  Her PCP is through [facility] and she had an appointment on 9/22.  At that time her hemoglobin A1c was 5.6 and her LDL was 112.  She is not currently on any antiplatelet medications.  She does take metformin, Lasix , and losartan  as well as a fish oil pill for her cholesterol.  The symptoms have improved and she is noting some numbness just around the corner of her mouth and the above noted fingers.  Her sensation to touch is symmetrical in her fingers with double simultaneous stimulation and temperature but she states they do still feel a little off in her right index finger and thumb when not being touched.   LKW: 10/1 Modified rankin score: 0-Completely asymptomatic and back to baseline post- stroke  NIHSS components Score: Comment  1a Level of Conscious 0[]  1[]  2[]  3[]      1b LOC Questions 0[]  1[]  2[]       1c LOC Commands 0[]  1[]  2[]       2 Best Gaze 0[]  1[]  2[]       3 Visual 0[]  1[]  2[]  3[]      4 Facial Palsy 0[]  1[]  2[]  3[]      5a Motor Arm - left 0[]  1[]  2[]  3[]  4[]  UN[]    5b Motor Arm - Right 0[]  1[]  2[]  3[]  4[]  UN[]    6a Motor Leg - Left 0[]  1[]  2[]  3[]  4[]  UN[]     6b Motor Leg - Right 0[]  1[]  2[]  3[]  4[]  UN[]    7 Limb Ataxia 0[]  1[]  2[]  UN[]      8 Sensory 0[]  1[x]  2[]  UN[]      9 Best Language 0[]  1[]  2[]  3[]      10 Dysarthria 0[]  1[]  2[]  UN[]      11 Extinct. and Inattention 0[]  1[]  2[]       TOTAL:1       ROS  Comprehensive ROS performed and pertinent positives documented in HPI   Past History   Past Medical History:  Diagnosis Date   Arthritis    Breast cancer (HCC)    Breast cancer, right breast (HCC) 07/21/2011   GERD (gastroesophageal reflux disease)    Hypertension    Personal history of radiation therapy 2007   Pneumonia 1988   h/o   PONV (postoperative nausea and vomiting)    Primary localized osteoarthritis of right knee 12/26/2017    Past Surgical History:  Procedure Laterality Date   ABDOMINAL HYSTERECTOMY  1994   ANAL FISSURE REPAIR  1999   APPENDECTOMY  2015   BREAST BIOPSY Right 03/05/2005   BREAST LUMPECTOMY Right 03/25/2005   BREAST REDUCTION SURGERY  2011   KNEE ARTHROSCOPY Right    2013,2018   REDUCTION MAMMAPLASTY     TOTAL KNEE ARTHROPLASTY Right 12/26/2017   Procedure: RIGHT TOTAL KNEE ARTHROPLASTY;  Surgeon: Jane Charleston, MD;  Location: [facility] OR;  Service: Orthopedics;  Laterality: Right;    Family History: Family History  Problem Relation Age of Onset   Breast cancer Paternal Aunt    Breast cancer Paternal Aunt    Breast cancer Paternal Aunt     Social History  reports that she has never smoked. She has never used smokeless tobacco. She reports current alcohol use. She reports that she does not use drugs.  No Known Allergies  Medications   Current Facility-Administered Medications:    [START ON 11/04/2023]  stroke: early stages of recovery book, , Does not apply, Once, Remi Pippin, NP  Current Outpatient Medications:    ALPRAZolam  (XANAX ) 0.5 MG tablet, Take 0.25 mg by mouth daily as needed for anxiety or sleep., Disp: , Rfl:    CALCIUM-MAGNESIUM-ZINC PO, Take 1 tablet by mouth daily., Disp:  , Rfl:    cholecalciferol (VITAMIN D3) 25 MCG (1000 UNIT) tablet, Take 1,000 Units by mouth daily., Disp: , Rfl:    diclofenac Sodium (VOLTAREN) 1 % GEL, Apply 2 g topically 4 (four) times daily., Disp: , Rfl:    famotidine (PEPCID) 40 MG tablet, Take 40 mg by mouth every evening., Disp: , Rfl:    furosemide  (LASIX ) 20 MG tablet, Take 20 mg by mouth daily., Disp: , Rfl:    ibuprofen (ADVIL) 200 MG tablet, Take 400 mg by mouth at bedtime., Disp: , Rfl:    losartan  (COZAAR ) 50 MG tablet, Take 50 mg by mouth daily., Disp: , Rfl:    metFORMIN (GLUCOPHAGE-XR) 500 MG 24 hr tablet, Take 500 mg by mouth daily with breakfast., Disp: , Rfl:    mometasone (ELOCON) 0.1 % cream, Apply 1 Application topically daily as needed (eczema)., Disp: , Rfl:    Multiple Vitamin (MULTIVITAMIN) tablet, Take 1 tablet by mouth daily., Disp: , Rfl:    Omega-3 Fatty Acids (FISH OIL) 1200 MG CAPS, Take 2,400 capsules by mouth daily., Disp: , Rfl:    pantoprazole  (PROTONIX ) 40 MG tablet, Take 40 mg by mouth daily., Disp: , Rfl:    Probiotic Product (PROBIOTIC FORMULA PO), Take 1 tablet by mouth daily., Disp: , Rfl:    psyllium (REGULOID) 0.52 g capsule, Take 3 capsules by mouth daily., Disp: , Rfl:    aspirin  EC 325 MG EC tablet, 1 tab a day for the next 30 days to prevent blood clots (Patient not taking: Reported on 11/03/2023), Disp: 30 tablet, Rfl: 0  Vitals   Vitals:   11/03/23 1436 11/03/23 1500 11/03/23 1530 11/03/23 1730  BP: (!) 208/102 (!) 185/96 (!) 190/107 (!) 200/90  Pulse: (!) 106 (!) 108 (!) 106 (!) 117  Resp: (!) 21 17 17 17   Temp: 98.1 F (36.7 C)     TempSrc: Oral     SpO2: 99% 100% 95% 98%  Weight:      Height:        Body mass index is 35.23 kg/m.   Physical Exam   Constitutional: Appears well-developed and well-nourished.  Psych: Affect appropriate to situation. Tearful. Eyes: No scleral injection.  HENT: No OP obstruction.  Head: Normocephalic.  Cardiovascular: Normal rate and regular  rhythm.  Respiratory: Effort normal, non-labored breathing.  GI: Soft.  No distension. There is no tenderness.  Skin: WDI.   Neurologic Examination  Mental Status: Patient is awake, alert, oriented to person, place, month, year, and situation. Patient is able to give a clear and coherent history. No signs of aphasia or neglect Cranial Nerves: II: Visual Fields are full. Pupils are equal, round, and reactive to light.   III,IV, VI: EOMI without ptosis or diplopia.  V: Facial sensation is symmetric to temperature.  Endorses numbness around the right outer corner of the mouth VII: Facial movement is symmetric resting and smiling VIII: Hearing is intact to voice X: Palate elevates symmetrically XI: Shoulder shrug is symmetric. XII: Tongue protrudes midline without atrophy or fasciculations.  Motor: Tone is normal. Bulk is normal. 5/5 strength was present in all four extremities.  Sensory: Sensation is symmetric to light touch and temperature in the arms and legs. No extinction to DSS present.  She states her index and thumb on the right hand feel numb to herself, however she does not feel numbness with light touch Cerebellar: FNF and HKS are intact bilaterally Gait: Deferred   Labs/Imaging/Neurodiagnostic studies   CBC:  Recent Labs  Lab 11-06-2023 1101  WBC 7.8  NEUTROABS 6.0  HGB 15.1*  HCT 43.5  MCV 88.2  PLT 256   Basic Metabolic Panel:  Lab Results  Component Value Date   NA 137 November 06, 2023   K 3.8 11-06-23   CO2 25 06-Nov-2023   GLUCOSE 150 (H) 06-Nov-2023   BUN 11 2023-11-06   CREATININE 0.75 11-06-23   CALCIUM 10.1 November 06, 2023   GFRNONAA >60 Nov 06, 2023   GFRAA >60 12/28/2017   Lipid Panel: No results found for: LDLCALC HgbA1c: No results found for: HGBA1C Urine Drug Screen: No results found for: LABOPIA, COCAINSCRNUR, LABBENZ, AMPHETMU, THCU, LABBARB  Alcohol Level No results found for: [facility] INR  Lab Results  Component Value Date    INR 1.00 12/15/2017   APTT  Lab Results  Component Value Date   APTT 29 12/15/2017   AED levels: No results found for: PHENYTOIN, ZONISAMIDE, LAMOTRIGINE, LEVETIRACETA  CT Head without contrast (Personally reviewed): No acute intracranial abnormality  CT angio Head and Neck with contrast (Personally reviewed): 1. No large vessel occlusion. 2. Mild stenosis of the right supraclinoid ICA. 3. Tapering and mild stenosis of the distal M1 segment left MCA and additional mild stenosis of a proximal M2 branch of the left MCA. 4. Moderate to severe stenosis of the P2 segment left PCA. 5. Abnormal soft tissue adjacent to the bronchovascular bundle in the medial right upper lobe just superior to the right hilum, which could reflect a pulmonary nodule/mass versus enlarged lymph nodes. Recommend correlation with dedicated CT chest.  MRI Brain(Personally reviewed): 4 mm acute infarct within the left thalamus.  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 female with a PMHx of obesity, HTN, BrCA and DM who presented with acute right sided facial numbness and right finger numbness that started last night.  Today she took her blood pressure and her systolic was greater than 200 so she presented to the emergency department.  MRI shows an acute punctate left thalamic infarct.  Start home blood pressure medications in the morning as she will be outside of the window for permissive hypertension.  DAPT with aspirin  and Plavix.  She is agreeable to starting a statin.  Admit to hospitalist service and stroke team to follow tomorrow.  RECOMMENDATIONS  - Frequent neuro checks - Echocardiogram - Carotid dopplers - Prophylactic therapy-Antiplatelet med: Aspirin  81 mg  and Plavix 75 mg  - Atorvastatin - Risk factor modification - Telemetry monitoring - PT consult, OT consult, Speech consult - Stroke team to  follow  ______________________________________________________________________    Signed, Jorene Last, NP [facility]   I have seen and examined the patient. I have formulated the assessment and recommendations. 73 y.o. female with a PMHx of obesity, HTN, BrCA and DM who presented with acute right sided facial numbness and right finger numbness that started last night. MRI brain reveals an acute punctate left thalamic ischemic infarction. Exam reveals mild numbness to right perioral region. Symptomatically with right perioral and right index finger and thumb paresthesia. No focal weakness noted. Recommendations as above.  Electronically signed: Dr. Hyder Deman

## 2023-11-03 NOTE — ED Notes (Signed)
 PER [place] pt canhave a diet order when she passes swallow eval.  Cardiac.

## 2023-11-03 NOTE — ED Notes (Signed)
 Patient is resting comfortably at this time.  Support person at bedside. Breathing is even and unlabored call light within reach, encouraged to use when needs arise. Pt agreeable to this at this time.

## 2023-11-03 NOTE — H&P (Signed)
 History and Physical    Patient: [name] FMW:[id number] DOB: 1950/02/14 DOA: 11/03/2023 DOS: the patient was seen and examined on 11/03/2023 PCP: Corrington, Coye LABOR, MD  Patient coming from: Home  Chief Complaint:  Chief Complaint  Patient presents with   Numbness       HPI:  73 y.o. F with obesity, HTN, BrCA and DM presented with acute right sided facial numbness and right finger numbness.    Was in her USOH until last night, noticed right face numbness and right hand 1st and 2nd finger numbness.  No focal weakness, speech change.  This persisted overnight, and in the morning she had a strange feeling in her head, like a fullness, checked her blood pressure was over 200, so she came to the ER.  In the ER, blood pressure elevated.  Electrolytes and renal function normal.  CTH normal.  MRI brain showed subcentimeter left thalamic stroke.  Admitted for TIA work up.        Review of Systems  Constitutional:  Negative for fever and malaise/fatigue.  Neurological:  Positive for sensory change and headaches. Negative for dizziness, speech change, focal weakness, seizures, loss of consciousness and weakness.  All other systems reviewed and are negative.    Past Medical History:  Diagnosis Date   Arthritis    Breast cancer (HCC)    Breast cancer, right breast (HCC) 07/21/2011   GERD (gastroesophageal reflux disease)    Hypertension    Personal history of radiation therapy 2007   Pneumonia 1988   h/o   PONV (postoperative nausea and vomiting)    Primary localized osteoarthritis of right knee 12/26/2017   Past Surgical History:  Procedure Laterality Date   ABDOMINAL HYSTERECTOMY  1994   ANAL FISSURE REPAIR  1999   APPENDECTOMY  2015   BREAST BIOPSY Right 03/05/2005   BREAST LUMPECTOMY Right 03/25/2005   BREAST REDUCTION SURGERY  2011   KNEE ARTHROSCOPY Right    2013,2018   REDUCTION MAMMAPLASTY     TOTAL KNEE ARTHROPLASTY Right 12/26/2017   Procedure: RIGHT  TOTAL KNEE ARTHROPLASTY;  Surgeon: Jane Charleston, MD;  Location: [facility] OR;  Service: Orthopedics;  Laterality: Right;   Social History:  reports that she has never smoked. She has never used smokeless tobacco. She reports current alcohol use. She reports that she does not use drugs.  No Known Allergies  Family History  Problem Relation Age of Onset   Stroke Mother    Breast cancer Paternal Aunt    Breast cancer Paternal Aunt    Breast cancer Paternal Aunt    Stroke Maternal Grandmother     Prior to Admission medications   Medication Sig Start Date End Date Taking? Authorizing Provider  ALPRAZolam  (XANAX ) 0.5 MG tablet Take 0.25 mg by mouth daily as needed for anxiety or sleep. 06/17/11  Yes [provider]  CALCIUM-MAGNESIUM-ZINC PO Take 1 tablet by mouth daily.   Yes [provider]  cholecalciferol (VITAMIN D3) 25 MCG (1000 UNIT) tablet Take 1,000 Units by mouth daily.   Yes [provider]  diclofenac Sodium (VOLTAREN) 1 % GEL Apply 2 g topically 4 (four) times daily. 10/24/23  Yes [provider]  famotidine (PEPCID) 40 MG tablet Take 40 mg by mouth every evening. 10/24/23  Yes [provider]  furosemide  (LASIX ) 20 MG tablet Take 20 mg by mouth daily.   Yes [provider]  ibuprofen (ADVIL) 200 MG tablet Take 400 mg by mouth at bedtime.  Yes [provider]  losartan  (COZAAR ) 50 MG tablet Take 50 mg by mouth daily.   Yes [provider]  metFORMIN (GLUCOPHAGE-XR) 500 MG 24 hr tablet Take 500 mg by mouth daily with breakfast. 10/24/23  Yes [provider]  mometasone (ELOCON) 0.1 % cream Apply 1 Application topically daily as needed (eczema). 11/01/23  Yes [provider]  Multiple Vitamin (MULTIVITAMIN) tablet Take 1 tablet by mouth daily.   Yes [provider]  Omega-3 Fatty Acids (FISH OIL) 1200 MG CAPS Take 2,400 capsules by mouth daily.   Yes [provider]  pantoprazole   (PROTONIX ) 40 MG tablet Take 40 mg by mouth daily.   Yes [provider]  Probiotic Product (PROBIOTIC FORMULA PO) Take 1 tablet by mouth daily.   Yes [provider]  psyllium (REGULOID) 0.52 g capsule Take 3 capsules by mouth daily.   Yes [provider]    Physical Exam: Vitals:   11/03/23 1436 11/03/23 1500 11/03/23 1530 11/03/23 1730  BP: (!) 208/102 (!) 185/96 (!) 190/107 (!) 200/90  Pulse: (!) 106 (!) 108 (!) 106 (!) 117  Resp: (!) 21 17 17 17   Temp: 98.1 F (36.7 C)     TempSrc: Oral     SpO2: 99% 100% 95% 98%  Weight:      Height:       General appearance: Adult female,  alert and in no distress.  Responds appropriately to questions.  Eye contact, dress and hygiene appropriate. HEENT:  Anicteric, conjunctivae and sclerae normal without injection or icterus, lids and lashes normal.  Visual tracking smooth.  OP moist without lesions, dentition normal, lips normal, normal auditory acuity   Cor:  RRR, without murmurs, rubs.  JVP normal, no LE edema Resp:  Normal respiratory rate and rhythm.  CTAB without rales or wheezes. Abd:  No TTP or rebound all quadrants.   Neuro: Pupils are equal.  Extraocular movements are intact, without nystagmus.  Cranial nerve 5 is within normal limits.  Cranial nerve 7 is symmetrical.  Cranial nerve 8 is within normal limits.  Cranial nerves 9 and 10 reveal equal palate elevation.  Cranial nerve 11 reveals sternocleidomastoid strong.  Cranial nerve 12 is midline.  Motor strength testing is 5/5 in the upper and lower extremities bilaterally with normal motor, tone and bulk. There is mild numbness to light touch on the thumb and forefinger of right hand, slight abnormal feeling in right cheek.   The patient is oriented to time, place and person.  Speech is fluent.  Naming is grossly intact.  Recall, recent and remote, as well as general fund of knowledge seem within normal limits.  Attention span and concentration are within normal  limits.  Psych:  Attention span and concentration are within normal limits.  Affect normal.  appropriate thought content and normal rate of speech, thought process linear           Data Reviewed: Basic metabolic panel shows normal electrolytes and renal function, slight hyperglycemia CBC normal TSH normal CT head report reviewed, shows no acute intracranial process MRI brain, report reviewed, shows left thalamic infarct, 0.4 cm. EKG, personally reviewed, shows sinus tachycardia    Assessment and Plan: * Acute ischemic stroke (HCC) MRI brain showed subcentimeter left thalamic stroke - Non-invasive angiography ordered - Echocardiogram ordered - Lipids ordered - Aspirin  ordered - Start new lipitor - Permissive HTN - Evaluation for arrhythmia/atrial fibrillation: telemetry - tPA not given because symptoms too minor - Dysphagia screen  ordered in ER - PT eval ordered - Nonsmoker   Type 2 diabetes mellitus (HCC) A1c 5.6% earlier this month on metformin - Continue metformin  Class II obesity BMI 35.2, complicates cares  Essential hypertension Hypertensive urgency - Permissive HTN <220/120 overnight - Resume losartan  and furosemide  tomorrow         Advance Care Planning: Full code  Consults: Neurology, Dr. Merrianne  Family Communication: Daughter at the bedside  Severity of Illness: The appropriate patient status for this patient is OBSERVATION. Observation status is judged to be reasonable and necessary in order to provide the required intensity of service to ensure the patient's safety. The patient's presenting symptoms, physical exam findings, and initial radiographic and laboratory data in the context of their medical condition is felt to place them at decreased risk for further clinical deterioration. Furthermore, it is anticipated that the patient will be medically stable for discharge from the hospital within 2 midnights of admission.   Author: Lonni SHAUNNA Dalton, MD 11/03/2023 6:29 PM  For on call review [email or web address].

## 2023-11-03 NOTE — ED Provider Notes (Signed)
  Physical Exam  BP (!) 200/90 (BP Location: Left Arm)   Pulse (!) 117   Temp 98.1 F (36.7 C) (Oral)   Resp 17   Ht 5' 6 (1.676 m)   Wt 99 kg   SpO2 98%   BMI 35.23 kg/m   Physical Exam  Procedures  Procedures  ED Course / MDM    Medical Decision Making Care assumed at 3 PM.  Patient is here with right face and thumb numbness.  Signed out pending MRI brain  5:54 PM MRI showed 4 millimeter acute infarct in the left thalamus.  I notified Dr. Lindzen from neurology to see patient.  Hospitalist to admit  Problems Addressed: Cerebrovascular accident (CVA), unspecified mechanism (HCC): acute illness or injury  Amount and/or Complexity of Data Reviewed Labs: ordered. Radiology: ordered.  Risk Prescription drug management. Decision regarding hospitalization.          Alison Alm Macho, MD 11/03/23 [phone]

## 2023-11-03 NOTE — Assessment & Plan Note (Addendum)
 MRI brain showed subcentimeter left thalamic stroke - Non-invasive angiography ordered - Echocardiogram ordered - Lipids ordered - Aspirin  ordered - Start new lipitor - Permissive HTN - Evaluation for arrhythmia/atrial fibrillation: telemetry - tPA not given because symptoms too minor - Dysphagia screen ordered in ER - PT eval ordered - Nonsmoker

## 2023-11-03 NOTE — ED Notes (Signed)
 PER pt she is having tingling in her right lower side of lip and two fingers on her right hand.

## 2023-11-03 NOTE — ED Triage Notes (Addendum)
 Pt to ED via EMS with c/o right-sided facial numbness (around the mouth) and right thumb/right index finger numbness that started last night around 1800. Pt reports moving boxes this past weekend causing lower back pain. Pt denies pain at this time. Good grips, no facial droop. Pt A&Ox4.

## 2023-11-03 NOTE — Hospital Course (Signed)
 73 y.o. F with obesity, HTN, BrCA and DM presented with acute right sided facial numbness and right finger numbness.  CTH normal.  MRI brain showed subcentimeter left thalamic stroke.  Admitted for TIA work up.

## 2023-11-03 NOTE — Assessment & Plan Note (Signed)
 BMI 35.2, complicates cares

## 2023-11-04 ENCOUNTER — Observation Stay (HOSPITAL_COMMUNITY)

## 2023-11-04 ENCOUNTER — Observation Stay (HOSPITAL_BASED_OUTPATIENT_CLINIC_OR_DEPARTMENT_OTHER)

## 2023-11-04 ENCOUNTER — Other Ambulatory Visit (HOSPITAL_COMMUNITY): Payer: Self-pay

## 2023-11-04 DIAGNOSIS — Z7984 Long term (current) use of oral hypoglycemic drugs: Secondary | ICD-10-CM | POA: Diagnosis not present

## 2023-11-04 DIAGNOSIS — I6389 Other cerebral infarction: Secondary | ICD-10-CM | POA: Diagnosis not present

## 2023-11-04 DIAGNOSIS — I6381 Other cerebral infarction due to occlusion or stenosis of small artery: Secondary | ICD-10-CM | POA: Diagnosis not present

## 2023-11-04 DIAGNOSIS — R29701 NIHSS score 1: Secondary | ICD-10-CM | POA: Diagnosis not present

## 2023-11-04 DIAGNOSIS — I639 Cerebral infarction, unspecified: Secondary | ICD-10-CM | POA: Diagnosis not present

## 2023-11-04 DIAGNOSIS — E1151 Type 2 diabetes mellitus with diabetic peripheral angiopathy without gangrene: Secondary | ICD-10-CM

## 2023-11-04 LAB — CREATININE, SERUM
Creatinine, Ser: 0.62 mg/dL (ref 0.44–1.00)
GFR, Estimated: >60 mL/min (ref >60)

## 2023-11-04 LAB — CBC
HCT: 40.2 % (ref 36.0–46.0)
Hemoglobin: 13.9 g/dL (ref 12.0–15.0)
MCH: 30.4 pg (ref 26.0–34.0)
MCHC: 34.6 g/dL (ref 30.0–36.0)
MCV: 88 fL (ref 80.0–100.0)
Platelets: 278 K/uL (ref 150–400)
RBC: 4.57 MIL/uL (ref 3.87–5.11)
RDW: 13.4 % (ref 11.5–15.5)
WBC: 10.5 K/uL (ref 4.0–10.5)
nRBC: 0 % (ref 0.0–0.2)

## 2023-11-04 LAB — ECHOCARDIOGRAM COMPLETE
Area-P 1/2: 6.48 cm2
Est EF: >75
Height: 66 in
S' Lateral: 1.77 cm
Weight: 3492.09 [oz_av]

## 2023-11-04 LAB — LIPID PANEL
Cholesterol: 192 mg/dL (ref 0–200)
HDL: 55 mg/dL (ref >40)
LDL Cholesterol: 109 mg/dL — ABNORMAL HIGH (ref 0–99)
Total CHOL/HDL Ratio: 3.5 ratio
Triglycerides: 140 mg/dL (ref <150)
VLDL: 28 mg/dL (ref 0–40)

## 2023-11-04 MED ORDER — FISH OIL 1200 MG PO CAPS: 2400.0000 mg | ORAL_CAPSULE | Freq: Every day | ORAL | Status: AC

## 2023-11-04 MED ORDER — IOHEXOL 350 MG/ML SOLN
75.0000 mL | Freq: Once | INTRAVENOUS | Status: AC | PRN
  Administered 2023-11-04: 75 mL via INTRAVENOUS

## 2023-11-04 MED ORDER — CLOPIDOGREL BISULFATE 75 MG PO TABS
75.0000 mg | ORAL_TABLET | Freq: Every day | ORAL | Status: DC
  Administered 2023-11-04: 75 mg via ORAL
  Filled 2023-11-04: qty 1

## 2023-11-04 MED ORDER — ASPIRIN 81 MG PO TBEC: 81.0000 mg | DELAYED_RELEASE_TABLET | Freq: Every day | ORAL | Status: AC

## 2023-11-04 MED ORDER — ROSUVASTATIN CALCIUM 20 MG PO TABS
20.0000 mg | ORAL_TABLET | Freq: Every day | ORAL | 0 refills | Status: DC
  Filled 2023-11-04: qty 90, 90d supply, fill #0

## 2023-11-04 MED ORDER — CLOPIDOGREL BISULFATE 75 MG PO TABS
75.0000 mg | ORAL_TABLET | Freq: Every day | ORAL | 0 refills | Status: DC
  Filled 2023-11-04: qty 21, 21d supply, fill #0

## 2023-11-04 MED ORDER — ENOXAPARIN SODIUM 40 MG/0.4ML IJ SOSY
40.0000 mg | PREFILLED_SYRINGE | INTRAMUSCULAR | Status: DC
Start: 2023-11-04 — End: 2023-11-04

## 2023-11-04 NOTE — Plan of Care (Signed)
  Problem: Education: Goal: Knowledge of disease or condition will improve Outcome: Adequate for Discharge Goal: Knowledge of secondary prevention will improve (MUST DOCUMENT ALL) Outcome: Adequate for Discharge Goal: Knowledge of patient specific risk factors will improve (DELETE if not current risk factor) Outcome: Adequate for Discharge   Problem: Ischemic Stroke/TIA Tissue Perfusion: Goal: Complications of ischemic stroke/TIA will be minimized Outcome: Adequate for Discharge   Problem: Coping: Goal: Will verbalize positive feelings about self Outcome: Adequate for Discharge Goal: Will identify appropriate support needs Outcome: Adequate for Discharge   Problem: Health Behavior/Discharge Planning: Goal: Ability to manage health-related needs will improve Outcome: Adequate for Discharge Goal: Goals will be collaboratively established with patient/family Outcome: Adequate for Discharge   Problem: Self-Care: Goal: Ability to participate in self-care as condition permits will improve Outcome: Adequate for Discharge Goal: Verbalization of feelings and concerns over difficulty with self-care will improve Outcome: Adequate for Discharge Goal: Ability to communicate needs accurately will improve Outcome: Adequate for Discharge   Problem: Nutrition: Goal: Risk of aspiration will decrease Outcome: Adequate for Discharge Goal: Dietary intake will improve Outcome: Adequate for Discharge   Problem: Education: Goal: Knowledge of General Education information will improve Description: Including pain rating scale, medication(s)/side effects and non-pharmacologic comfort measures Outcome: Adequate for Discharge   Problem: Health Behavior/Discharge Planning: Goal: Ability to manage health-related needs will improve Outcome: Adequate for Discharge   Problem: Clinical Measurements: Goal: Ability to maintain clinical measurements within normal limits will improve Outcome: Adequate for  Discharge Goal: Will remain free from infection Outcome: Adequate for Discharge Goal: Diagnostic test results will improve Outcome: Adequate for Discharge Goal: Respiratory complications will improve Outcome: Adequate for Discharge Goal: Cardiovascular complication will be avoided Outcome: Adequate for Discharge   Problem: Activity: Goal: Risk for activity intolerance will decrease Outcome: Adequate for Discharge   Problem: Nutrition: Goal: Adequate nutrition will be maintained Outcome: Adequate for Discharge   Problem: Coping: Goal: Level of anxiety will decrease Outcome: Adequate for Discharge   Problem: Elimination: Goal: Will not experience complications related to bowel motility Outcome: Adequate for Discharge Goal: Will not experience complications related to urinary retention Outcome: Adequate for Discharge   Problem: Pain Managment: Goal: General experience of comfort will improve and/or be controlled Outcome: Adequate for Discharge   Problem: Safety: Goal: Ability to remain free from injury will improve Outcome: Adequate for Discharge   Problem: Skin Integrity: Goal: Risk for impaired skin integrity will decrease Outcome: Adequate for Discharge BP (!) 152/76 (BP Location: Left Arm)   Pulse (!) 106   Temp 98.2 F (36.8 C) (Oral)   Resp 18   Ht 5' 6 (1.676 m)   Wt 99 kg   SpO2 96%   BMI 35.23 kg/m  Pt D/C to home. A&OX4, No s/s of stroke. AVS reviewed follow up questions answered. IV and tele removed personal belongings returned. No other needs voiced at this time. [name] 11/04/23 3:53 PM

## 2023-11-04 NOTE — Progress Notes (Addendum)
 STROKE TEAM PROGRESS NOTE    SIGNIFICANT HOSPITAL EVENTS: 10/3 - Admitted   INTERIM HISTORY/SUBJECTIVE: Patient presenting with acute-onset right-sided perioral numbness, right thumb/index finger numbness. Denied motor weakness or vision changes. Described feeling a fullness in her head and her blood pressure was >200 prior to coming to hospital. Reported to ED provider that she had been moving boxes and having cervical pain with radiation to bilateral shoulders. NIHSS 1 on admission. On assessment today, patient continues to report ongoing sensory deficits of perioral area and R fingers as noted above.  Patient found to have 4 mm acute infarct of left thalamus on MRI. CT Head without acute abnormalities.  Did not give tPA as patient's reported symptoms were minor in severity. Patient started on DAPT and rosuvastatin.  LDL 109, A1C 5.6 Echo LVEF >75%, left atrial size normal.   OBJECTIVE:  CBC    Component Value Date/Time   WBC 10.5 11/04/2023 0136   RBC 4.57 11/04/2023 0136   HGB 13.9 11/04/2023 0136   HGB 13.2 07/14/2011 1353   HCT 40.2 11/04/2023 0136   HCT 38.8 07/14/2011 1353   PLT 278 11/04/2023 0136   PLT 240 07/14/2011 1353   MCV 88.0 11/04/2023 0136   MCV 91.1 07/14/2011 1353   MCH 30.4 11/04/2023 0136   MCHC 34.6 11/04/2023 0136   RDW 13.4 11/04/2023 0136   RDW 14.8 (H) 07/14/2011 1353   LYMPHSABS 1.2 11/03/2023 1101   LYMPHSABS 1.5 07/14/2011 1353   MONOABS 0.5 11/03/2023 1101   MONOABS 0.5 07/14/2011 1353   EOSABS 0.1 11/03/2023 1101   EOSABS 0.1 07/14/2011 1353   BASOSABS 0.1 11/03/2023 1101   BASOSABS 0.0 07/14/2011 1353    BMET    Component Value Date/Time   NA 137 11/03/2023 1101   K 3.8 11/03/2023 1101   CL 101 11/03/2023 1101   CO2 25 11/03/2023 1101   GLUCOSE 150 (H) 11/03/2023 1101   BUN 11 11/03/2023 1101   CREATININE 0.62 11/04/2023 0136   CALCIUM 10.1 11/03/2023 1101   GFRNONAA >60 11/04/2023 0136    IMAGING past 24  hours ECHOCARDIOGRAM COMPLETE Result Date: 11/04/2023    ECHOCARDIOGRAM REPORT   Patient Name:   [name] Date of Exam: 11/04/2023 Medical Rec #:  [id number]     Height:       66.0 in Accession #:    [id number]    Weight:       218.3 lb Date of Birth:  04/14/1950     BSA:          2.075 m Patient Age:    73 years      BP:           146/67 mmHg Patient Gender: F             HR:           103 bpm. Exam Location:  Inpatient Procedure: 2D Echo (Both Spectral and Color Flow Doppler were utilized during            procedure). Indications:    Stroke  History:        Patient has no prior history of Echocardiogram examinations.                 Risk Factors:Hypertension.  Sonographer:    Charmaine Gaskins Referring Phys: [id number] CHRISTOPHER P DANFORD  Sonographer Comments: Technically difficult study due to poor echo windows. IMPRESSIONS  1. Vigorous LV systolic function with turbulent flow through LV/LVOT.SABRA Left ventricular  ejection fraction, by estimation, is >75%. The left ventricle has hyperdynamic function. The left ventricle has no regional wall motion abnormalities. Indeterminate diastolic filling due to E-A fusion.  2. Right ventricular systolic function is normal. The right ventricular size is normal.  3. The mitral valve is normal in structure. Trivial mitral valve regurgitation.  4. The aortic valve is tricuspid. Aortic valve regurgitation is mild.  5. The inferior vena cava is normal in size with greater than 50% respiratory variability, suggesting right atrial pressure of 3 mmHg. FINDINGS  Left Ventricle: Vigorous LV systolic function with turbulent flow through LV/LVOT. Left ventricular ejection fraction, by estimation, is >75%. The left ventricle has hyperdynamic function. The left ventricle has no regional wall motion abnormalities. The left ventricular internal cavity size was normal in size. There is no left ventricular hypertrophy. Indeterminate diastolic filling due to E-A fusion. Right Ventricle: The  right ventricular size is normal. Right vetricular wall thickness was not assessed. Right ventricular systolic function is normal. Left Atrium: Left atrial size was normal in size. Right Atrium: Right atrial size was normal in size. Pericardium: There is no evidence of pericardial effusion. Mitral Valve: The mitral valve is normal in structure. Trivial mitral valve regurgitation. Tricuspid Valve: The tricuspid valve is normal in structure. Tricuspid valve regurgitation is trivial. Aortic Valve: The aortic valve is tricuspid. Aortic valve regurgitation is mild. Pulmonic Valve: The pulmonic valve was not well visualized. Pulmonic valve regurgitation is not visualized. No evidence of pulmonic stenosis. Aorta: The aortic root and ascending aorta are structurally normal, with no evidence of dilitation. Venous: The inferior vena cava is normal in size with greater than 50% respiratory variability, suggesting right atrial pressure of 3 mmHg. IAS/Shunts: No atrial level shunt detected by color flow Doppler.  LEFT VENTRICLE PLAX 2D LVIDd:         3.83 cm   Diastology LVIDs:         1.77 cm   LV e' medial:    9.36 cm/s LV PW:         0.95 cm   LV E/e' medial:  6.4 LV IVS:        0.97 cm   LV e' lateral:   10.90 cm/s LVOT diam:     2.13 cm   LV E/e' lateral: 5.5 LVOT Area:     3.56 cm  RIGHT VENTRICLE RV Basal diam:  2.31 cm RV Mid diam:    2.39 cm RV S prime:     15.00 cm/s LEFT ATRIUM             Index        RIGHT ATRIUM          Index LA diam:        3.12 cm 1.50 cm/m   RA Area:     8.23 cm LA Vol (A2C):   30.2 ml 14.55 ml/m  RA Volume:   11.30 ml 5.44 ml/m LA Vol (A4C):   28.5 ml 13.73 ml/m LA Biplane Vol: 30.8 ml 14.84 ml/m   AORTA Ao Asc diam: 3.06 cm MITRAL VALVE MV Area (PHT): 6.48 cm     SHUNTS MV Decel Time: 117 msec     Systemic Diam: 2.13 cm MV E velocity: 59.70 cm/s MV A velocity: 117.00 cm/s MV E/A ratio:  0.51 Vina Gull MD Electronically signed by Vina Gull MD Signature Date/Time: 11/04/2023/2:43:30 PM     Final    CT CHEST W CONTRAST Result Date: 11/04/2023 EXAM: CT CHEST  WITH CONTRAST 11/04/2023 09:06:47 AM TECHNIQUE: CT of the chest was performed with the administration of 75 mL of iohexol (OMNIPAQUE) 350 MG/ML injection. Multiplanar reformatted images are provided for review. Automated exposure control, iterative reconstruction, and/or weight based adjustment of the mA/kV was utilized to reduce the radiation dose to as low as reasonably achievable. COMPARISON: None available. CLINICAL HISTORY: Abnormal xray - lung nodule, >= 1 cm. FINDINGS: MEDIASTINUM: Heart and pericardium are unremarkable. The central airways are clear. LYMPH NODES: No mediastinal, hilar or axillary lymphadenopathy. LUNGS AND PLEURA: Central right upper lobe nodule adjacent to the bronchus measures 15 mm on image 45 series 5. No pulmonary edema. No pleural effusion or pneumothorax. SOFT TISSUES/BONES: Degenerative spurring of the spine. No acute abnormality of the soft tissues. UPPER ABDOMEN: Limited images of the upper abdomen demonstrates no acute abnormality. IMPRESSION: 1. Central right upper lobe 15 mm pulmonary nodule adjacent to the bronchus; recommend dedicated follow-up with PET/CT and/or tissue sampling for further characterization. 2. These results will be called to the ordering clinician or representative by the radiologist assistant, and communication documented in the pacs or clario dashboard Electronically signed by: Norleen Boxer MD 11/04/2023 09:43 AM EDT RP Workstation: [id number]   CT ANGIO HEAD NECK W WO CM Result Date: 11/03/2023 EXAM: CTA HEAD AND NECK WITH AND WITHOUT 11/03/2023 05:44:00 PM TECHNIQUE: CTA of the head and neck was performed with and without the administration of intravenous contrast (iohexol (OMNIPAQUE) 350 MG/ML injection 75 mL IOHEXOL 350 MG/ML SOLN). Multiplanar 2D and/or 3D reformatted images are provided for review. Automated exposure control, iterative reconstruction, and/or weight based  adjustment of the mA/kV was utilized to reduce the radiation dose to as low as reasonably achievable. Stenosis of the internal carotid arteries measured using NASCET criteria. COMPARISON: Same day CT head and MRI head CLINICAL HISTORY: Stroke/TIA, determine embolic source. Numbness. Pt to ED via EMS with c/o right-sided facial numbness (around the mouth) and right thumb/right index finger numbness that started last night around 1800. Pt reports moving boxes this past weekend causing lower back pain. Pt denies pain at this time. Good grips, no facial droop. Pt A\T\Ox4. FINDINGS: CTA NECK: AORTIC ARCH AND ARCH VESSELS: Minimal atherosclerosis along the visualized distal aortic arch. Minimal atherosclerosis at the left subclavian artery origin without stenosis. No dissection or arterial injury. No significant stenosis of the brachiocephalic or subclavian arteries. CERVICAL CAROTID ARTERIES: The right carotid artery is patent from the origin to the skull base. Calcified atherosclerosis at the right carotid bifurcation without hemodynamically significant stenosis. The left carotid artery is patent from the origin to the skull base. Calcified atherosclerosis at the left carotid bifurcation without hemodynamically significant stenosis. No dissection or arterial injury. CERVICAL VERTEBRAL ARTERIES: No dissection, arterial injury, or significant stenosis. LUNGS AND MEDIASTINUM: Abnormal soft tissue adjacent to the bronchovascular bundle in the medial right upper lobe just superior to the right hilum which could reflect a pulmonary nodule/mass versus enlarged lymph nodes recommend correlation with dedicated CT chest. SOFT TISSUES: No acute abnormality. BONES: Degenerative changes in the visualized spine. CTA HEAD: ANTERIOR CIRCULATION: The intracranial internal carotid arteries are patent bilaterally. Atherosclerosis of the bilateral carotid siphons. Mild stenosis of the right supraclinoid ICA. The middle cerebral arteries  are patent bilaterally. There is tapering and mild stenosis of the distal M1 segment left MCA. Additional mild stenosis of a proximal M2 branch of the left MCA. The anterior cerebral arteries are patent bilaterally. No aneurysm. POSTERIOR CIRCULATION: The posterior cerebral arteries are patent bilaterally.  There is moderate to severe stenosis of the P2 segment left PCA. The superior cerebellar arteries are patent bilaterally. The basilar artery is patent. The posterior inferior cerebellar arteries are patent bilaterally. No significant stenosis of the vertebral arteries. No aneurysm. OTHER: No dural venous sinus thrombosis on this non-dedicated study. IMPRESSION: 1. No large vessel occlusion. 2. Mild stenosis of the right supraclinoid ICA. 3. Tapering and mild stenosis of the distal M1 segment left MCA and additional mild stenosis of a proximal M2 branch of the left MCA. 4. Moderate to severe stenosis of the P2 segment left PCA. 5. Abnormal soft tissue adjacent to the bronchovascular bundle in the medial right upper lobe just superior to the right hilum, which could reflect a pulmonary nodule/mass versus enlarged lymph nodes. Recommend correlation with dedicated CT chest. Electronically signed by: Donnice Mania MD 11/03/2023 06:32 PM EDT RP Workstation: [id number]   MR BRAIN WO CONTRAST Result Date: 11/03/2023 CLINICAL DATA:  Provided history: Neuro deficit, acute, stroke suspected. EXAM: MRI HEAD WITHOUT CONTRAST TECHNIQUE: Multiplanar, multiecho pulse sequences of the brain and surrounding structures were obtained without intravenous contrast. COMPARISON:  Head CT 11/03/2023. FINDINGS: Brain: No age-advanced or lobar predominant cerebral atrophy. 4 mm focus of restricted diffusion within the left thalamus consistent with an acute infarct. Chronic lacunar infarct within the left caudate head. Mild-to-moderate ill-defined T2 FLAIR hyperintense signal abnormality within the pons, nonspecific but compatible with  chronic small vessel ischemic disease. No cortical encephalomalacia is identified. No significant cerebral white matter disease for age. No evidence of an intracranial mass. No chronic intracranial blood products. No extra-axial fluid collection. No midline shift. Vascular: Maintained flow voids within the proximal large arterial vessels. Skull and upper cervical spine: No focal worrisome marrow lesion. Sinuses/Orbits: No mass or acute finding within the imaged orbits. No significant paranasal sinus disease. IMPRESSION: 1. 4 mm acute infarct within the left thalamus. 2. Chronic lacunar infarct within the left caudate head. 3. Mild-to-moderate chronic small vessel ischemic changes within the pons. Electronically Signed   By: Rockey Childs D.O.   On: 11/03/2023 16:41    Vitals:   11/04/23 0408 11/04/23 0827 11/04/23 1127 11/04/23 1533  BP: (!) 146/67 (!) 170/73 (!) 174/88 (!) 152/76  Pulse: (!) 110 (!) 110 (!) 103 (!) 106  Resp: 18 17  18   Temp: 98.4 F (36.9 C) 99.3 F (37.4 C) 98.4 F (36.9 C) 98.2 F (36.8 C)  TempSrc: Oral Oral Oral Oral  SpO2: 96% 96% 97% 96%  Weight:      Height:         PHYSICAL EXAM: General:  Alert, well-nourished, well-developed patient in no acute distress CV: Regular rate and rhythm on monitor Respiratory: Regular, unlabored respirations on room air   NEURO:  Mental Status: AA&Ox3, patient is able to give clear and coherent history, able to follow commands Speech/Language: speech is without dysarthria or aphasia. Naming, repetition, fluency, and comprehension intact.  Cranial Nerves:  II: PERRL. Visual fields full.  III, IV, VI: EOMI. Eyelids elevate symmetrically.  V: Sensation is intact to light touch and symmetrical to face. Perioral numbness VII: Face is symmetrical resting and smiling VIII: hearing intact to voice. IX, X: Palate elevates symmetrically. Phonation is normal.  KP:Dynloizm shrug 5/5. XII: tongue is midline without  fasciculations. Motor: 5/5 strength to all muscle groups tested.  Tone: is normal and bulk is normal Sensation- Intact to light touch bilaterally. Extinction absent to light touch to DSS. She states her index and thumb on  the right hand feel numb and perioral area feels numb Coordination: FTN intact bilaterally, HKS: no ataxia in BLE.No drift.  Gait- deferred  Most Recent NIH 1   ASSESSMENT/PLAN:  [name] is a 73 y.o. female with history of breast CA s/p radiation (in remission), GERD, HTN, T2DM admitted for R facial/ perioral numbness and right thumb/index finger numbness. NIH on Admission 1.  Stroke: acute punctate left thalamic infarct Etiology:  thrombotic  Code Stroke CT head No acute abnormality. ASPECTS 10.    CTA head & neck: No LVO. Mild stenosis of the right supraclinoid ICA. Mild stenosis of the distal M1 segment left MCA and additional mild stenosis of a proximal M2 branch of the left MCA. Moderate to severe stenosis of the P2 segment left PCA.  MRI: 4 mm acute infarct within the left thalamus. Chronic lacunar infarct within the left caudate head. Mild-to-moderate chronic small vessel ischemic changes within the pons. 2D Echo LVEF >75%, left atrial size normal LDL 109 HgbA1c 5.6 VTE prophylaxis - Lovenox No antithrombotic prior to admission, now on aspirin  81 mg daily and clopidogrel 75 mg daily for 3 weeks and then aspirin  alone. Therapy recommendations:  No follow up needed  Disposition:  Home  Hypertension Home meds: Losartan  50 mg daily, Lasix  20 mg daily Stable Blood Pressure Goal: SBP between 130-150 for 24 hours and then less than 160   Hyperlipidemia Home meds: None LDL 109, goal < 70 Continue Rosuvastatin 20 mg daily  Continue statin at discharge  Diabetes type II Controlled Home meds:  Metformin HgbA1c: 5.6 goal < 7.0  Other Stroke Risk Factors Advanced age >/= 6 Obesity, Body mass index is 35.23 kg/m., BMI >/= 30 associated with increased  stroke risk, recommend weight loss, diet and exercise as appropriate  Family hx stroke (Mother and maternal grandmother) Obstructive sleep apnea  Hospital day # 0  I have personally obtained history,examined this patient, reviewed notes, independently viewed imaging studies, participated in medical decision making and plan of care.ROS completed by me personally and pertinent positives fully documented  I have made any additions or clarifications directly to the above note. Agree with note above.  Patient presented with sudden onset of right perioral and hand paresthesias which are improving but not back to baseline.  MRI shows a small left thalamic lacunar infarct likely from small vessel disease.  Recommend aspirin  and Plavix for 3 weeks followed by aspirin  alone and aggressive risk factor modification.  Statin for elevated lipids patient appears to be at risk for obstructive sleep apnea and was offered a chance to volunteer and participate in the sleep smart study but after reviewing information she declined.  Follow-up as an outpatient stroke clinic in 2 months with nurse practitioner.  Discussed with Dr. Jonel   I personally spent a total of 50 minutes in the care of the patient today including getting/reviewing separately obtained history, performing a medically appropriate exam/evaluation, counseling and educating, placing orders, referring and communicating with other health care professionals, documenting clinical information in the EHR, independently interpreting results, and coordinating care.        Eather Popp, MD Medical Director [facility] Pager: [phone] 11/04/2023 4:54 PM   To contact Stroke Continuity provider, please refer to [email or web address]. After hours, contact General Neurology

## 2023-11-04 NOTE — Evaluation (Signed)
 Occupational Therapy Evaluation Patient Details Name: [name] MRN: [id number] DOB: [birth year] Today's Date: 11/04/2023   History of Present Illness   73 y.o. female presents to [facility] 11/03/23 with acute R sided facial numbness and R finger numbness. 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 PMHx: obesity, HTN, BrCA and DM     Clinical Impressions Pt admitted for the above details. Pt greeted in supine, agreeable for OT visit; multiple family members at bedside. PTA, pt was fully indep with ADLs, IADLs, and driving. Daughter just moved in with her ~1 week PTA. Functionally, pt is independent with UB/LB ADLs and functional ambulation without AD. Endorses mild residual numbness/reduced sensation to light touch in R 1-2 digits. Educated on desensitization activities to complete at home to improve sensory feedback. Pt agreeable with OT recs.  Pt is near her functional baseline, no further acute OT services indicated at this time. No follow-up OT services nor DME needs.     If plan is discharge home, recommend the following:   Assistance with cooking/housework (PRN assist)     Functional Status Assessment         Equipment Recommendations   None recommended by OT     Recommendations for Other Services         Precautions/Restrictions   Precautions Precautions: None Restrictions Weight Bearing Restrictions Per Provider Order: No     Mobility Bed Mobility Overal bed mobility: Independent                  Transfers Overall transfer level: Independent Equipment used: None                      Balance                                           ADL either performed or assessed with clinical judgement   ADL Overall ADL's : Modified independent                                             Vision Patient Visual Report: No change from baseline Vision Assessment?: No apparent visual deficits      Perception         Praxis         Pertinent Vitals/Pain Pain Assessment Pain Assessment: No/denies pain     Extremity/Trunk Assessment Upper Extremity Assessment Upper Extremity Assessment: Overall WFL for tasks assessed (residual mild R 1-2 digit numbness/reduced LT sensation, generally functional)           Communication Communication Communication: No apparent difficulties   Cognition Arousal: Alert Behavior During Therapy: WFL for tasks assessed/performed Cognition: No apparent impairments             OT - Cognition Comments: pleasant & participatory                 Following commands: Intact       Cueing  General Comments   Cueing Techniques: Verbal cues  supportive family members present during session   Exercises     Shoulder Instructions      Home Living Family/patient expects to be discharged to:: Private residence Living Arrangements: Children Available Help at Discharge: Family Type of Home: House  Bathroom Shower/Tub: Producer, television/film/video: Handicapped height     Home Equipment: Agricultural consultant (2 wheels);Cane - single point;BSC/3in1;Grab bars - toilet;Grab bars - tub/shower;Hand held shower head;Shower seat          Prior Functioning/Environment Prior Level of Function : Independent/Modified Independent;Driving             Mobility Comments: none PTA ADLs Comments: retired Charity fundraiser, enjoys shopping    OT Problem List: Impaired sensation   OT Treatment/Interventions:        OT Goals(Current goals can be found in the care plan section)   Acute Rehab OT Goals Patient Stated Goal: to go hom OT Goal Formulation: With patient   OT Frequency:       Co-evaluation              AM-PAC OT 6 Clicks Daily Activity     Outcome Measure Help from another person eating meals?: None Help from another person taking care of personal grooming?: None Help from another person toileting, which  includes using toliet, bedpan, or urinal?: None Help from another person bathing (including washing, rinsing, drying)?: None Help from another person to put on and taking off regular upper body clothing?: None Help from another person to put on and taking off regular lower body clothing?: None 6 Click Score: 24   End of Session    Activity Tolerance: Patient tolerated treatment well Patient left: in bed;with call bell/phone within reach;with family/visitor present  OT Visit Diagnosis: Unsteadiness on feet (R26.81);Other (comment) (impaired sensation)                Time: 8764-8752 OT Time Calculation (min): 12 min Charges:  OT General Charges $OT Visit: 1 Visit OT Evaluation $OT Eval Low Complexity: 1 Low  Rikki BIRCH., MSOT, OTR/L Acute Rehabilitation Services [phone] Secure Chat Preferred  Rikki Milch 11/04/2023, 1:33 PM

## 2023-11-04 NOTE — Progress Notes (Signed)
 Physical Therapy Brief Evaluation and Discharge Note Patient Details Name: [name] MRN: [id number] DOB: Mar 05, 1950 Today's Date: 11/04/2023   History of Present Illness  73 y.o. female presents to [facility] 11/03/23 with acute R sided facial numbness and R finger numbness. 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 PMHx: obesity, HTN, BrCA and DM   Clinical Impression  PTA pt was independent for mobility with no AD. Pt reports being at functional mobility baseline being independent to ambulate 363ft and negotiate 10 steps with one handrail. Pt's residual symptoms include numbness in R thumb and 2nd digit and R corner of her mouth. Pt is a retired [employer] fund[employer] and is aware of the signs/symptoms of a stroke. Pt has intermittent assist available upon d/c home and feels comfortable discharging when medically stable. No further acute or post-acute PT needs identified. Acute PT signing off.        PT Assessment Patient does not need any further PT services  Assistance Needed at Discharge  None    Equipment Recommendations None recommended by PT     Precautions/Restrictions Precautions Precautions: None Restrictions Weight Bearing Restrictions Per Provider Order: No        Mobility  Bed Mobility   Supine/Sidelying to sit: Independent     Transfers Overall transfer level: Independent Equipment used: None     Ambulation/Gait Ambulation/Gait assistance: Independent Gait Distance (Feet): 350 Feet Assistive device: None Gait Pattern/deviations: WFL(Within Functional Limits)       Stairs Stairs: Yes Stairs assistance: Modified independent (Device/Increase time) Stair Management: One rail Right Number of Stairs: 10 General stair comments: steady with use of one handrail  Modified Rankin (Stroke Patients Only) Modified Rankin (Stroke Patients Only) Pre-Morbid Rankin Score: No symptoms Modified Rankin: No symptoms      Balance Overall balance assessment: Independent        Pertinent Vitals/Pain PT - Brief Vital Signs All Vital Signs Stable: Yes Pain Assessment Pain Assessment: No/denies pain     Home Living Family/patient expects to be discharged to:: Private residence Living Arrangements: Children Available Help at Discharge: Family;Available PRN/intermittently Home Environment: Stairs to enter;Rail - right  Stairs-Number of Steps: 14 Home Equipment: Rolling Walker (2 wheels);Cane - single point;BSC/3in1        Prior Function Level of Independence: Independent      UE/LE Assessment   UE ROM/Strength/Tone/Coordination: Impaired UE ROM/Strength/Tone/Coordination Deficits: Numb sensation in R thumb and 2nd digit, alert to light touch  LE ROM/Strength/Tone/Coordination: [facility]      Communication   Communication Communication: No apparent difficulties     Cognition Overall Cognitive Status: Appears within functional limits for tasks assessed/performed        PT Visit Diagnosis Other abnormalities of gait and mobility (R26.89)    No Skilled PT All education completed;Patient at baseline level of functioning;Patient is independent with all acitivity/mobility    AMPAC 6 Clicks Help needed turning from your back to your side while in a flat bed without using bedrails?: None Help needed moving from lying on your back to sitting on the side of a flat bed without using bedrails?: None Help needed moving to and from a bed to a chair (including a wheelchair)?: None Help needed standing up from a chair using your arms (e.g., wheelchair or bedside chair)?: None Help needed to walk in hospital room?: None Help needed climbing 3-5 steps with a railing? : None 6 Click Score: 24      End of Session Equipment Utilized During Treatment: Gait belt Activity  Tolerance: Patient tolerated treatment well Patient left: with call bell/phone within reach;in bed Nurse Communication: Mobility status PT Visit Diagnosis: Other abnormalities of gait and  mobility (R26.89)     Time: 9152-9145 10:31-10:39  PT Time Calculation (min) (ACUTE ONLY): 15 min  Charges:   PT Evaluation $PT Eval Low Complexity: 1 Low     Kate ORN, PT, DPT Secure Chat Preferred  Rehab Office [phone]   Kate BRAVO Wendolyn  11/04/2023, 11:33 AM

## 2023-11-04 NOTE — Discharge Summary (Signed)
 Physician Discharge Summary   Patient: [name] MRN: [id number] DOB: 1950-08-25  Admit date:     11/03/2023  Discharge date: 11/04/23  Discharge Physician: Lonni SHAUNNA Dalton   PCP: Corrington, Kip A, MD     Recommendations at discharge:  Follow up with Neurology Dr. Rosemarie in 6-8 weeks for stroke Follow up with [facility] for incidental RUL nodule, referral sent Follow up with PCP Dr. Bobbette for stroke and hypertension in 1 week Dr. Bobbette: Please check BP in 1 week, titrate antihypertensives for target <130/80 Please check Lipids in 2-3 months on new Crestor     Discharge Diagnoses: Principal Problem:   Acute ischemic stroke [facility]) Active Problems:   Essential hypertension   Class II obesity   Type 2 diabetes mellitus (HCC)   Obstructive sleep apnea     Hospital Course: 73 y.o. F with obesity, HTN, BrCA and DM presented with acute right sided facial numbness and right finger numbness.  CTH normal.  MRI brain showed subcentimeter left thalamic stroke.  Admitted for TIA work up.     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  There are some scattered intracranial stenosis.  Echocardiogram showed no cardiovascular embolic source.  Lipid showed LDL 109.  Crestor started  Discharged on aspirin  and Plavix for 3 weeks followed by aspirin  alone indefinitely as well as no Crestor  No atrial fibrillation on telemetry.  PT recommended no particular follow-up.    Type 2 diabetes mellitus (HCC) At goal  Class II obesity  Essential hypertension Blood pressure elevated to 200 on admission, home medications resumed, blood pressure improved.  Recommend gradual control over the next week, titration with PCP in 1 week for target less than 130/80  Lung nodule This was an incidental finding.  There is a 15 mm right upper lobe lung nodule adjacent to the bronchus.   Referral has been sent to the pulmonary lung nodule clinic for follow-up PET/CT vs sampling.          The [place]  Controlled Substances Registry was reviewed for this patient prior to discharge.  Consultants: Neurology Procedures performed: MRI brain CT angiogram head and neck Echocardiogram  Disposition: Home Diet recommendation:  Discharge Diet Orders (From admission, onward)     Start     Ordered   11/04/23 0000  Diet - low sodium heart healthy        11/04/23 1543             DISCHARGE MEDICATION: Allergies as of 11/04/2023   No Known Allergies      Medication List     TAKE these medications    ALPRAZolam  0.5 MG tablet Commonly known as: XANAX  Take 0.25 mg by mouth daily as needed for anxiety or sleep.   aspirin  EC 81 MG tablet Take 1 tablet (81 mg total) by mouth daily. Swallow whole. Start taking on: November 05, 2023   CALCIUM-MAGNESIUM-ZINC PO Take 1 tablet by mouth daily.   cholecalciferol 25 MCG (1000 UNIT) tablet Commonly known as: VITAMIN D3 Take 1,000 Units by mouth daily.   clopidogrel 75 MG tablet Commonly known as: PLAVIX Take 1 tablet (75 mg total) by mouth daily. Start taking on: November 05, 2023   diclofenac Sodium 1 % Gel Commonly known as: VOLTAREN Apply 2 g topically 4 (four) times daily.   famotidine 40 MG tablet Commonly known as: PEPCID Take 40 mg by mouth every evening.  Fish Oil 1200 MG Caps Take 2 capsules (2,400 mg total) by mouth daily. What changed: how much to take   furosemide  20 MG tablet Commonly known as: LASIX  Take 20 mg by mouth daily.   ibuprofen 200 MG tablet Commonly known as: ADVIL Take 400 mg by mouth at bedtime.   losartan  50 MG tablet Commonly known as: COZAAR  Take 50 mg by mouth daily.   metFORMIN 500 MG 24 hr tablet Commonly known as: GLUCOPHAGE-XR Take 500 mg by mouth daily with breakfast.   mometasone 0.1 % cream Commonly known as: ELOCON Apply 1 Application topically daily  as needed (eczema).   multivitamin tablet Take 1 tablet by mouth daily.   pantoprazole  40 MG tablet Commonly known as: PROTONIX  Take 40 mg by mouth daily.   PROBIOTIC FORMULA PO Take 1 tablet by mouth daily.   psyllium 0.52 g capsule Commonly known as: REGULOID Take 3 capsules by mouth daily.   rosuvastatin 20 MG tablet Commonly known as: CRESTOR Take 1 tablet (20 mg total) by mouth at bedtime.        Follow-up Information     [facility] Follow up.   Specialty: Pulmonology Why: Option 1 Contact information: [address] [place]  [address] [phone] Additional information: [address]  [address]  [place], [place] [address]        Corrington, Kip A, MD. Schedule an appointment as soon as possible for a visit in 1 week(s).   Specialty: Family Medicine Contact information: [address] [place] [address] [phone]         [facility]. Schedule an appointment as soon as possible for a visit in 2 month(s).   Contact information: [address]     [address] [place]  [address] [phone]                Discharge Instructions     AMB  Referral to Pulmonary Nodule Clinic   Complete by: As directed    Diet - low sodium heart healthy   Complete by: As directed    Discharge instructions   Complete by: As directed    **IMPORTANT DISCHARGE INSTRUCTIONS**   From Dr. Jonel: You were evaluated for face and finger numbness, which we discovered here was from a stroke  This stroke was visualized on an MRI of the brain  You also had heart rhythm monitoring, an echocardiogram and imaging of the arteries of the head and neck that were normal  We strongly suspect, based on that work up and on the location that this was a small vessel stroke, due to risk factors like high blood pressure, cholesterol, inflammation, and blood  sugar  Your sugars are well controlled, continue metformin  For cholesterol, take rosuvastatin/Crestor 20 mg nightly from now on  For blood pressure, continue your medicines furosemide  and losartan  and go see Dr. Bobbette in 1 week  For reducing inflammation, adhere to a diet low in processed foods and sugars, high in fruits and vegetables  For preventing platelet clumping that might provoke another stroke: Take aspirin  and Plavix for 3 weeks together, then stop Plavix and take aspirin  81 mg alone   Go see Neurology Dr. Rosemarie at [facility] in 6-8 weeks    Totally unrelated, but we discovered a small lung nodule on your CAT scan just by chance.  This may be nothing, but you should follow up with  Pulmonology I have sent the referral.   If you haven't heard from them in 1 week, call the number for [facility] below (select Option 1 for scheduling)   Increase activity slowly   Complete by: As directed        Discharge Exam: Filed Weights   11/03/23 1014  Weight: 99 kg    General: Pt is alert, awake, not in acute distress Cardiovascular: RRR, nl S1-S2, no murmurs appreciated.   No LE edema.   Respiratory: Normal respiratory rate and rhythm.  CTAB without rales or wheezes. Abdominal: Abdomen soft and non-tender.  No distension or HSM.   Neuro/Psych: Strength symmetric in upper and lower extremities.  Judgment and insight appear normal.   Condition at discharge: good  The results of significant diagnostics from this hospitalization (including imaging, microbiology, ancillary and laboratory) are listed below for reference.   Imaging Studies: ECHOCARDIOGRAM COMPLETE Result Date: 11/04/2023    ECHOCARDIOGRAM REPORT   Patient Name:   ROMONA MURDY Date of Exam: 11/04/2023 Medical Rec #:  [id number]     Height:       66.0 in Accession #:    [id number]    Weight:       218.3 lb Date of Birth:  December 01, 1950     BSA:          2.075 m Patient Age:    73 years       BP:           146/67 mmHg Patient Gender: F             HR:           103 bpm. Exam Location:  Inpatient Procedure: 2D Echo (Both Spectral and Color Flow Doppler were utilized during            procedure). Indications:    Stroke  History:        Patient has no prior history of Echocardiogram examinations.                 Risk Factors:Hypertension.  Sonographer:    Charmaine Gaskins Referring Phys: [id number] Maryjane Benedict P Saphyre Cillo  Sonographer Comments: Technically difficult study due to poor echo windows. IMPRESSIONS  1. Vigorous LV systolic function with turbulent flow through LV/LVOT.SABRA Left ventricular ejection fraction, by estimation, is >75%. The left ventricle has hyperdynamic function. The left ventricle has no regional wall motion abnormalities. Indeterminate diastolic filling due to E-A fusion.  2. Right ventricular systolic function is normal. The right ventricular size is normal.  3. The mitral valve is normal in structure. Trivial mitral valve regurgitation.  4. The aortic valve is tricuspid. Aortic valve regurgitation is mild.  5. The inferior vena cava is normal in size with greater than 50% respiratory variability, suggesting right atrial pressure of 3 mmHg. FINDINGS  Left Ventricle: Vigorous LV systolic function with turbulent flow through LV/LVOT. Left ventricular ejection fraction, by estimation, is >75%. The left ventricle has hyperdynamic function. The left ventricle has no regional wall motion abnormalities. The left ventricular internal cavity size was normal in size. There is no left ventricular hypertrophy. Indeterminate diastolic filling due to E-A fusion. Right Ventricle: The right ventricular size is normal. Right vetricular wall thickness was not assessed. Right ventricular systolic function is normal. Left Atrium: Left atrial size was normal in size. Right Atrium: Right atrial size was normal in size. Pericardium: There is no evidence of pericardial effusion. Mitral Valve: The mitral valve  is normal in structure. Trivial  mitral valve regurgitation. Tricuspid Valve: The tricuspid valve is normal in structure. Tricuspid valve regurgitation is trivial. Aortic Valve: The aortic valve is tricuspid. Aortic valve regurgitation is mild. Pulmonic Valve: The pulmonic valve was not well visualized. Pulmonic valve regurgitation is not visualized. No evidence of pulmonic stenosis. Aorta: The aortic root and ascending aorta are structurally normal, with no evidence of dilitation. Venous: The inferior vena cava is normal in size with greater than 50% respiratory variability, suggesting right atrial pressure of 3 mmHg. IAS/Shunts: No atrial level shunt detected by color flow Doppler.  LEFT VENTRICLE PLAX 2D LVIDd:         3.83 cm   Diastology LVIDs:         1.77 cm   LV e' medial:    9.36 cm/s LV PW:         0.95 cm   LV E/e' medial:  6.4 LV IVS:        0.97 cm   LV e' lateral:   10.90 cm/s LVOT diam:     2.13 cm   LV E/e' lateral: 5.5 LVOT Area:     3.56 cm  RIGHT VENTRICLE RV Basal diam:  2.31 cm RV Mid diam:    2.39 cm RV S prime:     15.00 cm/s LEFT ATRIUM             Index        RIGHT ATRIUM          Index LA diam:        3.12 cm 1.50 cm/m   RA Area:     8.23 cm LA Vol (A2C):   30.2 ml 14.55 ml/m  RA Volume:   11.30 ml 5.44 ml/m LA Vol (A4C):   28.5 ml 13.73 ml/m LA Biplane Vol: 30.8 ml 14.84 ml/m   AORTA Ao Asc diam: 3.06 cm MITRAL VALVE MV Area (PHT): 6.48 cm     SHUNTS MV Decel Time: 117 msec     Systemic Diam: 2.13 cm MV E velocity: 59.70 cm/s MV A velocity: 117.00 cm/s MV E/A ratio:  0.51 Vina Gull MD Electronically signed by Vina Gull MD Signature Date/Time: 11/04/2023/2:43:30 PM    Final    CT CHEST W CONTRAST Result Date: 11/04/2023 EXAM: CT CHEST WITH CONTRAST 11/04/2023 09:06:47 AM TECHNIQUE: CT of the chest was performed with the administration of 75 mL of iohexol (OMNIPAQUE) 350 MG/ML injection. Multiplanar reformatted images are provided for review. Automated exposure control, iterative  reconstruction, and/or weight based adjustment of the mA/kV was utilized to reduce the radiation dose to as low as reasonably achievable. COMPARISON: None available. CLINICAL HISTORY: Abnormal xray - lung nodule, >= 1 cm. FINDINGS: MEDIASTINUM: Heart and pericardium are unremarkable. The central airways are clear. LYMPH NODES: No mediastinal, hilar or axillary lymphadenopathy. LUNGS AND PLEURA: Central right upper lobe nodule adjacent to the bronchus measures 15 mm on image 45 series 5. No pulmonary edema. No pleural effusion or pneumothorax. SOFT TISSUES/BONES: Degenerative spurring of the spine. No acute abnormality of the soft tissues. UPPER ABDOMEN: Limited images of the upper abdomen demonstrates no acute abnormality. IMPRESSION: 1. Central right upper lobe 15 mm pulmonary nodule adjacent to the bronchus; recommend dedicated follow-up with PET/CT and/or tissue sampling for further characterization. 2. These results will be called to the ordering clinician or representative by the radiologist assistant, and communication documented in the pacs or clario dashboard Electronically signed by: Norleen Boxer MD 11/04/2023 09:43 AM EDT RP Workstation: [id number]  CT ANGIO HEAD NECK W WO CM Result Date: 11/03/2023 EXAM: CTA HEAD AND NECK WITH AND WITHOUT 11/03/2023 05:44:00 PM TECHNIQUE: CTA of the head and neck was performed with and without the administration of intravenous contrast (iohexol (OMNIPAQUE) 350 MG/ML injection 75 mL IOHEXOL 350 MG/ML SOLN). Multiplanar 2D and/or 3D reformatted images are provided for review. Automated exposure control, iterative reconstruction, and/or weight based adjustment of the mA/kV was utilized to reduce the radiation dose to as low as reasonably achievable. Stenosis of the internal carotid arteries measured using NASCET criteria. COMPARISON: Same day CT head and MRI head CLINICAL HISTORY: Stroke/TIA, determine embolic source. Numbness. Pt to ED via EMS with c/o right-sided  facial numbness (around the mouth) and right thumb/right index finger numbness that started last night around 1800. Pt reports moving boxes this past weekend causing lower back pain. Pt denies pain at this time. Good grips, no facial droop. Pt A\T\Ox4. FINDINGS: CTA NECK: AORTIC ARCH AND ARCH VESSELS: Minimal atherosclerosis along the visualized distal aortic arch. Minimal atherosclerosis at the left subclavian artery origin without stenosis. No dissection or arterial injury. No significant stenosis of the brachiocephalic or subclavian arteries. CERVICAL CAROTID ARTERIES: The right carotid artery is patent from the origin to the skull base. Calcified atherosclerosis at the right carotid bifurcation without hemodynamically significant stenosis. The left carotid artery is patent from the origin to the skull base. Calcified atherosclerosis at the left carotid bifurcation without hemodynamically significant stenosis. No dissection or arterial injury. CERVICAL VERTEBRAL ARTERIES: No dissection, arterial injury, or significant stenosis. LUNGS AND MEDIASTINUM: Abnormal soft tissue adjacent to the bronchovascular bundle in the medial right upper lobe just superior to the right hilum which could reflect a pulmonary nodule/mass versus enlarged lymph nodes recommend correlation with dedicated CT chest. SOFT TISSUES: No acute abnormality. BONES: Degenerative changes in the visualized spine. CTA HEAD: ANTERIOR CIRCULATION: The intracranial internal carotid arteries are patent bilaterally. Atherosclerosis of the bilateral carotid siphons. Mild stenosis of the right supraclinoid ICA. The middle cerebral arteries are patent bilaterally. There is tapering and mild stenosis of the distal M1 segment left MCA. Additional mild stenosis of a proximal M2 branch of the left MCA. The anterior cerebral arteries are patent bilaterally. No aneurysm. POSTERIOR CIRCULATION: The posterior cerebral arteries are patent bilaterally. There is  moderate to severe stenosis of the P2 segment left PCA. The superior cerebellar arteries are patent bilaterally. The basilar artery is patent. The posterior inferior cerebellar arteries are patent bilaterally. No significant stenosis of the vertebral arteries. No aneurysm. OTHER: No dural venous sinus thrombosis on this non-dedicated study. IMPRESSION: 1. No large vessel occlusion. 2. Mild stenosis of the right supraclinoid ICA. 3. Tapering and mild stenosis of the distal M1 segment left MCA and additional mild stenosis of a proximal M2 branch of the left MCA. 4. Moderate to severe stenosis of the P2 segment left PCA. 5. Abnormal soft tissue adjacent to the bronchovascular bundle in the medial right upper lobe just superior to the right hilum, which could reflect a pulmonary nodule/mass versus enlarged lymph nodes. Recommend correlation with dedicated CT chest. Electronically signed by: Donnice Mania MD 11/03/2023 06:32 PM EDT RP Workstation: [id number]   MR BRAIN WO CONTRAST Result Date: 11/03/2023 CLINICAL DATA:  Provided history: Neuro deficit, acute, stroke suspected. EXAM: MRI HEAD WITHOUT CONTRAST TECHNIQUE: Multiplanar, multiecho pulse sequences of the brain and surrounding structures were obtained without intravenous contrast. COMPARISON:  Head CT 11/03/2023. FINDINGS: Brain: No age-advanced or lobar predominant cerebral atrophy. 4 mm focus  of restricted diffusion within the left thalamus consistent with an acute infarct. Chronic lacunar infarct within the left caudate head. Mild-to-moderate ill-defined T2 FLAIR hyperintense signal abnormality within the pons, nonspecific but compatible with chronic small vessel ischemic disease. No cortical encephalomalacia is identified. No significant cerebral white matter disease for age. No evidence of an intracranial mass. No chronic intracranial blood products. No extra-axial fluid collection. No midline shift. Vascular: Maintained flow voids within the proximal  large arterial vessels. Skull and upper cervical spine: No focal worrisome marrow lesion. Sinuses/Orbits: No mass or acute finding within the imaged orbits. No significant paranasal sinus disease. IMPRESSION: 1. 4 mm acute infarct within the left thalamus. 2. Chronic lacunar infarct within the left caudate head. 3. Mild-to-moderate chronic small vessel ischemic changes within the pons. Electronically Signed   By: Rockey Childs D.O.   On: 11/03/2023 16:41   CT Head Wo Contrast Result Date: 11/03/2023 EXAM: CT HEAD WITHOUT CONTRAST 11/03/2023 11:06:00 AM TECHNIQUE: CT of the head was performed without the administration of intravenous contrast. Automated exposure control, iterative reconstruction, and/or weight based adjustment of the mA/kV was utilized to reduce the radiation dose to as low as reasonably achievable. COMPARISON: None available. CLINICAL HISTORY: Neuro deficit, acute, stroke suspected. Pt to ED via EMS with c/o right-sided facial numbness (around the mouth) and right thumb/right index finger numbness that started last night around 1800. FINDINGS: BRAIN AND VENTRICLES: No acute hemorrhage. No evidence of acute infarct. No hydrocephalus. No extra-axial collection. No mass effect or midline shift. There is mild-to-moderate calcific atheromatous disease within the carotid siphons. ORBITS: No acute abnormality. SINUSES: No acute abnormality. SOFT TISSUES AND SKULL: No acute soft tissue abnormality. No skull fracture. IMPRESSION: 1. No acute intracranial abnormality. Electronically signed by: Evalene Coho MD 11/03/2023 11:28 AM EDT RP Workstation: [id number]    Microbiology: Results for orders placed or performed during the hospital encounter of 12/15/17  Surgical pcr screen     Status: None   Collection Time: 12/15/17 11:31 AM   Specimen: Nasal Mucosa; Nasal Swab  Result Value Ref Range Status   MRSA, PCR NEGATIVE NEGATIVE Final   Staphylococcus aureus NEGATIVE NEGATIVE Final    Comment:  (NOTE) The Xpert SA Assay (FDA approved for NASAL specimens in patients 41 years of age and older), is one component of a comprehensive surveillance program. It is not intended to diagnose infection nor to guide or monitor treatment. Performed at [facility], [address] [place], [place] [address]   Urine culture     Status: None   Collection Time: 12/15/17 11:32 AM   Specimen: Urine, Clean Catch  Result Value Ref Range Status   Specimen Description URINE, CLEAN CATCH  Final   Special Requests NONE  Final   Culture   Final    NO GROWTH Performed at [facility], [address], [place], [place] [address]    Report Status 12/16/2017 FINAL  Final    Labs: CBC: Recent Labs  Lab 11/03/23 1101 11/04/23 0136  WBC 7.8 10.5  NEUTROABS 6.0  --   HGB 15.1* 13.9  HCT 43.5 40.2  MCV 88.2 88.0  PLT 256 278   Basic Metabolic Panel: Recent Labs  Lab 11/03/23 1101 11/04/23 0136  NA 137  --   K 3.8  --   CL 101  --   CO2 25  --   GLUCOSE 150*  --   BUN 11  --   CREATININE 0.75 0.62  CALCIUM  10.1  --   MG 1.9  --    Liver Function Tests: No results for input(s): AST, ALT, ALKPHOS, BILITOT, PROT, ALBUMIN in the last 168 hours. CBG: No results for input(s): GLUCAP in the last 168 hours.  Discharge time spent: approximately 45 minutes spent on discharge counseling, evaluation of patient on day of discharge, and coordination of discharge planning with nursing, social work, pharmacy and case management  Signed: Lonni SHAUNNA Dalton, MD [facility] 11/04/2023

## 2023-11-04 NOTE — Care Management Obs Status (Signed)
 MEDICARE OBSERVATION STATUS NOTIFICATION   Patient Details  Name: [name] MRN: [id number] Date of Birth: 1950/11/03   Medicare Observation Status Notification Given:     Obs notice was signed and copy given   Claretta Deed 11/04/2023, 2:36 PM

## 2023-11-04 NOTE — TOC Transition Note (Signed)
 Transition of Care [facility]) - Discharge Note   Patient Details  Name: [name] MRN: [id number] Date of Birth: 02/15/1950  [facility] CM/SW Contact:  Andrez JULIANNA George, RN Phone Number: 11/04/2023, 2:19 PM   Clinical Narrative:    73 y.o. F with obesity, HTN, BrCA and DM presented with acute right sided facial numbness and right finger numbness.   Pt is discharging home with self care.  She lives with her daughter. Daughter works during the daytime.  No DME.  Pt drives self and manages her own medications. No follow up per PT.  Pt has transportation home.   Final next level of care: Home/Self Care Barriers to Discharge: No Barriers Identified   Patient Goals and CMS Choice            Discharge Placement                       Discharge Plan and Services Additional resources added to the After Visit Summary for                                       Social Drivers of Health (SDOH) Interventions SDOH Screenings   Food Insecurity: No Food Insecurity (11/03/2023)  Housing: Low Risk  (11/03/2023)  Transportation Needs: No Transportation Needs (11/03/2023)  Utilities: Not At Risk (11/03/2023)  Financial Resource Strain: Low Risk  (04/18/2023)   Received from [facility]  Physical Activity: Insufficiently Active (04/18/2023)   Received from [facility]  Social Connections: Moderately Integrated (11/03/2023)  Stress: Stress Concern Present (04/18/2023)   Received from [facility]  Tobacco Use: Low Risk  (11/03/2023)     Readmission Risk Interventions     No data to display

## 2023-12-03 NOTE — Progress Notes (Unsigned)
 New Patient Pulmonology Office Visit   Subjective:  Patient ID: [name], female    DOB: [birth year]  MRN: [id number]  Referred by: Jonel Lonni SQUIBB,*  CC: No chief complaint on file.   HPI [name] is a 73 y.o. female with hx of obesity, HTN, Breast cancer and DM who was recently admitted with acute right sided facial numbness and right finger numbness in the setting of left thalamic stroke incidentally noted to have a RUL solitary pulmonary nodule on CTA head and neck.   Symptoms Associated with Lung cancer:   {Central Tumor Sx:33645}  {Peripheral Tumor Sx:33646}  {Sx of Metastasis:33647}  {Conditions associated with lung cancer & imp to identify prior to bronch:33648}  {STOPBANG:33649}  {Hx of Anesthesia reactions:33650}  PMH:   Important Medications:   Allergies:   Social History:  {Smoking and Biomass Fuel Exposure:33651}  {Occupational Exposures:33652}  {Military Specific Exposures:33653}  Family History: {Cancer-related QY:66345}  ASA grade:  {ASA GRADE:110003}  Karnofsky Performance Status: {Karnofsky Performance Status:33655}  ECOG Performance Status: {findings; ecog performance status:31780}   {PULM QUESTIONNAIRES (Optional):33196}  ROS  Allergies: Patient has no known allergies.  Current Outpatient Medications:    ALPRAZolam  (XANAX ) 0.5 MG tablet, Take 0.25 mg by mouth daily as needed for anxiety or sleep., Disp: , Rfl:    aspirin  EC 81 MG tablet, Take 1 tablet (81 mg total) by mouth daily. Swallow whole., Disp: , Rfl:    CALCIUM-MAGNESIUM-ZINC PO, Take 1 tablet by mouth daily., Disp: , Rfl:    cholecalciferol (VITAMIN D3) 25 MCG (1000 UNIT) tablet, Take 1,000 Units by mouth daily., Disp: , Rfl:    clopidogrel (PLAVIX) 75 MG tablet, Take 1 tablet (75 mg total) by mouth daily., Disp: 21 tablet, Rfl: 0   diclofenac Sodium (VOLTAREN) 1 % GEL, Apply 2 g topically 4 (four) times daily., Disp: , Rfl:    famotidine (PEPCID) 40  MG tablet, Take 40 mg by mouth every evening., Disp: , Rfl:    furosemide  (LASIX ) 20 MG tablet, Take 20 mg by mouth daily., Disp: , Rfl:    ibuprofen (ADVIL) 200 MG tablet, Take 400 mg by mouth at bedtime., Disp: , Rfl:    losartan  (COZAAR ) 50 MG tablet, Take 50 mg by mouth daily., Disp: , Rfl:    metFORMIN (GLUCOPHAGE-XR) 500 MG 24 hr tablet, Take 500 mg by mouth daily with breakfast., Disp: , Rfl:    mometasone (ELOCON) 0.1 % cream, Apply 1 Application topically daily as needed (eczema)., Disp: , Rfl:    Multiple Vitamin (MULTIVITAMIN) tablet, Take 1 tablet by mouth daily., Disp: , Rfl:    Omega-3 Fatty Acids (FISH OIL) 1200 MG CAPS, Take 2 capsules (2,400 mg total) by mouth daily., Disp: , Rfl:    pantoprazole  (PROTONIX ) 40 MG tablet, Take 40 mg by mouth daily., Disp: , Rfl:    Probiotic Product (PROBIOTIC FORMULA PO), Take 1 tablet by mouth daily., Disp: , Rfl:    psyllium (REGULOID) 0.52 g capsule, Take 3 capsules by mouth daily., Disp: , Rfl:    rosuvastatin (CRESTOR) 20 MG tablet, Take 1 tablet (20 mg total) by mouth at bedtime., Disp: 90 tablet, Rfl: 0 Past Medical History:  Diagnosis Date   Arthritis    Breast cancer (HCC)    Breast cancer, right breast (HCC) 07/21/2011   GERD (gastroesophageal reflux disease)    Hypertension    Personal history of radiation therapy 2007   Pneumonia 1988   h/o   PONV (postoperative  nausea and vomiting)    Primary localized osteoarthritis of right knee 12/26/2017   Past Surgical History:  Procedure Laterality Date   ABDOMINAL HYSTERECTOMY  1994   ANAL FISSURE REPAIR  1999   APPENDECTOMY  2015   BREAST BIOPSY Right 03/05/2005   BREAST LUMPECTOMY Right 03/25/2005   BREAST REDUCTION SURGERY  2011   KNEE ARTHROSCOPY Right    2013,2018   REDUCTION MAMMAPLASTY     TOTAL KNEE ARTHROPLASTY Right 12/26/2017   Procedure: RIGHT TOTAL KNEE ARTHROPLASTY;  Surgeon: Jane Charleston, MD;  Location: [facility] OR;  Service: Orthopedics;  Laterality: Right;    Family History  Problem Relation Age of Onset   Stroke Mother    Breast cancer Paternal Aunt    Breast cancer Paternal Aunt    Breast cancer Paternal Aunt    Stroke Maternal Grandmother    Social History   Socioeconomic History   Marital status: Widowed    Spouse name: Not on file   Number of children: Not on file   Years of education: Not on file   Highest education level: Not on file  Occupational History   Not on file  Tobacco Use   Smoking status: Never   Smokeless tobacco: Never  Vaping Use   Vaping status: Never Used  Substance and Sexual Activity   Alcohol use: Yes    Comment: rarely   Drug use: No   Sexual activity: Not on file  Other Topics Concern   Not on file  Social History Narrative   Not on file   Social Drivers of Health   Financial Resource Strain: Low Risk  (04/18/2023)   Received from [facility]   Overall Financial Resource Strain (CARDIA)    Difficulty of Paying Living Expenses: Not hard at all  Food Insecurity: No Food Insecurity (11/03/2023)   Hunger Vital Sign    Worried About Running Out of Food in the Last Year: Never true    Ran Out of Food in the Last Year: Never true  Transportation Needs: No Transportation Needs (11/03/2023)   PRAPARE - Administrator, Civil Service (Medical): No    Lack of Transportation (Non-Medical): No  Physical Activity: Insufficiently Active (04/18/2023)   Received from [facility]   Exercise Vital Sign    On average, how many days per week do you engage in moderate to strenuous exercise (like a brisk walk)?: 2 days    On average, how many minutes do you engage in exercise at this level?: 10 min  Stress: Stress Concern Present (04/18/2023)   Received from [facility] of Occupational Health - Occupational Stress Questionnaire    Feeling of Stress : To some extent  Social Connections: Moderately Integrated (11/03/2023)   Social Connection and Isolation Panel    Frequency of  Communication with Friends and Family: Three times a week    Frequency of Social Gatherings with Friends and Family: Three times a week    Attends Religious Services: 1 to 4 times per year    Active Member of Clubs or Organizations: Yes    Attends Banker Meetings: 1 to 4 times per year    Marital Status: Widowed  Intimate Partner Violence: Not At Risk (11/03/2023)   Humiliation, Afraid, Rape, and Kick questionnaire    Fear of Current or Ex-Partner: No    Emotionally Abused: No    Physically Abused: No    Sexually Abused: No  Objective:  There were no vitals taken for this visit. {Pulm Vitals (Optional):32837}  Physical Exam  Diagnostic Review:  {Labs (Optional):32838}  CT Chest 11/04/2023: IMPRESSION: 1. Central right upper lobe 15 mm pulmonary nodule adjacent to the bronchus; recommend dedicated follow-up with PET/CT and/or tissue sampling for further characterization. 2. These results will be called to the ordering clinician or representative by the radiologist assistant, and communication documented in the pacs or clario dashboard    Assessment & Plan:   Assessment & Plan   No orders of the defined types were placed in this encounter.     No follow-ups on file.   Man Bonneau, MD

## 2023-12-03 NOTE — H&P (View-Only) (Signed)
 New Patient Pulmonology Office Visit   Subjective:  Patient ID: [name], female    DOB: [birth year]  MRN: [id number]  Referred by: Jonel Lonni SQUIBB,*  CC: No chief complaint on file.   HPI [name] is a 73 y.o. female with hx of obesity, HTN, Breast cancer and DM who was recently admitted with acute right sided facial numbness and right finger numbness in the setting of left thalamic stroke incidentally noted to have a RUL solitary pulmonary nodule on CTA head and neck.   Symptoms Associated with Lung cancer:   {Central Tumor Sx:33645}  {Peripheral Tumor Sx:33646}  {Sx of Metastasis:33647}  {Conditions associated with lung cancer & imp to identify prior to bronch:33648}  {STOPBANG:33649}  {Hx of Anesthesia reactions:33650}  PMH:   Important Medications:   Allergies:   Social History:  {Smoking and Biomass Fuel Exposure:33651}  {Occupational Exposures:33652}  {Military Specific Exposures:33653}  Family History: {Cancer-related QY:66345}  ASA grade:  {ASA GRADE:110003}  Karnofsky Performance Status: {Karnofsky Performance Status:33655}  ECOG Performance Status: {findings; ecog performance status:31780}   {PULM QUESTIONNAIRES (Optional):33196}  ROS  Allergies: Patient has no known allergies.  Current Outpatient Medications:    ALPRAZolam  (XANAX ) 0.5 MG tablet, Take 0.25 mg by mouth daily as needed for anxiety or sleep., Disp: , Rfl:    aspirin  EC 81 MG tablet, Take 1 tablet (81 mg total) by mouth daily. Swallow whole., Disp: , Rfl:    CALCIUM-MAGNESIUM-ZINC PO, Take 1 tablet by mouth daily., Disp: , Rfl:    cholecalciferol (VITAMIN D3) 25 MCG (1000 UNIT) tablet, Take 1,000 Units by mouth daily., Disp: , Rfl:    clopidogrel (PLAVIX) 75 MG tablet, Take 1 tablet (75 mg total) by mouth daily., Disp: 21 tablet, Rfl: 0   diclofenac Sodium (VOLTAREN) 1 % GEL, Apply 2 g topically 4 (four) times daily., Disp: , Rfl:    famotidine (PEPCID) 40  MG tablet, Take 40 mg by mouth every evening., Disp: , Rfl:    furosemide  (LASIX ) 20 MG tablet, Take 20 mg by mouth daily., Disp: , Rfl:    ibuprofen (ADVIL) 200 MG tablet, Take 400 mg by mouth at bedtime., Disp: , Rfl:    losartan  (COZAAR ) 50 MG tablet, Take 50 mg by mouth daily., Disp: , Rfl:    metFORMIN (GLUCOPHAGE-XR) 500 MG 24 hr tablet, Take 500 mg by mouth daily with breakfast., Disp: , Rfl:    mometasone (ELOCON) 0.1 % cream, Apply 1 Application topically daily as needed (eczema)., Disp: , Rfl:    Multiple Vitamin (MULTIVITAMIN) tablet, Take 1 tablet by mouth daily., Disp: , Rfl:    Omega-3 Fatty Acids (FISH OIL) 1200 MG CAPS, Take 2 capsules (2,400 mg total) by mouth daily., Disp: , Rfl:    pantoprazole  (PROTONIX ) 40 MG tablet, Take 40 mg by mouth daily., Disp: , Rfl:    Probiotic Product (PROBIOTIC FORMULA PO), Take 1 tablet by mouth daily., Disp: , Rfl:    psyllium (REGULOID) 0.52 g capsule, Take 3 capsules by mouth daily., Disp: , Rfl:    rosuvastatin (CRESTOR) 20 MG tablet, Take 1 tablet (20 mg total) by mouth at bedtime., Disp: 90 tablet, Rfl: 0 Past Medical History:  Diagnosis Date   Arthritis    Breast cancer (HCC)    Breast cancer, right breast (HCC) 07/21/2011   GERD (gastroesophageal reflux disease)    Hypertension    Personal history of radiation therapy 2007   Pneumonia 1988   h/o   PONV (postoperative  nausea and vomiting)    Primary localized osteoarthritis of right knee 12/26/2017   Past Surgical History:  Procedure Laterality Date   ABDOMINAL HYSTERECTOMY  1994   ANAL FISSURE REPAIR  1999   APPENDECTOMY  2015   BREAST BIOPSY Right 03/05/2005   BREAST LUMPECTOMY Right 03/25/2005   BREAST REDUCTION SURGERY  2011   KNEE ARTHROSCOPY Right    2013,2018   REDUCTION MAMMAPLASTY     TOTAL KNEE ARTHROPLASTY Right 12/26/2017   Procedure: RIGHT TOTAL KNEE ARTHROPLASTY;  Surgeon: Jane Charleston, MD;  Location: [facility] OR;  Service: Orthopedics;  Laterality: Right;    Family History  Problem Relation Age of Onset   Stroke Mother    Breast cancer Paternal Aunt    Breast cancer Paternal Aunt    Breast cancer Paternal Aunt    Stroke Maternal Grandmother    Social History   Socioeconomic History   Marital status: Widowed    Spouse name: Not on file   Number of children: Not on file   Years of education: Not on file   Highest education level: Not on file  Occupational History   Not on file  Tobacco Use   Smoking status: Never   Smokeless tobacco: Never  Vaping Use   Vaping status: Never Used  Substance and Sexual Activity   Alcohol use: Yes    Comment: rarely   Drug use: No   Sexual activity: Not on file  Other Topics Concern   Not on file  Social History Narrative   Not on file   Social Drivers of Health   Financial Resource Strain: Low Risk  (04/18/2023)   Received from [facility]   Overall Financial Resource Strain (CARDIA)    Difficulty of Paying Living Expenses: Not hard at all  Food Insecurity: No Food Insecurity (11/03/2023)   Hunger Vital Sign    Worried About Running Out of Food in the Last Year: Never true    Ran Out of Food in the Last Year: Never true  Transportation Needs: No Transportation Needs (11/03/2023)   PRAPARE - Administrator, Civil Service (Medical): No    Lack of Transportation (Non-Medical): No  Physical Activity: Insufficiently Active (04/18/2023)   Received from [facility]   Exercise Vital Sign    On average, how many days per week do you engage in moderate to strenuous exercise (like a brisk walk)?: 2 days    On average, how many minutes do you engage in exercise at this level?: 10 min  Stress: Stress Concern Present (04/18/2023)   Received from [facility] of Occupational Health - Occupational Stress Questionnaire    Feeling of Stress : To some extent  Social Connections: Moderately Integrated (11/03/2023)   Social Connection and Isolation Panel    Frequency of  Communication with Friends and Family: Three times a week    Frequency of Social Gatherings with Friends and Family: Three times a week    Attends Religious Services: 1 to 4 times per year    Active Member of Clubs or Organizations: Yes    Attends Banker Meetings: 1 to 4 times per year    Marital Status: Widowed  Intimate Partner Violence: Not At Risk (11/03/2023)   Humiliation, Afraid, Rape, and Kick questionnaire    Fear of Current or Ex-Partner: No    Emotionally Abused: No    Physically Abused: No    Sexually Abused: No  Objective:  There were no vitals taken for this visit. {Pulm Vitals (Optional):32837}  Physical Exam  Diagnostic Review:  {Labs (Optional):32838}  CT Chest 11/04/2023: IMPRESSION: 1. Central right upper lobe 15 mm pulmonary nodule adjacent to the bronchus; recommend dedicated follow-up with PET/CT and/or tissue sampling for further characterization. 2. These results will be called to the ordering clinician or representative by the radiologist assistant, and communication documented in the pacs or clario dashboard    Assessment & Plan:   Assessment & Plan   No orders of the defined types were placed in this encounter.     No follow-ups on file.   Man Bonneau, MD

## 2023-12-05 ENCOUNTER — Telehealth: Payer: Self-pay | Admitting: Pulmonary Disease

## 2023-12-05 ENCOUNTER — Encounter (HOSPITAL_BASED_OUTPATIENT_CLINIC_OR_DEPARTMENT_OTHER): Payer: Self-pay | Admitting: Pulmonary Disease

## 2023-12-05 ENCOUNTER — Encounter: Payer: Self-pay | Admitting: Pulmonary Disease

## 2023-12-05 ENCOUNTER — Ambulatory Visit (INDEPENDENT_AMBULATORY_CARE_PROVIDER_SITE_OTHER): Admitting: Pulmonary Disease

## 2023-12-05 VITALS — BP 142/86 | HR 116 | Ht 66.0 in | Wt 217.9 lb

## 2023-12-05 DIAGNOSIS — R911 Solitary pulmonary nodule: Secondary | ICD-10-CM | POA: Diagnosis not present

## 2023-12-05 DIAGNOSIS — C3411 Malignant neoplasm of upper lobe, right bronchus or lung: Secondary | ICD-10-CM | POA: Insufficient documentation

## 2023-12-05 NOTE — Assessment & Plan Note (Signed)
 Right upper lobe pulmonary nodule, suspicious for malignancy 15 mm nodule in right upper lobe concerning for malignancy, likely adenocarcinoma due to non-smoking status and prior radiation to R chest area in the setting of breast cancer. Bronchus sign suggests biopsy access. - Order PET scan to assess nodule metabolic activity and rule out metastasis. - Schedule robotic bronchoscopy with cone beam CT for biopsy under general anesthesia. - Instruct to stop aspirin  two days before procedure. - Ensure Plavix is stopped for at least five days before procedure. - Order baseline PFTs. - Ensure she has a ride home post-procedure due to sedation. - Plan follow-up appointment one week post-procedure to discuss biopsy results.  History of stroke without residual deficits - 11/03/2023 Recent stroke with no residual deficits. Not eligible for thrombolytic therapy due to delayed presentation. On aspirin  therapy post-stroke. - Follow up with neurology  History of right-sided breast cancer, status post lumpectomy and radiation Right-sided breast cancer treated with lumpectomy and radiation in 2007. Prior radiation increases lung cancer risk, particularly in right upper lobe.

## 2023-12-05 NOTE — Telephone Encounter (Signed)
 Please schedule the following:  Provider performing procedure:Hattar/Dewald Diagnosis: Solitary Pulmonary Nodule Which side if for nodule / mass? Right Upper Lobe Procedure: Navigational bronchoscopy  Has patient been spoken to by Provider and given informed consent? Yes Anesthesia: General Do you need Fluro? Yes Duration of procedure: 1.5 hours Date: 11/10 Alternate Date: 11/17 Time: Any Location: [facility] Does patient have OSA? No DM? No Or Latex allergy? No Medication Restriction/ Anticoagulate/Antiplatelet: Aspirin  81 mg, stop 2 days prior to procedure Pre-op Labs Ordered:determined by Anesthesia Imaging request: PET/CT and Super D CT.  (If, SuperDimension CT Chest, please have STAT courier sent to ENDO)

## 2023-12-05 NOTE — Patient Instructions (Addendum)
  VISIT SUMMARY: Today, we discussed a lung nodule found in your right upper lobe during a recent stroke workup. We reviewed your medical history, including your past breast cancer treatment and recent stroke, and planned further tests and procedures to assess the nodule.  YOUR PLAN: RIGHT UPPER LOBE PULMONARY NODULE: A 15 mm nodule in your right upper lobe is concerning for possible cancer, likely adenocarcinoma, given your non-smoking status and prior radiation treatment. -Order a PET scan to check the nodule's activity and rule out spread to other areas. -Schedule a robotic bronchoscopy with cone beam CT for a biopsy under general anesthesia. -Stop taking aspirin  two days before the procedure. -Ensure Plavix is stopped for at least five days before the procedure. -Arrange a breathing test to see if you are a candidate for surgery to remove part of your lung if needed. -Make sure you have a ride home after the procedure due to sedation. -Schedule a follow-up appointment one week after the procedure to discuss the biopsy results.  HISTORY OF STROKE WITHOUT RESIDUAL DEFICITS: You recently had a stroke but have no lasting symptoms. You are currently on aspirin  therapy. -Continue taking aspirin  as prescribed.  HISTORY OF RIGHT-SIDED BREAST CANCER: You had breast cancer on the right side treated with lumpectomy and radiation in 2007. This increases your risk for lung cancer, especially in the right upper lobe. -No new treatment needed at this time, but this history is important for understanding your current lung nodule.  Contains text generated by [employer].

## 2023-12-05 NOTE — Telephone Encounter (Signed)
 Sending to [facility] to auth. Letter given to pt by Jamie.

## 2023-12-06 ENCOUNTER — Encounter (HOSPITAL_COMMUNITY)
Admission: RE | Admit: 2023-12-06 | Discharge: 2023-12-06 | Disposition: A | Discharge status: Home / Self Care | Source: Ambulatory Visit | Attending: Pulmonary Disease | Admitting: Pulmonary Disease

## 2023-12-06 DIAGNOSIS — R911 Solitary pulmonary nodule: Secondary | ICD-10-CM | POA: Diagnosis present

## 2023-12-06 LAB — GLUCOSE, CAPILLARY: Glucose-Capillary: 99 mg/dL (ref 70–99)

## 2023-12-06 MED ORDER — FLUDEOXYGLUCOSE F - 18 (FDG) INJECTION
10.8600 | Freq: Once | INTRAVENOUS | Status: AC
  Administered 2023-12-06: 10.86 via INTRAVENOUS

## 2023-12-07 ENCOUNTER — Ambulatory Visit: Payer: Self-pay | Admitting: Pulmonary Disease

## 2023-12-07 DIAGNOSIS — R911 Solitary pulmonary nodule: Secondary | ICD-10-CM

## 2023-12-08 ENCOUNTER — Other Ambulatory Visit: Payer: Self-pay

## 2023-12-08 ENCOUNTER — Encounter (HOSPITAL_COMMUNITY): Payer: Self-pay

## 2023-12-08 NOTE — Progress Notes (Signed)
 SDW call  Patient was given pre-op instructions over the phone. Patient verbalized understanding of instructions provided. She denied any SOB, fever, cough or CP     PCP - Aldona Pizza Neurologist: [facility] Cardiologist -  Pulmonary:    PPM/ICD - denies Device Orders - na Rep Notified - na   Chest x-ray - na EKG -  11/04/2023 Stress Test - ECHO - 11/04/2023 Cardiac Cath -  PFT: 12/05/2023  Sleep Study/sleep apnea/CPAP: denies  Type II diabetic. A1C 5.6 on 10/24/2023, does not check her sugars Fasting Blood sugar range: na How often check sugars: na Metformin, hold DOS   Blood Thinner Instructions: Plavix, states last dose was 12/04/2023 Aspirin  Instructions:hold 2 days, last dose 12/09/2023   ERAS Protcol - NPO   Anesthesia review: Yes. HTN, DM, stroke, recent admit  Your procedure is scheduled on Monday December 12, 2023  Report to [facility] Main Entrance A at  0700  A.M., then check in with the Admitting office.  Call this number if you have problems the morning of surgery:  [phone]   If you have any questions prior to your surgery date call [phone]: Open Monday-Friday 8am-4pm If you experience any cold or flu symptoms such as cough, fever, chills, shortness of breath, etc. between now and your scheduled surgery, please notify us  at the above number     Remember:  Do not eat or drink after midnight the night before your surgery  Take these medicines the morning of surgery with A SIP OF WATER:  protonix   As needed: xanax   As of today, STOP taking any Aleve, Naproxen, Ibuprofen, Motrin, Advil, Goody's, BC's, all herbal medications, fish oil, and all vitamins. This includes your Voltaren gel

## 2023-12-09 ENCOUNTER — Other Ambulatory Visit

## 2023-12-09 NOTE — Progress Notes (Signed)
 Anesthesia Chart Review: SAME DAY WORK-UP  Case: [id number] Date/Time: 12/12/23 0930   Procedure: VIDEO BRONCHOSCOPY WITH ENDOBRONCHIAL NAVIGATION (Right)   Anesthesia type: General   Diagnosis: Solitary pulmonary nodule [R91.1]   Pre-op diagnosis: solitary pulmonary nodule   [facility] / [facility]   Surgeons: Zaida Zola SAILOR, MD       DISCUSSION: Patient is a 73 year old female scheduled for the above procedure.  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   [facility] admission 11/03/2023 - 11/04/2023 for right sided facial and right thumb and forefinger numbness since the night before. BP 211/111. No muscle weakness, visual changes, confusion, facial droop, or dysarthria. She had been moving boxes a few days prior with some cervical pain which could explain finger numbness but not the unilateral facial numbness. Head CT was negative for acute intracranial abnormality, but brain MRI showed a 4 mm acute infarct within the left thalamus as well as evidence of chronic lacunar infarct within the left caudate head.  There was mild to moderate chronic small vessel ischemic changes within the pons. CTA head and neck showed no large vessel occlusion, mild stenosis of the right supraclinoid ICA and distal M2 and proximal M2 of left MCA, moderate-severe stenosis of the P2 segment of left PCA, and incidental finding of right hilar region mass. Chest CT was subsequently done on 11/04/2023 and showed a 15 mm central RUL pulmonary nodules adjacent to the bronchus with PET/CT and/or tissue sampling for further characterization recommended. Out-patient pulmonology follow-up arranged. In regards to CVA, neurologist Dr. Eather Popp thought infarct likely  from small vessel disease. TTE on 11/04/2023 showed vigorous LV systolic functio with turbulent flow through LV/LVOT, hyperdynamic LVEF > 75%, no RWMA, normal RV systolic function, LA/RA normal in size, trivial MR/TR, mild AR. LDL 109 with goal of < 70. He recommended statin therapy (rosuvastatin) and DAPT x 3 weeks then ASA 81 mg daily along. No therapies needed given minor symptoms. HTN meds: Losartan  50 mg daily and Lasix  20 mg daily. Two month out-patient neurology follow-up planned. In the interim, she has had PCP Teresa Aldona CROME, NP follow-up on 11/10/2023 and pulmonology follow-oup with Dr. Catherine on 12/05/2023. BP control improved.   Per Progress note by Dr. Catherine, RUL lung nodule is suspicious for malignancy. She had completed her Plavix regimen on 12/04/2023. He advised above procedure with recommendaiton to hold ASA for 2 days prior to procedure. PET scan/Super D chest CT ordered and nodule hypermetabolic.   A1c 5.6% on 10/24/2023 ([facility]).   As above, admitted overnight about 5 weeks ago small CVA with minimal symptoms and incidental finding of suspicious lung lesion. Out-patient lung biopsy planned. She is now completed course of Plavix (last dose 12/04/2023) and remains on ASA 81 mg which is being temporarily held for 2 days prior to surgery. Anesthesia team to evaluate on the day of surgery. Discussed history with anesthesiologist Tilford Drivers, MD.    VS: Ht 5' 6 (1.676 m)   Wt 98.4 kg   BMI 35.02 kg/m  BP Readings from Last 3 Encounters:  12/05/23 (!) 142/86  11/04/23 (!) 152/76  12/28/17 (!) 167/83   Pulse Readings from Last 3 Encounters:  12/05/23 (!) 116  11/04/23 (!) 106  12/28/17 94     PROVIDERS: Teresa Aldona  L, NP is PCP  Rosemarie Rothman, MD is neurologist. Evaluated 11/03/2023 with out-patient follow-up on 01/09/2024 with Whitfield Raisin, NP.   LABS: Most recent lab results in [facility] include: Lab Results  Component Value Date   WBC 10.5 11/04/2023   HGB 13.9  11/04/2023   HCT 40.2 11/04/2023   PLT 278 11/04/2023   GLUCOSE 150 (H) 11/03/2023   CHOL 192 11/04/2023   TRIG 140 11/04/2023   HDL 55 11/04/2023   LDLCALC 109 (H) 11/04/2023   NA 137 11/03/2023   K 3.8 11/03/2023   CL 101 11/03/2023   CREATININE 0.62 11/04/2023   BUN 11 11/03/2023   CO2 25 11/03/2023   TSH 2.657 11/03/2023    IMAGES: PET Scan 12/06/2023: IMPRESSION: 1. Right upper lobe pulmonary nodule measuring 1.4 cm with SUV max 7.2, compatible with malignancy. 2. No hypermetabolic adenopathy or distant metastatic disease. 3. Systemic atherosclerosis, including carotid, aortic, and iliac artery calcifications. 4. Diffuse hepatic steatosis. 5. Small type 1 hiatal hernia. 6. Small supraumbilical fat-containing hernia.   NM PET Super D Chest CT 12/06/2023: IMPRESSION: 1. Central right upper lobe 1.6 x 1.0 cm hypermetabolic nodule suspicious for malignancy, with associated right upper lobe airway plugging posteriorly. 2. Aortic and coronary atherosclerosis. 3. Diffuse hepatic steatosis. 4. Thoracic spondylosis. 5. Mild dextroconvex thoracic scoliosis.  CTA Head/Neck 11/03/2023: MPRESSION: 1. No large vessel occlusion. 2. Mild stenosis of the right supraclinoid ICA. 3. Tapering and mild stenosis of the distal M1 segment left MCA and additional mild stenosis of a proximal M2 branch of the left MCA. 4. Moderate to severe stenosis of the P2 segment left PCA. 5. Abnormal soft tissue adjacent to the bronchovascular bundle in the medial right upper lobe just superior to the right hilum, which could reflect a pulmonary nodule/mass versus enlarged lymph nodes. Recommend correlation with dedicated CT chest.  MRI Brain 11/03/2023: IMPRESSION: 1. 4 mm acute infarct within the left thalamus. 2. Chronic lacunar infarct within the left caudate head. 3. Mild-to-moderate chronic small vessel ischemic changes within the pons.   EKG: 11/03/2023: Sinus tachycardia at 118 bpm Low  voltage, precordial leads   CV: Echo 11/04/2023: IMPRESSIONS   1. Vigorous LV systolic function with turbulent flow through LV/LVOT. Left ventricular ejection fraction, by estimation, is >75%. The left  ventricle has hyperdynamic function. The left ventricle has no regional  wall motion abnormalities. Indeterminate  diastolic filling due to E-A fusion.   2. Right ventricular systolic function is normal. The right ventricular  size is normal.   3. The mitral valve is normal in structure. Trivial mitral valve  regurgitation.   4. The aortic valve is tricuspid. Aortic valve regurgitation is mild.   5. The inferior vena cava is normal in size with greater than 50%  respiratory variability, suggesting right atrial pressure of 3 mmHg.  - No atrial level shunt detected by color-flow Doppler.   Past Medical History:  Diagnosis Date   Arthritis    Breast cancer (HCC)    Breast cancer, right breast (HCC) 07/21/2011   Diabetes mellitus without complication (HCC)    GERD (gastroesophageal reflux disease)    Hypertension    Personal history of radiation therapy 2007   Pneumonia 1988   h/o   PONV (postoperative nausea and vomiting)    Primary localized osteoarthritis of right knee 12/26/2017   [facility]roke [facility])     Past Surgical History:  Procedure Laterality Date   ABDOMINAL HYSTERECTOMY  1994   ANAL FISSURE REPAIR  1999  APPENDECTOMY  2015   BREAST BIOPSY Right 03/05/2005   BREAST LUMPECTOMY Right 03/25/2005   BREAST REDUCTION SURGERY  2011   KNEE ARTHROSCOPY Right    [id number]   REDUCTION MAMMAPLASTY     TOTAL KNEE ARTHROPLASTY Right 12/26/2017   Procedure: RIGHT TOTAL KNEE ARTHROPLASTY;  Surgeon: Jane Charleston, MD;  Location: [facility];  Service: Orthopedics;  Laterality: Right;    MEDICATIONS: No current facility-administered medications for this encounter.    ALPRAZolam  (XANAX ) 0.5 MG tablet   aspirin  EC 81 MG tablet   CALCIUM-MAGNESIUM-ZINC PO   cholecalciferol (VITAMIN D3)  25 MCG (1000 UNIT) tablet   clopidogrel (PLAVIX) 75 MG tablet   diclofenac Sodium (VOLTAREN) 1 % GEL   famotidine (PEPCID) 40 MG tablet   furosemide  (LASIX ) 20 MG tablet   ibuprofen (ADVIL) 200 MG tablet   losartan  (COZAAR ) 50 MG tablet   metFORMIN (GLUCOPHAGE-XR) 500 MG 24 hr tablet   mometasone (ELOCON) 0.1 % cream   Multiple Vitamin (MULTIVITAMIN) tablet   Omega-3 Fatty Acids (FISH OIL) 1200 MG CAPS   pantoprazole  (PROTONIX ) 40 MG tablet   Probiotic Product (PROBIOTIC FORMULA PO)   psyllium (REGULOID) 0.52 g capsule   rosuvastatin (CRESTOR) 20 MG tablet    Isaiah Ruder, PA-C Surgical Short Stay/Anesthesiology [facility] Phone [phone] [facility] Phone [phone] 12/09/2023 12:08 PM

## 2023-12-09 NOTE — Anesthesia Preprocedure Evaluation (Addendum)
 Anesthesia Evaluation  Patient identified by MRN, date of birth, ID band Patient awake    Reviewed: Allergy & Precautions, NPO status , Patient's Chart, lab work & pertinent test results  History of Anesthesia Complications (+) PONV and history of anesthetic complications  Airway Mallampati: III  TM Distance: >3 FB Neck ROM: Full    Dental  (+) Dental Advisory Given   Pulmonary neg shortness of breath, neg sleep apnea, neg COPD, neg recent URI Pulmonary nodule   Pulmonary exam normal breath sounds clear to auscultation       Cardiovascular hypertension (losartan ), Pt. on medications (-) angina (-) Past MI, (-) Cardiac Stents and (-) CABG (-) dysrhythmias  Rhythm:Regular Rate:Normal  HLD  TTE 11/04/2023: IMPRESSIONS    1. Vigorous LV systolic function with turbulent flow through LV/LVOT.SABRA  Left ventricular ejection fraction, by estimation, is >75%. The left  ventricle has hyperdynamic function. The left ventricle has no regional  wall motion abnormalities. Indeterminate  diastolic filling due to E-A fusion.   2. Right ventricular systolic function is normal. The right ventricular  size is normal.   3. The mitral valve is normal in structure. Trivial mitral valve  regurgitation.   4. The aortic valve is tricuspid. Aortic valve regurgitation is mild.   5. The inferior vena cava is normal in size with greater than 50%  respiratory variability, suggesting right atrial pressure of 3 mmHg.     Neuro/Psych neg Seizures CVA (admitted 10/2-04/2023), No Residual Symptoms    GI/Hepatic Neg liver ROS,GERD  Medicated,,  Endo/Other  diabetes, Type 2, Oral Hypoglycemic Agents    Renal/GU negative Renal ROS     Musculoskeletal  (+) Arthritis , Osteoarthritis,    Abdominal  (+) + obese  Peds  Hematology negative hematology ROS (+) Lab Results      Component                Value               Date                      WBC                       10.5                11/04/2023                HGB                      13.9                11/04/2023                HCT                      40.2                11/04/2023                MCV                      88.0                11/04/2023                PLT  278                 11/04/2023              Anesthesia Other Findings Last Plavix: completed 12/04/2023  Reproductive/Obstetrics H/o breast cancer                              Anesthesia Physical Anesthesia Plan  ASA: 4  Anesthesia Plan: General   Post-op Pain Management: Tylenol  PO (pre-op)*   Induction: Intravenous  PONV Risk Score and Plan: 4 or greater and Ondansetron , Propofol  infusion, TIVA and Treatment may vary due to age or medical condition  Airway Management Planned: Oral ETT  Additional Equipment:   Intra-op Plan:   Post-operative Plan: Extubation in OR  Informed Consent: I have reviewed the patients History and Physical, chart, labs and discussed the procedure including the risks, benefits and alternatives for the proposed anesthesia with the patient or authorized representative who has indicated his/her understanding and acceptance.     Dental advisory given  Plan Discussed with: CRNA and Anesthesiologist  Anesthesia Plan Comments: (PAT note written 12/09/2023 by Allison Zelenak, PA-C.  Risks of general anesthesia discussed including, but not limited to, sore throat, hoarse voice, chipped/damaged teeth, injury to vocal cords, nausea and vomiting, allergic reactions, lung infection, heart attack, stroke (increased risk due to recent stroke), and death. All questions answered.   )         Anesthesia Quick Evaluation

## 2023-12-12 ENCOUNTER — Encounter (HOSPITAL_COMMUNITY): Admission: RE | Disposition: A | Discharge status: Home / Self Care | Payer: Self-pay | Source: Home / Self Care

## 2023-12-12 ENCOUNTER — Ambulatory Visit (HOSPITAL_COMMUNITY)

## 2023-12-12 ENCOUNTER — Encounter (HOSPITAL_COMMUNITY): Payer: Self-pay

## 2023-12-12 ENCOUNTER — Ambulatory Visit (HOSPITAL_COMMUNITY): Payer: Self-pay | Admitting: Vascular Surgery

## 2023-12-12 ENCOUNTER — Inpatient Hospital Stay: Payer: Self-pay | Admitting: Adult Health

## 2023-12-12 ENCOUNTER — Ambulatory Visit (HOSPITAL_COMMUNITY): Admission: RE | Admit: 2023-12-12 | Discharge: 2023-12-12 | Disposition: A | Discharge status: Home / Self Care

## 2023-12-12 ENCOUNTER — Encounter (HOSPITAL_COMMUNITY): Admission: RE | Payer: Self-pay | Source: Home / Self Care

## 2023-12-12 ENCOUNTER — Ambulatory Visit (HOSPITAL_COMMUNITY): Admission: RE | Admit: 2023-12-12 | Source: Home / Self Care

## 2023-12-12 DIAGNOSIS — M419 Scoliosis, unspecified: Secondary | ICD-10-CM | POA: Insufficient documentation

## 2023-12-12 DIAGNOSIS — K449 Diaphragmatic hernia without obstruction or gangrene: Secondary | ICD-10-CM | POA: Diagnosis not present

## 2023-12-12 DIAGNOSIS — Z801 Family history of malignant neoplasm of trachea, bronchus and lung: Secondary | ICD-10-CM | POA: Insufficient documentation

## 2023-12-12 DIAGNOSIS — R911 Solitary pulmonary nodule: Secondary | ICD-10-CM | POA: Insufficient documentation

## 2023-12-12 DIAGNOSIS — Z7982 Long term (current) use of aspirin: Secondary | ICD-10-CM | POA: Insufficient documentation

## 2023-12-12 DIAGNOSIS — Z923 Personal history of irradiation: Secondary | ICD-10-CM | POA: Diagnosis not present

## 2023-12-12 DIAGNOSIS — K760 Fatty (change of) liver, not elsewhere classified: Secondary | ICD-10-CM | POA: Insufficient documentation

## 2023-12-12 DIAGNOSIS — I2510 Atherosclerotic heart disease of native coronary artery without angina pectoris: Secondary | ICD-10-CM | POA: Diagnosis not present

## 2023-12-12 DIAGNOSIS — I10 Essential (primary) hypertension: Secondary | ICD-10-CM | POA: Diagnosis not present

## 2023-12-12 DIAGNOSIS — M1990 Unspecified osteoarthritis, unspecified site: Secondary | ICD-10-CM | POA: Insufficient documentation

## 2023-12-12 DIAGNOSIS — I351 Nonrheumatic aortic (valve) insufficiency: Secondary | ICD-10-CM | POA: Insufficient documentation

## 2023-12-12 DIAGNOSIS — I700 Atherosclerosis of aorta: Secondary | ICD-10-CM | POA: Insufficient documentation

## 2023-12-12 DIAGNOSIS — Z8673 Personal history of transient ischemic attack (TIA), and cerebral infarction without residual deficits: Secondary | ICD-10-CM | POA: Insufficient documentation

## 2023-12-12 DIAGNOSIS — Z8701 Personal history of pneumonia (recurrent): Secondary | ICD-10-CM | POA: Insufficient documentation

## 2023-12-12 DIAGNOSIS — Z6835 Body mass index (BMI) 35.0-35.9, adult: Secondary | ICD-10-CM | POA: Insufficient documentation

## 2023-12-12 DIAGNOSIS — M47814 Spondylosis without myelopathy or radiculopathy, thoracic region: Secondary | ICD-10-CM | POA: Diagnosis not present

## 2023-12-12 DIAGNOSIS — E785 Hyperlipidemia, unspecified: Secondary | ICD-10-CM | POA: Insufficient documentation

## 2023-12-12 DIAGNOSIS — K219 Gastro-esophageal reflux disease without esophagitis: Secondary | ICD-10-CM | POA: Diagnosis not present

## 2023-12-12 DIAGNOSIS — Z853 Personal history of malignant neoplasm of breast: Secondary | ICD-10-CM | POA: Diagnosis not present

## 2023-12-12 DIAGNOSIS — Z8249 Family history of ischemic heart disease and other diseases of the circulatory system: Secondary | ICD-10-CM | POA: Insufficient documentation

## 2023-12-12 DIAGNOSIS — Z7984 Long term (current) use of oral hypoglycemic drugs: Secondary | ICD-10-CM | POA: Diagnosis not present

## 2023-12-12 DIAGNOSIS — Z79899 Other long term (current) drug therapy: Secondary | ICD-10-CM | POA: Diagnosis not present

## 2023-12-12 DIAGNOSIS — Z8616 Personal history of COVID-19: Secondary | ICD-10-CM | POA: Insufficient documentation

## 2023-12-12 DIAGNOSIS — E119 Type 2 diabetes mellitus without complications: Secondary | ICD-10-CM | POA: Diagnosis not present

## 2023-12-12 DIAGNOSIS — E669 Obesity, unspecified: Secondary | ICD-10-CM | POA: Insufficient documentation

## 2023-12-12 HISTORY — PX: BRONCHIAL NEEDLE ASPIRATION BIOPSY: SHX5106

## 2023-12-12 HISTORY — DX: Type 2 diabetes mellitus without complications: E11.9

## 2023-12-12 HISTORY — PX: BRONCHIAL BIOPSY: SHX5109

## 2023-12-12 HISTORY — DX: Cerebral infarction, unspecified: I63.9

## 2023-12-12 HISTORY — PX: VIDEO BRONCHOSCOPY WITH ENDOBRONCHIAL ULTRASOUND: SHX6177

## 2023-12-12 HISTORY — PX: VIDEO BRONCHOSCOPY WITH ENDOBRONCHIAL NAVIGATION: SHX6175

## 2023-12-12 LAB — CBC
HCT: 38.2 % (ref 36.0–46.0)
Hemoglobin: 13.3 g/dL (ref 12.0–15.0)
MCH: 31.1 pg (ref 26.0–34.0)
MCHC: 34.8 g/dL (ref 30.0–36.0)
MCV: 89.3 fL (ref 80.0–100.0)
Platelets: 227 K/uL (ref 150–400)
RBC: 4.28 MIL/uL (ref 3.87–5.11)
RDW: 14.2 % (ref 11.5–15.5)
WBC: 6.5 K/uL (ref 4.0–10.5)
nRBC: 0 % (ref 0.0–0.2)

## 2023-12-12 LAB — GLUCOSE, CAPILLARY
Glucose-Capillary: 126 mg/dL — ABNORMAL HIGH (ref 70–99)
Glucose-Capillary: 95 mg/dL (ref 70–99)

## 2023-12-12 LAB — BASIC METABOLIC PANEL WITH GFR
Anion gap: 13 (ref 5–15)
BUN: 9 mg/dL (ref 8–23)
CO2: 25 mmol/L (ref 22–32)
Calcium: 9.4 mg/dL (ref 8.9–10.3)
Chloride: 100 mmol/L (ref 98–111)
Creatinine, Ser: 0.71 mg/dL (ref 0.44–1.00)
GFR, Estimated: >60 mL/min (ref >60)
Glucose, Bld: 124 mg/dL — ABNORMAL HIGH (ref 70–99)
Potassium: 3.8 mmol/L (ref 3.5–5.1)
Sodium: 138 mmol/L (ref 135–145)

## 2023-12-12 SURGERY — VIDEO BRONCHOSCOPY WITH ENDOBRONCHIAL NAVIGATION
Anesthesia: General | Laterality: Right

## 2023-12-12 MED ORDER — ROCURONIUM BROMIDE 10 MG/ML (PF) SYRINGE
PREFILLED_SYRINGE | INTRAVENOUS | Status: DC | PRN
Start: 2023-12-12 — End: 2023-12-12
  Administered 2023-12-12: 60 mg via INTRAVENOUS
  Administered 2023-12-12: 40 mg via INTRAVENOUS

## 2023-12-12 MED ORDER — INSULIN ASPART 100 UNIT/ML IJ SOLN: 0.0000 [IU] | INTRAMUSCULAR | Status: DC | PRN

## 2023-12-12 MED ORDER — AMISULPRIDE (ANTIEMETIC) 5 MG/2ML IV SOLN: 10.0000 mg | Freq: Once | INTRAVENOUS | Status: DC | PRN

## 2023-12-12 MED ORDER — FENTANYL CITRATE (PF) 100 MCG/2ML IJ SOLN: 25.0000 ug | INTRAMUSCULAR | Status: DC | PRN

## 2023-12-12 MED ORDER — SUGAMMADEX SODIUM 200 MG/2ML IV SOLN
INTRAVENOUS | Status: DC | PRN
  Administered 2023-12-12: 196.8 mg via INTRAVENOUS

## 2023-12-12 MED ORDER — CHLORHEXIDINE GLUCONATE 0.12 % MT SOLN
15.0000 mL | Freq: Once | OROMUCOSAL | Status: AC
  Administered 2023-12-12: 15 mL via OROMUCOSAL
  Filled 2023-12-12: qty 15

## 2023-12-12 MED ORDER — SODIUM CHLORIDE (PF) 0.9 % IJ SOLN
INTRAMUSCULAR | Status: DC | PRN
  Administered 2023-12-12: 2 mL

## 2023-12-12 MED ORDER — DEXAMETHASONE SOD PHOSPHATE PF 10 MG/ML IJ SOLN
INTRAMUSCULAR | Status: DC | PRN
  Administered 2023-12-12: 10 mg via INTRAVENOUS

## 2023-12-12 MED ORDER — EPINEPHRINE 1 MG/10ML IV SOSY
PREFILLED_SYRINGE | INTRAVENOUS | Status: AC
  Filled 2023-12-12: qty 10

## 2023-12-12 MED ORDER — ACETAMINOPHEN 500 MG PO TABS
1000.0000 mg | ORAL_TABLET | Freq: Once | ORAL | Status: AC
  Administered 2023-12-12: 1000 mg via ORAL
  Filled 2023-12-12: qty 2

## 2023-12-12 MED ORDER — PROPOFOL 10 MG/ML IV BOLUS
INTRAVENOUS | Status: DC | PRN
  Administered 2023-12-12: 150 mg via INTRAVENOUS
  Administered 2023-12-12: 120 ug/kg/min via INTRAVENOUS

## 2023-12-12 MED ORDER — LABETALOL HCL 5 MG/ML IV SOLN
INTRAVENOUS | Status: DC | PRN
  Administered 2023-12-12 (×2): 5 mg via INTRAVENOUS

## 2023-12-12 MED ORDER — OXYCODONE HCL 5 MG/5ML PO SOLN: 5.0000 mg | Freq: Once | ORAL | Status: DC | PRN

## 2023-12-12 MED ORDER — ONDANSETRON HCL 4 MG/2ML IJ SOLN
INTRAMUSCULAR | Status: DC | PRN
  Administered 2023-12-12: 4 mg via INTRAVENOUS

## 2023-12-12 MED ORDER — LACTATED RINGERS IV SOLN: INTRAVENOUS | Status: DC

## 2023-12-12 MED ORDER — OXYCODONE HCL 5 MG PO TABS: 5.0000 mg | ORAL_TABLET | Freq: Once | ORAL | Status: DC | PRN

## 2023-12-12 MED ORDER — LIDOCAINE 2% (20 MG/ML) 5 ML SYRINGE
INTRAMUSCULAR | Status: DC | PRN
  Administered 2023-12-12: 100 mg via INTRAVENOUS

## 2023-12-12 NOTE — Progress Notes (Signed)
 Pt with some swelling to right upper lip, no visible abrasion. Dr. Peggye notified, came to bedside to assess. No new orders, pt instructed to apply ice or use Tylenol  as needed for any pain.  Pt ok for discharge.  Clotilda Schmitz, RN

## 2023-12-12 NOTE — Anesthesia Procedure Notes (Signed)
 Procedure Name: Intubation Date/Time: 12/12/2023 10:03 AM  Performed by: Harrold Macintosh, CRNAPre-anesthesia Checklist: Patient identified, Emergency Drugs available, Suction available and Patient being monitored Patient Re-evaluated:Patient Re-evaluated prior to induction Oxygen Delivery Method: Circle system utilized Preoxygenation: Pre-oxygenation with 100% oxygen Induction Type: IV induction Ventilation: Mask ventilation without difficulty Laryngoscope Size: Miller and 2 Grade View: Grade II Tube type: Oral Tube size: 8.5 mm Number of attempts: 1 Airway Equipment and Method: Stylet Placement Confirmation: ETT inserted through vocal cords under direct vision, positive ETCO2 and breath sounds checked- equal and bilateral Secured at: 21 cm Tube secured with: Tape Dental Injury: Teeth and Oropharynx as per pre-operative assessment

## 2023-12-12 NOTE — Op Note (Signed)
 Video Bronchoscopy with Endobronchial Ultrasound and Electromagnetic Navigation Procedure Note  Date of Operation: 12/12/2023  Pre-op Diagnosis: Lung nodule  Surgeon: Zola Herter, MD   Anesthesia: General endotracheal anesthesia  Operation: Flexible video fiberoptic bronchoscopy with endobronchial ultrasound, robotic assisted navigation and biopsies.  Estimated Blood Loss: Minimal  Complications: None  Indications and History: [name] is a 73 y.o. female with Right upper lobe nodule.  Recommendation was made to achieve a tissue diagnosis using endobronchial ultrasound and robotic assisted navigational bronchoscopy. The risks, benefits, complications, treatment options and expected outcomes were discussed with the patient.  The possibilities of pneumothorax, pneumonia, reaction to medication, pulmonary aspiration, perforation of a viscus, bleeding, failure to diagnose a condition and creating a complication requiring transfusion or operation were discussed with the patient who freely signed the consent.    Description of Procedure: The patient was seen in the Preoperative Area, was examined and was deemed appropriate to proceed.  The patient was taken to [facility] Endoscopy room 3, identified as [name] and the procedure verified as Flexible Video Fiberoptic Bronchoscopy with robotic assisted navigation and endobronchial ultrasound.  A Time Out was held and the above information confirmed.    Flexible bronchoscopy Airway inspection showed abnormal mucosa in the right upper lobe bronchus approximate to the entrance of the posterior segment. Forceps biopsy performed and sent for pathology. Oozing of blood notes post forceps biopsy and hemostasis was achieved with cold saline and epinephrine . Wang needle used to biopsy under fluoroscopy guidance as well.   Robotic assisted navigation: Prior to the date of the procedure a high-resolution CT scan of the chest was performed. Utilizing ION  software program a virtual tracheobronchial tree was generated to allow the creation of distinct navigation pathways to the patient's parenchymal abnormalities. After being taken to the operating room general anesthesia was initiated and the patient  was orally intubated. The video fiberoptic bronchoscope was introduced via the endotracheal tube and a general inspection was performed which showed normal right and left lung anatomy. Aspiration of the bilateral mainstems was completed to remove any remaining secretions. Robotic catheter inserted into patient's endotracheal tube.  Radial EBUS used to confirm concentric view of nodule  Target #1 right upper lobe nodule: The distinct navigation pathways prepared prior to this procedure were then utilized to navigate to patient's lesion identified on CT scan. The robotic catheter was secured into place and the vision probe was withdrawn.  Lesion location was approximated using fluoroscopy.  Local registration and targeting was performed using [employer] Cios mobile C-arm three-dimensional imaging. Radial EBUS used to confirm lesion and a concentric view was obtained. Under fluoroscopic guidance transbronchial needle brushings, transbronchial needle biopsies, and transbronchial forceps biopsies were performed to be sent for cytology and pathology.  Needle-in-lesion was confirmed using Cios mobile C-arm.  A bronchioalveolar lavage was performed in the right upper lobe and sent for cytology and microbiology.      Endobronchial ultrasound: The robotic scope was then withdrawn and the endobronchial ultrasound was used to identify and characterize the peritracheal, hilar and bronchial lymph nodes. Inspection showed small lymph nodes.   At the end of the procedure a general airway inspection was performed and there was no evidence of active bleeding. The bronchoscope was removed.  The patient tolerated the procedure well. There was no significant blood loss  and there were no obvious complications. A post-procedural chest x-ray is pending.  Samples Target #1: 1. Transbronchial needle brushings from right upper lobe nodule 2.  Transbronchial Wang needle biopsies from right upper lobe nodule  3. Transbronchial cryobiopsies from right upper lobe nodule 4. Bronchoalveolar lavage from right upper lobe nodule  5. Endobronchial biopsies from right upper lobe nodule     Zola Herter, MD 12/12/2023, 11:27 AM [facility]

## 2023-12-12 NOTE — Transfer of Care (Signed)
 Immediate Anesthesia Transfer of Care Note  Patient: [name]  Procedure(s) Performed: VIDEO BRONCHOSCOPY WITH ENDOBRONCHIAL NAVIGATION (Right) BRONCHOSCOPY, WITH BIOPSY BRONCHOSCOPY, WITH NEEDLE ASPIRATION BIOPSY BRONCHOSCOPY, WITH EBUS  Patient Location: Endoscopy Unit  Anesthesia Type:General  Level of Consciousness: awake, alert , and oriented  Airway & Oxygen Therapy: Patient Spontanous Breathing and Patient connected to nasal cannula oxygen  Post-op Assessment: Report given to RN, Post -op Vital signs reviewed and stable, Patient moving all extremities X 4, and Patient able to stick tongue midline  Post vital signs: Reviewed and stable  Last Vitals:  Vitals Value Taken Time  BP 171/85 12/12/23 11:35  Temp 37.2 C 12/12/23 11:35  Pulse 93 12/12/23 11:39  Resp 8 12/12/23 11:39  SpO2 94 % 12/12/23 11:39  Vitals shown include unfiled device data.  Last Pain:  Vitals:   12/12/23 1135  TempSrc: Oral  PainSc: 0-No pain      Patients Stated Pain Goal: 0 (12/12/23 1135)  Complications: No notable events documented.

## 2023-12-12 NOTE — Interval H&P Note (Signed)
 History and Physical Interval Note:  12/12/2023 9:32 AM  [name]  has presented today for surgery, with the diagnosis of solitary pulmonary nodule.  The various methods of treatment have been discussed with the patient and family. After consideration of risks, benefits and other options for treatment, the patient has consented to  Procedure(s): VIDEO BRONCHOSCOPY WITH ENDOBRONCHIAL NAVIGATION (Right) as a surgical intervention.  The patient's history has been reviewed, patient examined, no change in status, stable for surgery.  I have reviewed the patient's chart and labs.  Questions were answered to the patient's satisfaction.     [name]

## 2023-12-12 NOTE — Anesthesia Postprocedure Evaluation (Signed)
 Anesthesia Post Note  Patient: [name]  Procedure(s) Performed: VIDEO BRONCHOSCOPY WITH ENDOBRONCHIAL NAVIGATION (Right) BRONCHOSCOPY, WITH BIOPSY BRONCHOSCOPY, WITH NEEDLE ASPIRATION BIOPSY BRONCHOSCOPY, WITH EBUS     Patient location during evaluation: PACU Anesthesia Type: General Level of consciousness: awake Pain management: pain level controlled Vital Signs Assessment: post-procedure vital signs reviewed and stable Respiratory status: spontaneous breathing, nonlabored ventilation and respiratory function stable Cardiovascular status: blood pressure returned to baseline and stable Postop Assessment: no apparent nausea or vomiting Anesthetic complications: no   No notable events documented.  Last Vitals:  Vitals:   12/12/23 1200 12/12/23 1210  BP: (!) 157/75 (!) 147/83  Pulse: 89 91  Resp: 11 (!) 22  Temp:    SpO2: 97% 97%    Last Pain:  Vitals:   12/12/23 1210  TempSrc:   PainSc: 0-No pain                 Delon Aisha Arch

## 2023-12-13 LAB — CYTOLOGY - NON PAP

## 2023-12-14 ENCOUNTER — Encounter (HOSPITAL_COMMUNITY): Payer: Self-pay

## 2023-12-15 ENCOUNTER — Telehealth: Payer: Self-pay

## 2023-12-15 ENCOUNTER — Other Ambulatory Visit: Payer: Self-pay

## 2023-12-15 DIAGNOSIS — C3411 Malignant neoplasm of upper lobe, right bronchus or lung: Secondary | ICD-10-CM

## 2023-12-15 NOTE — Progress Notes (Signed)
 The proposed treatment discussed in conference is for discussion purpose only and is not a binding recommendation.  The patients have not been physically examined, or presented with their treatment options.  Therefore, final treatment plans cannot be decided.

## 2023-12-15 NOTE — Telephone Encounter (Signed)
 I'm going to see her in clinic soon and I'll let her know and will refer if she agrees to that. Thank you.

## 2023-12-15 NOTE — Telephone Encounter (Signed)
 Called patient to inform her of result of tumor board discussion. Will refer to thoracic surgery for consideration of RUL lobectomy.

## 2023-12-15 NOTE — Telephone Encounter (Signed)
 Thoracic Tumor Board Review  [name] was reviewed before presentation on the on the Thoracic Tumor Board on 12/15/2023. She has a personal history of a right upper lobe pulmonary nodule, suspicious for malignancy.  [name] meets [employer] (NCCN) criteria for genetic testing for Hereditary Breast and Ovarian Cancer Syndrome based on her personal history of breast cancer at age 73 and family history of breast cancer in 3 maternal aunts.  She was referred to genetics in 2020 and declined the appointment at that time. It may be appropriate to reoffer the option of meeting with a dentist.   Alison Fryer, MS, CGC  Certified Genetic Counselor  Email: [email or web address]  Phone: [phone]

## 2023-12-21 NOTE — Progress Notes (Signed)
 Established Patient Pulmonology Office Visit   Subjective:  Patient ID: [name], female    DOB: [birth year]  MRN: [id number]  CC: No chief complaint on file.   HPI  [name] is a 73 y.o. female with hx of obesity, HTN, Breast cancer and DM who was recently admitted with acute right sided facial numbness and right finger numbness in the setting of left thalamic stroke incidentally noted to have a RUL solitary pulmonary nodule on CTA head and neck.   The patient was seen on 12/05/2023, at the time recommended navigational bronchoscopy. This was completed with Dr. Zaida on 12/12/23. Pathology notable for atypical cells. Discussed in tumor board and recommended cardiothoracic surgery for consideration of right upper lobectomy.  {PULM QUESTIONNAIRES (Optional):33196}  ROS  {History (Optional):23778}  Current Outpatient Medications:    ALPRAZolam  (XANAX ) 0.5 MG tablet, Take 0.25 mg by mouth daily as needed for anxiety or sleep., Disp: , Rfl:    aspirin  EC 81 MG tablet, Take 1 tablet (81 mg total) by mouth daily. Swallow whole., Disp: , Rfl:    CALCIUM-MAGNESIUM-ZINC PO, Take 1 tablet by mouth daily., Disp: , Rfl:    cholecalciferol (VITAMIN D3) 25 MCG (1000 UNIT) tablet, Take 1,000 Units by mouth daily., Disp: , Rfl:    clopidogrel (PLAVIX) 75 MG tablet, Take 1 tablet (75 mg total) by mouth daily. (Patient not taking: No sig reported), Disp: 21 tablet, Rfl: 0   diclofenac Sodium (VOLTAREN) 1 % GEL, Apply 2 g topically as needed (pain)., Disp: , Rfl:    famotidine (PEPCID) 40 MG tablet, Take 40 mg by mouth every evening., Disp: , Rfl:    furosemide  (LASIX ) 20 MG tablet, Take 20 mg by mouth daily., Disp: , Rfl:    ibuprofen (ADVIL) 200 MG tablet, Take 400 mg by mouth at bedtime., Disp: , Rfl:    losartan  (COZAAR ) 50 MG tablet, Take 50 mg by mouth daily., Disp: , Rfl:    metFORMIN (GLUCOPHAGE-XR) 500 MG 24 hr tablet, Take 500 mg by mouth daily with breakfast., Disp: , Rfl:     mometasone (ELOCON) 0.1 % cream, Apply 1 Application topically daily as needed (eczema)., Disp: , Rfl:    Multiple Vitamin (MULTIVITAMIN) tablet, Take 1 tablet by mouth daily., Disp: , Rfl:    Omega-3 Fatty Acids (FISH OIL) 1200 MG CAPS, Take 2 capsules (2,400 mg total) by mouth daily., Disp: , Rfl:    pantoprazole  (PROTONIX ) 40 MG tablet, Take 40 mg by mouth daily., Disp: , Rfl:    Probiotic Product (PROBIOTIC FORMULA PO), Take 1 tablet by mouth daily., Disp: , Rfl:    psyllium (REGULOID) 0.52 g capsule, Take 3 capsules by mouth daily., Disp: , Rfl:    rosuvastatin (CRESTOR) 20 MG tablet, Take 1 tablet (20 mg total) by mouth at bedtime., Disp: 90 tablet, Rfl: 0      Objective:  There were no vitals taken for this visit. {Pulm Vitals (Optional):32837}  Physical Exam   Diagnostic Review:  {Labs (Optional):32838}   CT Chest 11/04/2023: IMPRESSION: 1. Central right upper lobe 15 mm pulmonary nodule adjacent to the bronchus; recommend dedicated follow-up with PET/CT and/or tissue sampling for further characterization. 2. These results will be called to the ordering clinician or representative by the radiologist assistant, and communication documented in the pacs or clario Dashboard  PET/CT 12/06/2023: IMPRESSION: 1. Right upper lobe pulmonary nodule measuring 1.4 cm with SUV max 7.2, compatible with malignancy. 2. No hypermetabolic adenopathy or distant metastatic  disease. 3. Systemic atherosclerosis, including carotid, aortic, and iliac artery calcifications. 4. Diffuse hepatic steatosis. 5. Small type 1 hiatal hernia. 6. Small supraumbilical fat-containing hernia.  Pathology results: FINAL MICROSCOPIC DIAGNOSIS:  A. LUNG, RUL, FINE NEEDLE ASPIRATION:  - No malignant cells identified   B. LUNG, RUL, BRUSHING:  - No malignant cells identified   SPECIMEN ADEQUACY:  A. Satisfactory for Evaluation  B. Satisfactory for Evaluation   IMMEDIATE EVALUATION:  REACTIVE BRONCHIAL  CELLS  A FEW ATYPICAL CELLS     (JDP)   MRI Brain 11/03/23: IMPRESSION: 1. 4 mm acute infarct within the left thalamus. 2. Chronic lacunar infarct within the left caudate head. 3. Mild-to-moderate chronic small vessel ischemic changes within the pons.     Assessment & Plan:   Assessment & Plan   No orders of the defined types were placed in this encounter.     No follow-ups on file.   Marcayla Budge, MD

## 2023-12-22 ENCOUNTER — Ambulatory Visit (INDEPENDENT_AMBULATORY_CARE_PROVIDER_SITE_OTHER)

## 2023-12-22 ENCOUNTER — Encounter (HOSPITAL_BASED_OUTPATIENT_CLINIC_OR_DEPARTMENT_OTHER): Payer: Self-pay | Admitting: Pulmonary Disease

## 2023-12-22 ENCOUNTER — Ambulatory Visit (INDEPENDENT_AMBULATORY_CARE_PROVIDER_SITE_OTHER): Admitting: Pulmonary Disease

## 2023-12-22 VITALS — BP 164/76 | HR 108 | Ht 66.0 in | Wt 216.0 lb

## 2023-12-22 DIAGNOSIS — C3491 Malignant neoplasm of unspecified part of right bronchus or lung: Secondary | ICD-10-CM | POA: Diagnosis not present

## 2023-12-22 DIAGNOSIS — R911 Solitary pulmonary nodule: Secondary | ICD-10-CM

## 2023-12-22 LAB — PULMONARY FUNCTION TEST
DL/VA % pred: 95 %
DL/VA: 3.9 ml/min/mmHg/L
DLCO cor % pred: 89 %
DLCO cor: 18.47 ml/min/mmHg
DLCO unc % pred: 90 %
DLCO unc: 18.75 ml/min/mmHg
FEF 25-75 Pre: 3 L/s
FEF2575-%Pred-Pre: 156 %
FEV1-%Pred-Pre: 112 %
FEV1-Pre: 2.69 L
FEV1FVC-%Pred-Pre: 109 %
FEV6-%Pred-Pre: 107 %
FEV6-Pre: 3.26 L
FEV6FVC-%Pred-Pre: 104 %
FVC-%Pred-Pre: 102 %
FVC-Pre: 3.26 L
Pre FEV1/FVC ratio: 83 %
Pre FEV6/FVC Ratio: 100 %
RV % pred: 106 %
RV: 2.48 L
TLC % pred: 106 %
TLC: 5.71 L

## 2023-12-22 NOTE — Patient Instructions (Signed)
 Full PFT w/o post bronchodilator performed today.

## 2023-12-22 NOTE — Progress Notes (Signed)
 Full PFT w/o post bronchodilator performed today.

## 2023-12-22 NOTE — Patient Instructions (Addendum)
 VISIT SUMMARY: You came in for a follow-up after your bronchoscopy and evaluation of a right upper lung nodule. The PET scan confirmed a right upper lobe lung cancer at clinical stage I, and we discussed plans for surgical resection.  YOUR PLAN: RIGHT UPPER LOBE LUNG CANCER: You have a right upper lobe lung cancer at clinical stage I, confirmed by PET scan. The biopsy was attempted but complicated by bleeding. -You have a scheduled follow-up with the thoracic surgeon on December 2nd for consideration of resection. -You are referred to Dr. Deatrice for oncology follow-up and scan surveillance. -Plan for post-operative surveillance scans every 3-6 months for the first two years, then annually if clear. - I'll see you again in 6 months  Contains text generated by [employer].

## 2023-12-23 NOTE — Telephone Encounter (Signed)
 Please advise on where referral needs to be sent

## 2023-12-26 NOTE — Addendum Note (Signed)
 Addended by: Jearlene Bridwell on: 12/26/2023 10:32 AM   Modules accepted: Orders

## 2023-12-27 ENCOUNTER — Ambulatory Visit: Admitting: Acute Care

## 2024-01-03 ENCOUNTER — Ambulatory Visit
Attending: Thoracic Surgery (Cardiothoracic Vascular Surgery) | Admitting: Thoracic Surgery (Cardiothoracic Vascular Surgery)

## 2024-01-03 ENCOUNTER — Encounter: Payer: Self-pay | Admitting: *Deleted

## 2024-01-03 ENCOUNTER — Other Ambulatory Visit: Payer: Self-pay | Admitting: *Deleted

## 2024-01-03 VITALS — BP 200/85 | HR 104 | Resp 20 | Ht 66.0 in | Wt 215.0 lb

## 2024-01-03 DIAGNOSIS — R911 Solitary pulmonary nodule: Secondary | ICD-10-CM

## 2024-01-03 DIAGNOSIS — Z5181 Encounter for therapeutic drug level monitoring: Secondary | ICD-10-CM

## 2024-01-03 NOTE — Progress Notes (Signed)
 PCP is Teresa Aldona CROME, NP Referring Provider is Catherine Cools, MD  Chief Complaint  Patient presents with   Lung Lesion    Surgical consult/ Chest CT 11/04/23/ PET Scan 12/06/23/ Bronch 12/12/23/ PFT's 12/22/23    HPI: Ms. [name] is sent for consultation regarding a right upper lobe lung nodule.  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  She presented on 11/03/2023 with right sided facial numbness and right finger numbness.  MR showed a subcentimeter left thalamic stroke.  She had a workup for that it was noted to have a right upper lobe lung nodule.  CT of the chest showed a 1.4 cm nodule centrally in the right upper lobe.  She underwent a robotic bronchoscopy.  Sampling was limited by bleeding.  Cytology noted a few atypical cells along with reactive bronchial cells.  No definite malignancy.  She is a lifelong non-smoker.  She denies chest pain, pressure, tightness, shortness of breath, cough, and wheezing.  No change in appetite or weight loss.  Overall feels well.  Zubrod Score: At the time of surgery this patient's most appropriate activity status/level should be described as: [x]     0    Normal activity, no symptoms []     1    Restricted in physical strenuous activity but ambulatory, able to do out light work []     2    Ambulatory and capable of self care, unable to do work activities, up and about >50 % of waking hours                              []     3    Only limited self care, in bed greater than 50% of waking hours []     4    Completely disabled, no self care, confined to bed or chair []     5    Moribund   Past Medical History:  Diagnosis Date   Arthritis    Breast cancer (HCC)    Breast cancer, right breast (HCC) 07/21/2011   Diabetes mellitus without complication (HCC)    GERD (gastroesophageal reflux disease)    Hypertension    Personal  history of radiation therapy 2007   Pneumonia 1988   h/o   PONV (postoperative nausea and vomiting)    Primary localized osteoarthritis of right knee 12/26/2017   Stroke [facility])     Past Surgical History:  Procedure Laterality Date   ABDOMINAL HYSTERECTOMY  1994   ANAL FISSURE REPAIR  1999   APPENDECTOMY  2015   BREAST BIOPSY Right 03/05/2005   BREAST LUMPECTOMY Right 03/25/2005   BREAST REDUCTION SURGERY  2011   BRONCHIAL BIOPSY  12/12/2023   Procedure: BRONCHOSCOPY, WITH BIOPSY;  Surgeon: Zaida Zola SAILOR, MD;  Location: [facility] ENDOSCOPY;  Service: Pulmonary;;   BRONCHIAL NEEDLE ASPIRATION BIOPSY  12/12/2023   Procedure: BRONCHOSCOPY, WITH NEEDLE ASPIRATION BIOPSY;  Surgeon: Zaida Zola SAILOR, MD;  Location: [facility] ENDOSCOPY;  Service: Pulmonary;;   KNEE ARTHROSCOPY Right    2013,2018   REDUCTION MAMMAPLASTY     TOTAL KNEE ARTHROPLASTY Right 12/26/2017   Procedure: RIGHT TOTAL KNEE ARTHROPLASTY;  Surgeon: Jane Charleston, MD;  Location: [facility] OR;  Service: Orthopedics;  Laterality: Right;   VIDEO BRONCHOSCOPY WITH ENDOBRONCHIAL NAVIGATION Right 12/12/2023   Procedure: VIDEO BRONCHOSCOPY WITH ENDOBRONCHIAL NAVIGATION;  Surgeon: Zaida Zola  N, MD;  Location: [facility] ENDOSCOPY;  Service: Pulmonary;  Laterality: Right;   VIDEO BRONCHOSCOPY WITH ENDOBRONCHIAL ULTRASOUND  12/12/2023   Procedure: BRONCHOSCOPY, WITH EBUS;  Surgeon: Zaida Zola SAILOR, MD;  Location: [facility] ENDOSCOPY;  Service: Pulmonary;;    Family History  Problem Relation Age of Onset   Stroke Mother    Congestive Heart Failure Father    Stroke Maternal Grandmother    Breast cancer Paternal Aunt    Breast cancer Paternal Aunt    Breast cancer Paternal Aunt     Social History Social History   Tobacco Use   Smoking status: Never   Smokeless tobacco: Never  Vaping Use   Vaping status: Never Used  Substance Use Topics   Alcohol use: Not Currently    Comment: rarely   Drug use: No    Current Outpatient Medications  Medication Sig  Dispense Refill   ALPRAZolam  (XANAX ) 0.5 MG tablet Take 0.25 mg by mouth daily as needed for anxiety or sleep.     aspirin  EC 81 MG tablet Take 1 tablet (81 mg total) by mouth daily. Swallow whole.     CALCIUM -MAGNESIUM-ZINC PO Take 1 tablet by mouth daily.     cholecalciferol (VITAMIN D3) 25 MCG (1000 UNIT) tablet Take 1,000 Units by mouth daily.     diclofenac  Sodium (VOLTAREN ) 1 % GEL Apply 2 g topically as needed (pain).     famotidine  (PEPCID ) 40 MG tablet Take 40 mg by mouth every evening.     furosemide  (LASIX ) 20 MG tablet Take 20 mg by mouth daily.     ibuprofen (ADVIL) 200 MG tablet Take 400 mg by mouth at bedtime.     losartan  (COZAAR ) 50 MG tablet Take 50 mg by mouth daily.     metFORMIN  (GLUCOPHAGE -XR) 500 MG 24 hr tablet Take 500 mg by mouth daily with breakfast.     mometasone (ELOCON) 0.1 % cream Apply 1 Application topically daily as needed (eczema).     Multiple Vitamin (MULTIVITAMIN) tablet Take 1 tablet by mouth daily.     Omega-3 Fatty Acids (FISH OIL ) 1200 MG CAPS Take 2 capsules (2,400 mg total) by mouth daily.     pantoprazole  (PROTONIX ) 40 MG tablet Take 40 mg by mouth daily.     Probiotic Product (PROBIOTIC FORMULA PO) Take 1 tablet by mouth daily.     psyllium (REGULOID) 0.52 g capsule Take 3 capsules by mouth daily.     rosuvastatin  (CRESTOR ) 20 MG tablet Take 1 tablet (20 mg total) by mouth at bedtime. 90 tablet 0   No current facility-administered medications for this visit.    Not on File  Review of Systems  Constitutional:  Negative for activity change, appetite change and unexpected weight change.  HENT:  Negative for trouble swallowing and voice change.   Eyes:  Negative for visual disturbance.  Respiratory:  Negative for cough and shortness of breath.   Cardiovascular:  Positive for leg swelling (left foot). Negative for chest pain.  Gastrointestinal:  Positive for abdominal pain (reflux).  Musculoskeletal:  Positive for arthralgias and joint  swelling.  Neurological:  Negative for seizures, syncope and weakness.       Recent mini stroke  Psychiatric/Behavioral:  The patient is nervous/anxious.   All other systems reviewed and are negative.   BP (!) 200/85   Pulse (!) 104   Resp 20   Ht 5' 6 (1.676 m)   Wt 215 lb (97.5 kg)   SpO2 97% Comment: RA  BMI  34.70 kg/m  Physical Exam Vitals reviewed.  Constitutional:      General: She is not in acute distress.    Appearance: She is obese.  HENT:     Head: Normocephalic and atraumatic.  Eyes:     General: No scleral icterus.    Extraocular Movements: Extraocular movements intact.  Neck:     Vascular: No carotid bruit.  Cardiovascular:     Rate and Rhythm: Normal rate and regular rhythm.     Heart sounds: Murmur (2/6 systolic) heard.  Pulmonary:     Effort: Pulmonary effort is normal. No respiratory distress.     Breath sounds: Normal breath sounds. No wheezing or rales.  Abdominal:     General: There is no distension.     Palpations: Abdomen is soft.  Musculoskeletal:     Cervical back: Neck supple.  Lymphadenopathy:     Cervical: No cervical adenopathy.  Skin:    General: Skin is warm and dry.  Neurological:     General: No focal deficit present.     Mental Status: She is alert and oriented to person, place, and time.     Cranial Nerves: No cranial nerve deficit.     Motor: No weakness.     Diagnostic Tests: CT CHEST WITH CONTRAST 11/04/2023 09:06:47 AM   TECHNIQUE: CT of the chest was performed with the administration of 75 mL of iohexol  (OMNIPAQUE ) 350 MG/ML injection. Multiplanar reformatted images are provided for review. Automated exposure control, iterative reconstruction, and/or weight based adjustment of the mA/kV was utilized to reduce the radiation dose to as low as reasonably achievable.   COMPARISON: None available.   CLINICAL HISTORY: Abnormal xray - lung nodule, >= 1 cm.   FINDINGS:   MEDIASTINUM: Heart and pericardium are  unremarkable. The central airways are clear.   LYMPH NODES: No mediastinal, hilar or axillary lymphadenopathy.   LUNGS AND PLEURA: Central right upper lobe nodule adjacent to the bronchus measures 15 mm on image 45 series 5. No pulmonary edema. No pleural effusion or pneumothorax.   SOFT TISSUES/BONES: Degenerative spurring of the spine. No acute abnormality of the soft tissues.   UPPER ABDOMEN: Limited images of the upper abdomen demonstrates no acute abnormality.   IMPRESSION: 1. Central right upper lobe 15 mm pulmonary nodule adjacent to the bronchus; recommend dedicated follow-up with PET/CT and/or tissue sampling for further characterization. 2. These results will be called to the ordering clinician or representative by the radiologist assistant, and communication documented in the pacs or clario dashboard   Electronically signed by: Norleen Boxer MD 11/04/2023 09:43 AM EDT RP Workstation: [id number]  PET AND CT SKULL BASE TO MID THIGH 12/06/2023 05:14:49 PM   TECHNIQUE:   RADIOPHARMACEUTICAL: 10.86 mCi F-18 FDG Uptake time 60 minutes. Glucose level  mg/dl. Blood pool SUV 3.1 cm.   PET imaging was acquired from the base of the skull to the mid thighs. Non-contrast enhanced computed tomography was obtained for attenuation correction and anatomic localization.   COMPARISON: 11/04/2023   CLINICAL HISTORY: Lung nodule, > 8mm.   FINDINGS:   HEAD AND NECK: No metabolically active cervical lymphadenopathy. Bilateral, carotid atheromatous vascular calcification.   CHEST: The right upper lobe nodule measuring approximately 1.4 cm in diameter on image 63 series 7 has maximum SUV of 7.2, compatible with malignancy. Chest atheroma. Benign appearing calcifications along the right hemidiaphragm. No hypermetabolic adenopathy or findings of distant metastatic spread.   ABDOMEN AND PELVIS: No metabolically active intraperitoneal mass. No metabolically  active  lymphadenopathy. Physiologic activity within the gastrointestinal and genitourinary systems. Diffuse hepatic steatosis. Small type 1 hiatal hernia. Systemic atherosclerosis is present, including the aorta and iliac arteries. Small supraumbilical hernia contains adipose tissue.   BONES AND SOFT TISSUE: No abnormal FDG activity localizes to the bones. No metabolically active aggressive osseous lesion.   IMPRESSION: 1. Right upper lobe pulmonary nodule measuring 1.4 cm with SUV max 7.2, compatible with malignancy. 2. No hypermetabolic adenopathy or distant metastatic disease. 3. Systemic atherosclerosis, including carotid, aortic, and iliac artery calcifications. 4. Diffuse hepatic steatosis. 5. Small type 1 hiatal hernia. 6. Small supraumbilical fat-containing hernia.   Electronically signed by: Ryan Salvage MD 12/06/2023 05:27 PM EST RP Workstation: [id number]   I personally reviewed the CT and PET/CT images.  There is a 1.4 cm central right upper lobe nodule with an SUV max of 7.2.  No evidence of regional or distant metastatic disease.  Atherosclerotic cardiovascular disease.  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  Right upper lobe lung nodule-highly suspicious for a primary bronchogenic carcinoma.  Granulomatous disease and carcinoid tumors are also in the differential.  Degree of hypermetabolic activity is suggestive of malignancy.  We discussed options including radiographic monitoring, repeat bronchoscopy, and surgical resection.  Radiographic monitoring would be inappropriate in the setting.  It is possible, but in my opinion unlikely, that we would get an answer with bronchoscopy that would eliminate  the need for resection.  The most definitive option would be surgical resection for both diagnosis and definitive treatment.  I informed the general nature of the the procedure.  The plan would be to do a robotic assisted right upper lobectomy and lymph node dissection.  She understands the general nature of the procedure including the need for general anesthesia, the incision to be used, the use of the surgical robot, the possible need for conversion to an open procedure, the use of a drainage tube postoperatively, expected hospital stay, and the overall recovery.  I informed her of the indications, risk, benefits, and alternatives.  She understands the risks include, but are not limited to death, MI, DVT, PE, bleeding, possible need for transfusion, infection, prolonged air leak, cardiac arrhythmias, stroke or TIA, as well as the possibility of other unforeseeable complications.  She did have this small thalamic stroke about 2 months ago.  Will check with Dr. Rosemarie to see if we are okay to proceed with surgical resection.  If so the first available date is Wednesday, 01/18/2024.  She has an appointment with his APP on Monday.  Plan: Follow-up with neurology Tentatively plan for robotic assisted right upper lobectomy on 01/18/2024  I spent over 45 minutes in review of records, images, and consultation with [name] today. [name], MD [facility] [phone]

## 2024-01-03 NOTE — H&P (View-Only) (Signed)
 PCP is Teresa Aldona CROME, NP Referring Provider is Catherine Cools, MD  Chief Complaint  Patient presents with   Lung Lesion    Surgical consult/ Chest CT 11/04/23/ PET Scan 12/06/23/ Bronch 12/12/23/ PFT's 12/22/23    HPI: Ms. [name] is sent for consultation regarding a right upper lobe lung nodule.  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  She presented on 11/03/2023 with right sided facial numbness and right finger numbness.  MR showed a subcentimeter left thalamic stroke.  She had a workup for that it was noted to have a right upper lobe lung nodule.  CT of the chest showed a 1.4 cm nodule centrally in the right upper lobe.  She underwent a robotic bronchoscopy.  Sampling was limited by bleeding.  Cytology noted a few atypical cells along with reactive bronchial cells.  No definite malignancy.  She is a lifelong non-smoker.  She denies chest pain, pressure, tightness, shortness of breath, cough, and wheezing.  No change in appetite or weight loss.  Overall feels well.  Zubrod Score: At the time of surgery this patient's most appropriate activity status/level should be described as: [x]     0    Normal activity, no symptoms []     1    Restricted in physical strenuous activity but ambulatory, able to do out light work []     2    Ambulatory and capable of self care, unable to do work activities, up and about >50 % of waking hours                              []     3    Only limited self care, in bed greater than 50% of waking hours []     4    Completely disabled, no self care, confined to bed or chair []     5    Moribund   Past Medical History:  Diagnosis Date   Arthritis    Breast cancer (HCC)    Breast cancer, right breast (HCC) 07/21/2011   Diabetes mellitus without complication (HCC)    GERD (gastroesophageal reflux disease)    Hypertension    Personal  history of radiation therapy 2007   Pneumonia 1988   h/o   PONV (postoperative nausea and vomiting)    Primary localized osteoarthritis of right knee 12/26/2017   Stroke [facility])     Past Surgical History:  Procedure Laterality Date   ABDOMINAL HYSTERECTOMY  1994   ANAL FISSURE REPAIR  1999   APPENDECTOMY  2015   BREAST BIOPSY Right 03/05/2005   BREAST LUMPECTOMY Right 03/25/2005   BREAST REDUCTION SURGERY  2011   BRONCHIAL BIOPSY  12/12/2023   Procedure: BRONCHOSCOPY, WITH BIOPSY;  Surgeon: Zaida Zola SAILOR, MD;  Location: [facility] ENDOSCOPY;  Service: Pulmonary;;   BRONCHIAL NEEDLE ASPIRATION BIOPSY  12/12/2023   Procedure: BRONCHOSCOPY, WITH NEEDLE ASPIRATION BIOPSY;  Surgeon: Zaida Zola SAILOR, MD;  Location: [facility] ENDOS[facility];  Service: Pulmonary;;   KNEE ARTHROSCOPY Right    2013,2018   REDUCTION MAMMAPLASTY     TOTAL KNEE ARTHROPLASTY Right 12/26/2017   Procedure: RIGHT TOTAL KNEE ARTHROPLASTY;  Surgeon: Jane Charleston, MD;  Location: [facility] OR;  Service: Orthopedics;  Laterality: Right;   VIDEO BRONCHOSCOPY WITH ENDOBRONCHIAL NAVIGATION Right 12/12/2023   Procedure: VIDEO BRONCHOSCOPY WITH ENDOBRONCHIAL NAVIGATION;  Surgeon: Zaida Zola  N, MD;  Location: [facility] ENDOSCOPY;  Service: Pulmonary;  Laterality: Right;   VIDEO BRONCHOSCOPY WITH ENDOBRONCHIAL ULTRASOUND  12/12/2023   Procedure: BRONCHOSCOPY, WITH EBUS;  Surgeon: Zaida Zola SAILOR, MD;  Location: [facility] ENDOS[facility];  Service: Pulmonary;;    Family History  Problem Relation Age of Onset   Stroke Mother    Congestive Heart Failure Father    Stroke Maternal Grandmother    Breast cancer Paternal Aunt    Breast cancer Paternal Aunt    Breast cancer Paternal Aunt     Social History Social History   Tobacco Use   Smoking status: Never   Smokeless tobacco: Never  Vaping Use   Vaping status: Never Used  Substance Use Topics   Alcohol use: Not Currently    Comment: rarely   Drug use: No    Current Outpatient Medications  Medication Sig  Dispense Refill   ALPRAZolam  (XANAX ) 0.5 MG tablet Take 0.25 mg by mouth daily as needed for anxiety or sleep.     aspirin  EC 81 MG tablet Take 1 tablet (81 mg total) by mouth daily. Swallow whole.     CALCIUM -MAGNESIUM-ZINC PO Take 1 tablet by mouth daily.     cholecalciferol (VITAMIN D3) 25 MCG (1000 UNIT) tablet Take 1,000 Units by mouth daily.     diclofenac  Sodium (VOLTAREN ) 1 % GEL Apply 2 g topically as needed (pain).     famotidine  (PEPCID ) 40 MG tablet Take 40 mg by mouth every evening.     furosemide  (LASIX ) 20 MG tablet Take 20 mg by mouth daily.     ibuprofen (ADVIL) 200 MG tablet Take 400 mg by mouth at bedtime.     losartan  (COZAAR ) 50 MG tablet Take 50 mg by mouth daily.     metFORMIN  (GLUCOPHAGE -XR) 500 MG 24 hr tablet Take 500 mg by mouth daily with breakfast.     mometasone (ELOCON) 0.1 % cream Apply 1 Application topically daily as needed (eczema).     Multiple Vitamin (MULTIVITAMIN) tablet Take 1 tablet by mouth daily.     Omega-3 Fatty Acids (FISH OIL ) 1200 MG CAPS Take 2 capsules (2,400 mg total) by mouth daily.     pantoprazole  (PROTONIX ) 40 MG tablet Take 40 mg by mouth daily.     Probiotic Product (PROBIOTIC FORMULA PO) Take 1 tablet by mouth daily.     psyllium (REGULOID) 0.52 g capsule Take 3 capsules by mouth daily.     rosuvastatin  (CRESTOR ) 20 MG tablet Take 1 tablet (20 mg total) by mouth at bedtime. 90 tablet 0   No current facility-administered medications for this visit.    Not on File  Review of Systems  Constitutional:  Negative for activity change, appetite change and unexpected weight change.  HENT:  Negative for trouble swallowing and voice change.   Eyes:  Negative for visual disturbance.  Respiratory:  Negative for cough and shortness of breath.   Cardiovascular:  Positive for leg swelling (left foot). Negative for chest pain.  Gastrointestinal:  Positive for abdominal pain (reflux).  Musculoskeletal:  Positive for arthralgias and joint  swelling.  Neurological:  Negative for seizures, syncope and weakness.       Recent mini stroke  Psychiatric/Behavioral:  The patient is nervous/anxious.   All other systems reviewed and are negative.   BP (!) 200/85   Pulse (!) 104   Resp 20   Ht 5' 6 (1.676 m)   Wt 215 lb (97.5 kg)   SpO2 97% Comment: RA  BMI  34.70 kg/m  Physical Exam Vitals reviewed.  Constitutional:      General: She is not in acute distress.    Appearance: She is obese.  HENT:     Head: Normocephalic and atraumatic.  Eyes:     General: No scleral icterus.    Extraocular Movements: Extraocular movements intact.  Neck:     Vascular: No carotid bruit.  Cardiovascular:     Rate and Rhythm: Normal rate and regular rhythm.     Heart sounds: Murmur (2/6 systolic) heard.  Pulmonary:     Effort: Pulmonary effort is normal. No respiratory distress.     Breath sounds: Normal breath sounds. No wheezing or rales.  Abdominal:     General: There is no distension.     Palpations: Abdomen is soft.  Musculoskeletal:     Cervical back: Neck supple.  Lymphadenopathy:     Cervical: No cervical adenopathy.  Skin:    General: Skin is warm and dry.  Neurological:     General: No focal deficit present.     Mental Status: She is alert and oriented to person, place, and time.     Cranial Nerves: No cranial nerve deficit.     Motor: No weakness.     Diagnostic Tests: CT CHEST WITH CONTRAST 11/04/2023 09:06:47 AM   TECHNIQUE: CT of the chest was performed with the administration of 75 mL of iohexol  (OMNIPAQUE ) 350 MG/ML injection. Multiplanar reformatted images are provided for review. Automated exposure control, iterative reconstruction, and/or weight based adjustment of the mA/kV was utilized to reduce the radiation dose to as low as reasonably achievable.   COMPARISON: None available.   CLINICAL HISTORY: Abnormal xray - lung nodule, >= 1 cm.   FINDINGS:   MEDIASTINUM: Heart and pericardium are  unremarkable. The central airways are clear.   LYMPH NODES: No mediastinal, hilar or axillary lymphadenopathy.   LUNGS AND PLEURA: Central right upper lobe nodule adjacent to the bronchus measures 15 mm on image 45 series 5. No pulmonary edema. No pleural effusion or pneumothorax.   SOFT TISSUES/BONES: Degenerative spurring of the spine. No acute abnormality of the soft tissues.   UPPER ABDOMEN: Limited images of the upper abdomen demonstrates no acute abnormality.   IMPRESSION: 1. Central right upper lobe 15 mm pulmonary nodule adjacent to the bronchus; recommend dedicated follow-up with PET/CT and/or tissue sampling for further characterization. 2. These results will be called to the ordering clinician or representative by the radiologist assistant, and communication documented in the pacs or clario dashboard   Electronically signed by: Norleen Boxer MD 11/04/2023 09:43 AM EDT RP Workstation: [id number]  PET AND CT SKULL BASE TO MID THIGH 12/06/2023 05:14:49 PM   TECHNIQUE:   RADIOPHARMACEUTICAL: 10.86 mCi F-18 FDG Uptake time 60 minutes. Glucose level  mg/dl. Blood pool SUV 3.1 cm.   PET imaging was acquired from the base of the skull to the mid thighs. Non-contrast enhanced computed tomography was obtained for attenuation correction and anatomic localization.   COMPARISON: 11/04/2023   CLINICAL HISTORY: Lung nodule, > 8mm.   FINDINGS:   HEAD AND NECK: No metabolically active cervical lymphadenopathy. Bilateral, carotid atheromatous vascular calcification.   CHEST: The right upper lobe nodule measuring approximately 1.4 cm in diameter on image 63 series 7 has maximum SUV of 7.2, compatible with malignancy. Chest atheroma. Benign appearing calcifications along the right hemidiaphragm. No hypermetabolic adenopathy or findings of distant metastatic spread.   ABDOMEN AND PELVIS: No metabolically active intraperitoneal mass. No metabolically  active  lymphadenopathy. Physiologic activity within the gastrointestinal and genitourinary systems. Diffuse hepatic steatosis. Small type 1 hiatal hernia. Systemic atherosclerosis is present, including the aorta and iliac arteries. Small supraumbilical hernia contains adipose tissue.   BONES AND SOFT TISSUE: No abnormal FDG activity localizes to the bones. No metabolically active aggressive osseous lesion.   IMPRESSION: 1. Right upper lobe pulmonary nodule measuring 1.4 cm with SUV max 7.2, compatible with malignancy. 2. No hypermetabolic adenopathy or distant metastatic disease. 3. Systemic atherosclerosis, including carotid, aortic, and iliac artery calcifications. 4. Diffuse hepatic steatosis. 5. Small type 1 hiatal hernia. 6. Small supraumbilical fat-containing hernia.   Electronically signed by: Ryan Salvage MD 12/06/2023 05:27 PM EST RP Workstation: [id number]   I personally reviewed the CT and PET/CT images.  There is a 1.4 cm central right upper lobe nodule with an SUV max of 7.2.  No evidence of regional or distant metastatic disease.  Atherosclerotic cardiovascular disease.  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  Right upper lobe lung nodule-highly suspicious for a primary bronchogenic carcinoma.  Granulomatous disease and carcinoid tumors are also in the differential.  Degree of hypermetabolic activity is suggestive of malignancy.  We discussed options including radiographic monitoring, repeat bronchoscopy, and surgical resection.  Radiographic monitoring would be inappropriate in the setting.  It is possible, but in my opinion unlikely, that we would get an answer with bronchoscopy that would eliminate  the need for resection.  The most definitive option would be surgical resection for both diagnosis and definitive treatment.  I informed the general nature of the the procedure.  The plan would be to do a robotic assisted right upper lobectomy and lymph node dissection.  She understands the general nature of the procedure including the need for general anesthesia, the incision to be used, the use of the surgical robot, the possible need for conversion to an open procedure, the use of a drainage tube postoperatively, expected hospital stay, and the overall recovery.  I informed her of the indications, risk, benefits, and alternatives.  She understands the risks include, but are not limited to death, MI, DVT, PE, bleeding, possible need for transfusion, infection, prolonged air leak, cardiac arrhythmias, stroke or TIA, as well as the possibility of other unforeseeable complications.  She did have this small thalamic stroke about 2 months ago.  Will check with Dr. Rosemarie to see if we are okay to proceed with surgical resection.  If so the first available date is Wednesday, 01/18/2024.  She has an appointment with his APP on Monday.  Plan: Follow-up with neurology Tentatively plan for robotic assisted right upper lobectomy on 01/18/2024  I spent over 45 minutes in review of records, images, and consultation with Mrs. Ovando today. [name], MD [facility] [phone]

## 2024-01-04 ENCOUNTER — Inpatient Hospital Stay

## 2024-01-04 ENCOUNTER — Inpatient Hospital Stay: Attending: Internal Medicine | Admitting: Internal Medicine

## 2024-01-04 ENCOUNTER — Other Ambulatory Visit: Payer: Self-pay | Admitting: *Deleted

## 2024-01-04 VITALS — BP 178/71 | HR 116 | Temp 98.6°F | Resp 17 | Ht 66.0 in | Wt 218.0 lb

## 2024-01-04 DIAGNOSIS — K219 Gastro-esophageal reflux disease without esophagitis: Secondary | ICD-10-CM | POA: Insufficient documentation

## 2024-01-04 DIAGNOSIS — E119 Type 2 diabetes mellitus without complications: Secondary | ICD-10-CM | POA: Insufficient documentation

## 2024-01-04 DIAGNOSIS — Z923 Personal history of irradiation: Secondary | ICD-10-CM | POA: Diagnosis not present

## 2024-01-04 DIAGNOSIS — Z7982 Long term (current) use of aspirin: Secondary | ICD-10-CM | POA: Insufficient documentation

## 2024-01-04 DIAGNOSIS — Z803 Family history of malignant neoplasm of breast: Secondary | ICD-10-CM | POA: Diagnosis not present

## 2024-01-04 DIAGNOSIS — C3411 Malignant neoplasm of upper lobe, right bronchus or lung: Secondary | ICD-10-CM

## 2024-01-04 DIAGNOSIS — R911 Solitary pulmonary nodule: Secondary | ICD-10-CM | POA: Diagnosis not present

## 2024-01-04 DIAGNOSIS — Z7984 Long term (current) use of oral hypoglycemic drugs: Secondary | ICD-10-CM | POA: Insufficient documentation

## 2024-01-04 DIAGNOSIS — Z9049 Acquired absence of other specified parts of digestive tract: Secondary | ICD-10-CM | POA: Diagnosis not present

## 2024-01-04 DIAGNOSIS — Z823 Family history of stroke: Secondary | ICD-10-CM | POA: Insufficient documentation

## 2024-01-04 DIAGNOSIS — Z8701 Personal history of pneumonia (recurrent): Secondary | ICD-10-CM | POA: Diagnosis not present

## 2024-01-04 DIAGNOSIS — Z8673 Personal history of transient ischemic attack (TIA), and cerebral infarction without residual deficits: Secondary | ICD-10-CM | POA: Insufficient documentation

## 2024-01-04 DIAGNOSIS — M1711 Unilateral primary osteoarthritis, right knee: Secondary | ICD-10-CM | POA: Insufficient documentation

## 2024-01-04 DIAGNOSIS — Z96651 Presence of right artificial knee joint: Secondary | ICD-10-CM | POA: Diagnosis not present

## 2024-01-04 DIAGNOSIS — Z90710 Acquired absence of both cervix and uterus: Secondary | ICD-10-CM | POA: Diagnosis not present

## 2024-01-04 DIAGNOSIS — I10 Essential (primary) hypertension: Secondary | ICD-10-CM | POA: Insufficient documentation

## 2024-01-04 DIAGNOSIS — Z853 Personal history of malignant neoplasm of breast: Secondary | ICD-10-CM | POA: Insufficient documentation

## 2024-01-04 DIAGNOSIS — Z8249 Family history of ischemic heart disease and other diseases of the circulatory system: Secondary | ICD-10-CM | POA: Insufficient documentation

## 2024-01-04 DIAGNOSIS — Z79899 Other long term (current) drug therapy: Secondary | ICD-10-CM | POA: Diagnosis not present

## 2024-01-04 LAB — CBC WITH DIFFERENTIAL (CANCER CENTER ONLY)
Abs Immature Granulocytes: 0.03 K/uL (ref 0.00–0.07)
Basophils Absolute: 0.1 K/uL (ref 0.0–0.1)
Basophils Relative: 1 %
Eosinophils Absolute: 0.2 K/uL (ref 0.0–0.5)
Eosinophils Relative: 3 %
HCT: 38.3 % (ref 36.0–46.0)
Hemoglobin: 13.4 g/dL (ref 12.0–15.0)
Immature Granulocytes: 0 %
Lymphocytes Relative: 22 %
Lymphs Abs: 1.9 K/uL (ref 0.7–4.0)
MCH: 31.1 pg (ref 26.0–34.0)
MCHC: 35 g/dL (ref 30.0–36.0)
MCV: 88.9 fL (ref 80.0–100.0)
Monocytes Absolute: 0.6 K/uL (ref 0.1–1.0)
Monocytes Relative: 7 %
Neutro Abs: 5.9 K/uL (ref 1.7–7.7)
Neutrophils Relative %: 67 %
Platelet Count: 260 K/uL (ref 150–400)
RBC: 4.31 MIL/uL (ref 3.87–5.11)
RDW: 14.1 % (ref 11.5–15.5)
WBC Count: 8.7 K/uL (ref 4.0–10.5)
nRBC: 0 % (ref 0.0–0.2)

## 2024-01-04 LAB — CMP (CANCER CENTER ONLY)
ALT: 33 U/L (ref 0–44)
AST: 29 U/L (ref 15–41)
Albumin: 4.5 g/dL (ref 3.5–5.0)
Alkaline Phosphatase: 75 U/L (ref 38–126)
Anion gap: 11 (ref 5–15)
BUN: 15 mg/dL (ref 8–23)
CO2: 29 mmol/L (ref 22–32)
Calcium: 9.9 mg/dL (ref 8.9–10.3)
Chloride: 98 mmol/L (ref 98–111)
Creatinine: 0.67 mg/dL (ref 0.44–1.00)
GFR, Estimated: >60 mL/min (ref >60)
Glucose, Bld: 236 mg/dL — ABNORMAL HIGH (ref 70–99)
Potassium: 3.8 mmol/L (ref 3.5–5.1)
Sodium: 137 mmol/L (ref 135–145)
Total Bilirubin: 0.5 mg/dL (ref 0.0–1.2)
Total Protein: 7.3 g/dL (ref 6.5–8.1)

## 2024-01-04 NOTE — Progress Notes (Signed)
 [facility] [phone]   Fax:([phone]) [phone]  CONSULT NOTE  REFERRING PHYSICIAN: Dr. Zola Herter  REASON FOR CONSULTATION:  73 years old white female with suspicious lung cancer  HPI [name] is a 73 y.o. female never smoker with past medical history significant for right breast cancer diagnosed in June 2013, diabetes mellitus, GERD, hypertension, history of stroke as well as osteoarthritis and GERD.  The patient had acute neurodeficit and is suspicious for stroke in early October 2025.  During her evaluation she had CT angiogram of the head and neck on November 03, 2023 and incidentally it showed abnormal soft tissue adjacent to the bronchovascular bundle in the medial right upper lobe just superior to the right hilum suspicious for a pulmonary nodule/mass versus enlarged lymph node.  CT scan of the chest with contrast on November 08, 2023 showed central right upper lobe nodule adjacent to the bronchus and measuring 1.5 cm.  This was followed by a PET scan on December 06, 2023 which showed the right upper lobe pulmonary nodule measuring 1.4 cm with SUV max of 7.2 compatible with malignancy.  There was no hypermetabolic adenopathy or distant metastatic disease.  On December 12, 2023 the patient underwent video bronchoscopy with endobronchial ultrasound and electromagnetic navigation procedure by Dr. Herter.  The final cytology from the fine-needle aspiration of the right upper lobe pulmonary nodule ([id number]) showed no malignant cells identified. The patient was referred to me today for evaluation and recommendation regarding her condition. HPI  Discussed the use of AI scribe software for clinical note transcription with the patient, who gave verbal consent to proceed.  History of Present Illness [name] is a 73 year old female who presents for evaluation of a suspicious lung nodule.  A suspicious lung nodule was incidentally discovered during imaging  for a recent stroke. She underwent a CT of the head, MRI of the brain, and a CT angiogram of the head and neck, which revealed a right upper lobe nodule. A subsequent CT scan of the chest confirmed a 1.5 cm central right upper lobe nodule.  A PET scan on November 4th showed the nodule to be hypermetabolic. A bronchoscopy on November 10th was performed, but pathology results were negative.  Her past medical history includes breast cancer diagnosed in 2007, treated with lumpectomy, radiation, and five years of tamoxifen therapy. She also has high blood pressure, arthritis, acid reflux, stroke, pneumonia, and type 2 diabetes.  Family history reveals her mother had several strokes, and her father had congestive heart failure. There is no family history of cancer.  Socially, she is a retired engineer, civil (consulting), widowed, with two daughters, one of whom lives with her. She has never smoked and does not consume alcohol or use street drugs.  In the review of symptoms, she feels anxious but has no chest pain, breathing issues, nausea, vomiting, diarrhea, or headaches. She has no known medication allergies.     Past Medical History:  Diagnosis Date   Arthritis    Breast cancer (HCC)    Breast cancer, right breast (HCC) 07/21/2011   Diabetes mellitus without complication (HCC)    GERD (gastroesophageal reflux disease)    Hypertension    Personal history of radiation therapy 2007   Pneumonia 1988   h/o   PONV (postoperative nausea and vomiting)    Primary localized osteoarthritis of right knee 12/26/2017   Stroke [facility])       Past Surgical History:  Procedure Laterality Date  ABDOMINAL HYSTERECTOMY  1994   ANAL FISSURE REPAIR  1999   APPENDECTOMY  2015   BREAST BIOPSY Right 03/05/2005   BREAST LUMPECTOMY Right 03/25/2005   BREAST REDUCTION SURGERY  2011   BRONCHIAL BIOPSY  12/12/2023   Procedure: BRONCHOSCOPY, WITH BIOPSY;  Surgeon: Zaida Zola SAILOR, MD;  Location: [facility];  Service: Pulmonary;;    BRONCHIAL NEEDLE ASPIRATION BIOPSY  12/12/2023   Procedure: BRONCHOSCOPY, WITH NEEDLE ASPIRATION BIOPSY;  Surgeon: Zaida Zola SAILOR, MD;  Location: [facility];  Service: Pulmonary;;   KNEE ARTHROSCOPY Right    2013,2018   REDUCTION MAMMAPLASTY     TOTAL KNEE ARTHROPLASTY Right 12/26/2017   Procedure: RIGHT TOTAL KNEE ARTHROPLASTY;  Surgeon: Jane Charleston, MD;  Location: [facility] OR;  Service: Orthopedics;  Laterality: Right;   VIDEO BRONCHOSCOPY WITH ENDOBRONCHIAL NAVIGATION Right 12/12/2023   Procedure: VIDEO BRONCHOSCOPY WITH ENDOBRONCHIAL NAVIGATION;  Surgeon: Zaida Zola SAILOR, MD;  Location: [facility];  Service: Pulmonary;  Laterality: Right;   VIDEO BRONCHOSCOPY WITH ENDOBRONCHIAL ULTRASOUND  12/12/2023   Procedure: BRONCHOSCOPY, WITH EBUS;  Surgeon: Zaida Zola SAILOR, MD;  Location: [facility];  Service: Pulmonary;;    Family History  Problem Relation Age of Onset   Stroke Mother    Congestive Heart Failure Father    Stroke Maternal Grandmother    Breast cancer Paternal Aunt    Breast cancer Paternal Aunt    Breast cancer Paternal Aunt     Social History Social History   Tobacco Use   Smoking status: Never   Smokeless tobacco: Never  Vaping Use   Vaping status: Never Used  Substance Use Topics   Alcohol use: Not Currently    Comment: rarely   Drug use: No    Not on File  Current Outpatient Medications  Medication Sig Dispense Refill   ALPRAZolam  (XANAX ) 0.5 MG tablet Take 0.25 mg by mouth daily as needed for anxiety or sleep.     aspirin  EC 81 MG tablet Take 1 tablet (81 mg total) by mouth daily. Swallow whole.     CALCIUM -MAGNESIUM-ZINC PO Take 1 tablet by mouth daily.     cholecalciferol (VITAMIN D3) 25 MCG (1000 UNIT) tablet Take 1,000 Units by mouth daily.     diclofenac  Sodium (VOLTAREN ) 1 % GEL Apply 2 g topically as needed (pain).     famotidine  (PEPCID ) 40 MG tablet Take 40 mg by mouth every evening.     furosemide  (LASIX ) 20 MG tablet Take 20 mg by mouth  daily.     ibuprofen (ADVIL) 200 MG tablet Take 400 mg by mouth at bedtime.     losartan  (COZAAR ) 50 MG tablet Take 50 mg by mouth daily.     metFORMIN  (GLUCOPHAGE -XR) 500 MG 24 hr tablet Take 500 mg by mouth daily with breakfast.     mometasone (ELOCON) 0.1 % cream Apply 1 Application topically daily as needed (eczema).     Multiple Vitamin (MULTIVITAMIN) tablet Take 1 tablet by mouth daily.     Omega-3 Fatty Acids (FISH OIL ) 1200 MG CAPS Take 2 capsules (2,400 mg total) by mouth daily.     pantoprazole  (PROTONIX ) 40 MG tablet Take 40 mg by mouth daily.     Probiotic Product (PROBIOTIC FORMULA PO) Take 1 tablet by mouth daily.     psyllium (REGULOID) 0.52 g capsule Take 3 capsules by mouth daily.     rosuvastatin  (CRESTOR ) 20 MG tablet Take 1 tablet (20 mg total) by mouth at bedtime. 90 tablet 0   No  current facility-administered medications for this visit.    Review of Systems  Constitutional: negative Eyes: negative Ears, nose, mouth, throat, and face: negative Respiratory: negative Cardiovascular: negative Gastrointestinal: negative Genitourinary:negative Integument/breast: negative Hematologic/lymphatic: negative Musculoskeletal:negative Neurological: negative Behavioral/Psych: positive for anxiety Endocrine: negative Allergic/Immunologic: negative  Physical Exam  MJO:jozmu, healthy, no distress, well nourished, well developed, and anxious SKIN: skin color, texture, turgor are normal, no rashes or significant lesions HEAD: Normocephalic, No masses, lesions, tenderness or abnormalities EYES: normal, PERRLA, Conjunctiva are pink and non-injected EARS: External ears normal, Canals clear OROPHARYNX:no exudate, no erythema, and lips, buccal mucosa, and tongue normal  NECK: supple, no adenopathy, no JVD LYMPH:  no palpable lymphadenopathy, no hepatosplenomegaly BREAST:not examined LUNGS: clear to auscultation , and palpation HEART: regular rate & rhythm, no murmurs, and no  gallops ABDOMEN:abdomen soft, non-tender, normal bowel sounds, and no masses or organomegaly BACK: Back symmetric, no curvature., No CVA tenderness EXTREMITIES:no joint deformities, effusion, or inflammation, no edema  NEURO: alert & oriented x 3 with fluent speech, no focal motor/sensory deficits  PERFORMANCE STATUS: ECOG 1  LABORATORY DATA: Lab Results  Component Value Date   WBC 6.5 12/12/2023   HGB 13.3 12/12/2023   HCT 38.2 12/12/2023   MCV 89.3 12/12/2023   PLT 227 12/12/2023      Chemistry      Component Value Date/Time   NA 138 12/12/2023 1406   K 3.8 12/12/2023 1406   CL 100 12/12/2023 1406   CO2 25 12/12/2023 1406   BUN 9 12/12/2023 1406   CREATININE 0.71 12/12/2023 1406      Component Value Date/Time   CALCIUM  9.4 12/12/2023 1406   ALKPHOS 69 12/15/2017 1132   AST 44 (H) 12/15/2017 1132   ALT 51 (H) 12/15/2017 1132   BILITOT 0.9 12/15/2017 1132       RADIOGRAPHIC STUDIES: DG Chest Port 1 View Result Date: 12/12/2023 CLINICAL DATA:  Status post bronchoscopy. EXAM: PORTABLE CHEST 1 VIEW COMPARISON:  None Available. FINDINGS: No focal consolidation, pleural effusion or pneumothorax. The cardiac silhouette is within normal limits. No acute osseous pathology. IMPRESSION: No active disease. Electronically Signed   By: Vanetta Chou M.D.   On: 12/12/2023 12:34   DG C-ARM BRONCHOSCOPY Result Date: 12/12/2023 C-ARM BRONCHOSCOPY: Fluoroscopy was utilized by the requesting physician.  No radiographic interpretation.   DG C-Arm 1-60 Min-No Report Result Date: 12/12/2023 Fluoroscopy was utilized by the requesting physician.  No radiographic interpretation.   NM PET SUPER D CT Result Date: 12/06/2023 EXAM: PET AND CT SKULL BASE TO MID THIGH 12/06/2023 05:10:00 PM TECHNIQUE: RADIOPHARMACEUTICAL:  mCi F-18 FDG Uptake time 60 minutes. Glucose level  mg/dl. PET imaging was acquired from the base of the skull to the mid thighs. Non-contrast enhanced computed tomography  was obtained for attenuation correction and anatomic localization. COMPARISON: None available. CLINICAL HISTORY: Lung nodule, > 8 mm. FINDINGS: HEAD AND NECK: No metabolically active cervical lymphadenopathy. CHEST: A central right upper lobe nodule measures 1.6 x 1.0 cm on image 51 series 4. This nodule was hypermetabolic on PET CT and suspicious for malignancy. Associated airway plugging posteriorly in the right upper lobe. A 2 x 3 mm right upper lobe nodule on image 31 series 4 is nonspecific due to small size. Benign appearing calcifications along the right hemidiaphragm. Thoracic spondylosis. Mild dextroconvex thoracic scoliosis. No metabolically active lymphadenopathy. ABDOMEN AND PELVIS: Abdominal aortic atherosclerosis. Diffuse hepatic steatosis. No metabolically active intraperitoneal mass. No metabolically active lymphadenopathy. Physiologic activity within the gastrointestinal and genitourinary  systems. BONES AND SOFT TISSUE: No abnormal FDG activity localizes to the bones. No metabolically active aggressive osseous lesion. IMPRESSION: 1. Central right upper lobe 1.6 x 1.0 cm hypermetabolic nodule suspicious for malignancy, with associated right upper lobe airway plugging posteriorly. 2. Aortic and coronary atherosclerosis. 3. Diffuse hepatic steatosis. 4. Thoracic spondylosis. 5. Mild dextroconvex thoracic scoliosis. Electronically signed by: Ryan Salvage MD 12/06/2023 05:31 PM EST RP Workstation: [id number]   NM PET Image Initial (PI) Skull Base To Thigh Result Date: 12/06/2023 EXAM: PET AND CT SKULL BASE TO MID THIGH 12/06/2023 05:14:49 PM TECHNIQUE: RADIOPHARMACEUTICAL: 10.86 mCi F-18 FDG Uptake time 60 minutes. Glucose level  mg/dl. Blood pool SUV 3.1 cm. PET imaging was acquired from the base of the skull to the mid thighs. Non-contrast enhanced computed tomography was obtained for attenuation correction and anatomic localization. COMPARISON: 11/04/2023 CLINICAL HISTORY: Lung nodule, > 8mm.  FINDINGS: HEAD AND NECK: No metabolically active cervical lymphadenopathy. Bilateral, carotid atheromatous vascular calcification. CHEST: The right upper lobe nodule measuring approximately 1.4 cm in diameter on image 63 series 7 has maximum SUV of 7.2, compatible with malignancy. Chest atheroma. Benign appearing calcifications along the right hemidiaphragm. No hypermetabolic adenopathy or findings of distant metastatic spread. ABDOMEN AND PELVIS: No metabolically active intraperitoneal mass. No metabolically active lymphadenopathy. Physiologic activity within the gastrointestinal and genitourinary systems. Diffuse hepatic steatosis. Small type 1 hiatal hernia. Systemic atherosclerosis is present, including the aorta and iliac arteries. Small supraumbilical hernia contains adipose tissue. BONES AND SOFT TISSUE: No abnormal FDG activity localizes to the bones. No metabolically active aggressive osseous lesion. IMPRESSION: 1. Right upper lobe pulmonary nodule measuring 1.4 cm with SUV max 7.2, compatible with malignancy. 2. No hypermetabolic adenopathy or distant metastatic disease. 3. Systemic atherosclerosis, including carotid, aortic, and iliac artery calcifications. 4. Diffuse hepatic steatosis. 5. Small type 1 hiatal hernia. 6. Small supraumbilical fat-containing hernia. Electronically signed by: Ryan Salvage MD 12/06/2023 05:27 PM EST RP Workstation: [id number]    ASSESSMENT: This is a very pleasant 73 years old white female with highly suspicious of stage Ia (T1b, N0, M0) lung cancer likely non-small cell, adenocarcinoma presented with right upper lobe lung nodule pending tissue diagnosis.   PLAN: I had a lengthy discussion with the patient today about her current disease condition and treatment options.  I personally and independently reviewed the imaging studies and discussed the result with the patient today. Assessment and Plan Assessment & Plan Suspicious right upper lobe lung nodule, likely  early-stage non-small cell lung cancer A 1.5 cm hypermetabolic nodule in the right upper lobe, identified incidentally during stroke evaluation, is highly suspicious for early-stage non-small cell lung cancer, likely stage IA due to its small size. Differential diagnosis includes carcinoid tumor. Bronchoscopy was negative, but malignancy remains highly suspected. Surgery is planned as the primary treatment, with no immediate need for chemotherapy, radiation, or targeted therapy unless post-surgical findings indicate a higher stage. - Proceed with scheduled surgery on December 17th, 2025 by Dr. Kerrin. - Will evaluate surgical specimen for definitive diagnosis. - If surgery confirms lung cancer, will consider full lobectomy. - If surgery reveals carcinoid tumor, will consider limited resection. - Will schedule follow-up appointment in mid-January to discuss surgical outcomes and further management. The patient was advised to call immediately if she has any other concerning symptoms in the interval.  The patient voices understanding of current disease status and treatment options and is in agreement with the current care plan.  All questions were answered. The patient knows to call  the clinic with any problems, questions or concerns. We can certainly see the patient much sooner if necessary.  Thank you so much for allowing me to participate in the care of Bethanee O Cheek. I will continue to follow up the patient with you and assist in her care.  The total time spent in the appointment was 60 minutes including review of chart and various tests results, discussions about plan of care and coordination of care plan .   Disclaimer: This note was dictated with voice recognition software. Similar sounding words can inadvertently be transcribed and may not be corrected upon review.   Sherrod MARLA Sherrod January 04, 2024, 12:41 PM

## 2024-01-04 NOTE — Progress Notes (Signed)
 NN called pt at this time to let her know that she would be unable to attend her consult appt today, however wanted to make self introduction and explain the role of a NN. NN gave pt her direct phone number and encouraged her to call with any questions or concerns. Pt verbalized understanding and appreciation for information provided.

## 2024-01-05 NOTE — Progress Notes (Signed)
 [facility] [address] [place]. [place] [address] [phone]       HOSPITAL FOLLOW UP NOTE  Ms. [name] Date of Birth:  1950-06-23 Medical Record Number:  [id number]   Reason for Referral:  hospital stroke follow up    SUBJECTIVE:   CHIEF COMPLAINT:  Chief Complaint  Patient presents with   RM 8     Patient is here alone for a hospital follow-up for stroke - October 2nd 2025 - no lingering symptoms     HPI:   Ms. [name] is a 73 y.o. female with history of breast CA s/p radiation (in remission), GERD, HTN, T2DM who presented to ED on 11/03/2023 with R facial/ perioral numbness and right thumb/index finger numbness.  Stroke workup showed acute punctate left thalamic infarct likely secondary to small vessel disease.  Further workup as noted below.  Recommended DAPT for 3 weeks and aspirin  alone and initiated rosuvastatin  20 mg daily.  Incidental finding of RUL solitary pulmonary nodule on CTA head/neck and advised outpatient follow-up.  No therapy needs identified by therapy and discharged home.   Today, 01/09/2024, patient is being seen for initial hospital follow-up unaccompanied.  Overall doing well from stroke standpoint without residual deficits or new stroke/TIA symptoms.  She has returned back to all prior activities without difficulty.  Completed 3 weeks DAPT, remains on aspirin  alone as well as Crestor .  She does report worsening baseline joint pain since initiating Crestor .  Blood pressure routinely monitored at home and typically 130s/70s, has been elevated at appointments.  She has since been seen by PCP and scheduled for follow-up visit in March. She did have follow-up with pulmonology for right upper lobe lung nodule suspected early-stage non-small cell lung cancer.  She is scheduled for lobectomy next week. Per patient, Dr. Kerrin requested discussion regarding safety of pursuing lobectomy in setting of recent  stroke.       PERTINENT IMAGING  Code Stroke CT head No acute abnormality. ASPECTS 10.    CTA head & neck: No LVO. Mild stenosis of the right supraclinoid ICA. Mild stenosis of the distal M1 segment left MCA and additional mild stenosis of a proximal M2 branch of the left MCA. Moderate to severe stenosis of the P2 segment left PCA.  MRI: 4 mm acute infarct within the left thalamus. Chronic lacunar infarct within the left caudate head. Mild-to-moderate chronic small vessel ischemic changes within the pons. 2D Echo LVEF >75%, left atrial size normal LDL 109 HgbA1c 5.6    ROS:   14 system review of systems performed and negative with exception of those listed in HPI  PMH:  Past Medical History:  Diagnosis Date   Arthritis    Breast cancer (HCC)    Breast cancer, right breast (HCC) 07/21/2011   Diabetes mellitus without complication (HCC)    GERD (gastroesophageal reflux disease)    Hypertension    Personal history of radiation therapy 2007   Pneumonia 1988   h/o   PONV (postoperative nausea and vomiting)    Primary localized osteoarthritis of right knee 12/26/2017   Stroke [facility])     PSH:  Past Surgical History:  Procedure Laterality Date   ABDOMINAL HYSTERECTOMY  1994   ANAL FISSURE REPAIR  1999   APPENDECTOMY  2015   BREAST BIOPSY Right 03/05/2005   BREAST LUMPECTOMY Right 03/25/2005   BREAST REDUCTION SURGERY  2011   BRONCHIAL BIOPSY  12/12/2023   Procedure: BRONCHOSCOPY, WITH BIOPSY;  Surgeon: Zaida,  Zola SAILOR, MD;  Location: [facility];  Service: Pulmonary;;   BRONCHIAL NEEDLE ASPIRATION BIOPSY  12/12/2023   Procedure: BRONCHOSCOPY, WITH NEEDLE ASPIRATION BIOPSY;  Surgeon: Zaida Zola SAILOR, MD;  Location: [facility];  Service: Pulmonary;;   KNEE ARTHROSCOPY Right    2013,2018   REDUCTION MAMMAPLASTY     TOTAL KNEE ARTHROPLASTY Right 12/26/2017   Procedure: RIGHT TOTAL KNEE ARTHROPLASTY;  Surgeon: Jane Charleston, MD;  Location: [facility] OR;  Service: Orthopedics;   Laterality: Right;   VIDEO BRONCHOSCOPY WITH ENDOBRONCHIAL NAVIGATION Right 12/12/2023   Procedure: VIDEO BRONCHOSCOPY WITH ENDOBRONCHIAL NAVIGATION;  Surgeon: Zaida Zola SAILOR, MD;  Location: [facility];  Service: Pulmonary;  Laterality: Right;   VIDEO BRONCHOSCOPY WITH ENDOBRONCHIAL ULTRASOUND  12/12/2023   Procedure: BRONCHOSCOPY, WITH EBUS;  Surgeon: Zaida Zola SAILOR, MD;  Location: [facility];  Service: Pulmonary;;    Social History:  Social History   Socioeconomic History   Marital status: Widowed    Spouse name: Not on file   Number of children: Not on file   Years of education: Not on file   Highest education level: Not on file  Occupational History   Not on file  Tobacco Use   Smoking status: Never   Smokeless tobacco: Never  Vaping Use   Vaping status: Never Used  Substance and Sexual Activity   Alcohol use: Not Currently    Comment: rarely   Drug use: No   Sexual activity: Not on file  Other Topics Concern   Not on file  Social History Narrative   2 cups of coffee daily and a glass of soda    Social Drivers of Health   Financial Resource Strain: Low Risk (04/18/2023)   [facility] from [facility]   Overall Financial Resource Strain (CARDIA)    Difficulty of Paying Living Expenses: Not hard at all  Food Insecurity: No Food Insecurity (01/02/2024)   Hunger Vital Sign    Worried About Running Out of Food in the Last Year: Never true    Ran Out of Food in the Last Year: Never true  Transportation Needs: No Transportation Needs (01/02/2024)   PRAPARE - Administrator, Civil Service (Medical): No    Lack of Transportation (Non-Medical): No  Physical Activity: Insufficiently Active (04/18/2023)   Received from [facility]   Exercise Vital Sign    On average, how many days per week do you engage in moderate to strenuous exercise (like a brisk walk)?: 2 days    On average, how many minutes do you engage in exercise at this level?: 10 min  Stress:  Stress Concern Present (04/18/2023)   Received from [facility] - Occupational Stress Questionnaire    Feeling of Stress : To some extent  Social Connections: Moderately Integrated (11/03/2023)   Social Connection and Isolation Panel    Frequency of Communication with Friends and Family: Three times a week    Frequency of Social Gatherings with Friends and Family: Three times a week    Attends Religious Services: 1 to 4 times per year    Active Member of Clubs or Organizations: Yes    Attends Banker Meetings: 1 to 4 times per year    Marital Status: Widowed  Intimate Partner Violence: Not At Risk (11/03/2023)   Humiliation, Afraid, Rape, and Kick questionnaire    Fear of Current or Ex-Partner: No    Emotionally Abused: No    Physically Abused: No  Sexually Abused: No    Family History:  Family History  Problem Relation Age of Onset   Stroke Mother    Congestive Heart Failure Father    Stroke Maternal Grandmother    Breast cancer Paternal Aunt    Breast cancer Paternal Aunt    Breast cancer Paternal Aunt    Seizures Neg Hx    Migraines Neg Hx    Sleep apnea Neg Hx     Medications:   Current Outpatient Medications on File Prior to Visit  Medication Sig Dispense Refill   ALPRAZolam  (XANAX ) 0.5 MG tablet Take 0.25 mg by mouth daily as needed for anxiety or sleep.     aspirin  EC 81 MG tablet Take 1 tablet (81 mg total) by mouth daily. Swallow whole.     CALCIUM -MAGNESIUM-ZINC PO Take 1 tablet by mouth daily.     cholecalciferol (VITAMIN D3) 25 MCG (1000 UNIT) tablet Take 1,000 Units by mouth daily.     diclofenac  Sodium (VOLTAREN ) 1 % GEL Apply 2 g topically as needed (pain).     famotidine  (PEPCID ) 40 MG tablet Take 40 mg by mouth every evening.     furosemide  (LASIX ) 20 MG tablet Take 20 mg by mouth daily.     ibuprofen (ADVIL) 200 MG tablet Take 400 mg by mouth at bedtime.     losartan  (COZAAR ) 50 MG tablet Take 50  mg by mouth daily. (Patient taking differently: Take 25 mg by mouth daily.)     metFORMIN  (GLUCOPHAGE -XR) 500 MG 24 hr tablet Take 500 mg by mouth daily with breakfast.     mometasone (ELOCON) 0.1 % cream Apply 1 Application topically daily as needed (eczema).     Multiple Vitamin (MULTIVITAMIN) tablet Take 1 tablet by mouth daily.     Omega-3 Fatty Acids (FISH OIL ) 1200 MG CAPS Take 2 capsules (2,400 mg total) by mouth daily.     pantoprazole  (PROTONIX ) 40 MG tablet Take 40 mg by mouth daily.     Probiotic Product (PROBIOTIC FORMULA PO) Take 1 tablet by mouth daily.     psyllium (REGULOID) 0.52 g capsule Take 3 capsules by mouth daily.     rosuvastatin  (CRESTOR ) 20 MG tablet Take 1 tablet (20 mg total) by mouth at bedtime. 90 tablet 0   No current facility-administered medications on file prior to visit.    Allergies:  No Known Allergies    OBJECTIVE:  Physical Exam  Vitals:   01/09/24 0850  BP: (!) 154/88  Pulse: 88  SpO2: 99%  Weight: 217 lb 12.8 oz (98.8 kg)  Height: 5' 6 (1.676 m)   Body mass index is 35.15 kg/m. No results found.  General: well developed, well nourished, seated, in no evident distress  Neurologic Exam Mental Status: Awake and fully alert.  Fluent speech and language.  Oriented to place and time. Recent and remote memory intact. Attention span, concentration and fund of knowledge appropriate. Mood and affect appropriate.  Cranial Nerves: Fundoscopic exam reveals sharp disc margins. Pupils equal, briskly reactive to light. Extraocular movements full without nystagmus. Visual fields full to confrontation. Hearing intact. Facial sensation intact. Face, tongue, palate moves normally and symmetrically.  Motor: Normal bulk and tone. Normal strength in all tested extremity muscles Sensory.: intact to touch , pinprick , position and vibratory sensation.  Coordination: Rapid alternating movements normal in all extremities. Finger-to-nose and heel-to-shin  performed accurately bilaterally. Gait and Station: Arises from chair without difficulty. Stance is normal. Gait demonstrates normal stride length and balance  without use of AD.  Reflexes: 1+ and symmetric. Toes downgoing.     NIHSS  0 Modified Rankin  0      ASSESSMENT: [name] is a 73 y.o. year old female with left thalamic stroke in 11/03/2023 likely secondary to small vessel disease. Vascular risk factors include HTN, HLD, DM, advanced age, obesity, and multifocal intracranial stenosis.      PLAN:  Left thalamic stroke:  Recovered well without residual deficit. Continue aspirin  81mg  daily and rosuvastatin  (Crestor ) for secondary stroke prevention managed/prescribed by PCP.   Will repeat lipid panel today. If LDL near/below goal, can decrease dose of Crestor  due to worsening joint pain since starting. Also discussed trial of co-Q10.  Discussed secondary stroke prevention measures and importance of close PCP follow up for aggressive stroke risk factor management including BP goal<130/90, HLD with LDL goal<70 and DM with A1c.<7 .  Stroke labs 11/2023: LDL 109, A1c 5.6 I have gone over the pathophysiology of stroke, warning signs and symptoms, risk factors and their management in some detail with instructions to go to the closest emergency room for symptoms of concern.  Lung nodule: Routinely follows with pulmonology and oncology Suspicious for early stage non-small cell lung cancer Scheduled for lobectomy 12/17 with Dr. Kerrin for primary treatment, no immediate need for chemotherapy, radiation of targeted therapy unless postsurgical findings indicate higher stage.  Discussed risk vs benefit of undergoing procedure with recent stroke. Advised typically recommend waiting at least 3 months post stroke prior to undergoing elective procedure but due to indication of procedure, I do believe benefit would outweigh potential risk.  Did discuss potential risk of recurrent stroke  and patient verbalized understanding. Aspirin  can be continued per patient. Will notify Dr. Kerrin.     No further recommendations from stroke standpoint and is closely followed by PCP.  She can follow-up here on an as-needed basis but advised to call with any questions or concerns in the future.   CC:  [facility] provider: Dr. Rosemarie PCP: Teresa Aldona CROME, NP    I personally spent a total of 55 minutes in the care of the patient today including preparing to see the patient, getting/reviewing separately obtained history, performing a medically appropriate exam/evaluation, counseling and educating, placing orders, referring and communicating with other health care professionals, and documenting clinical information in the EHR.    Harlene Bogaert, AGNP-BC  [facility] [address] [place], [place] [address]  Phone [phone] Fax [phone] Note: This document was prepared with digital dictation and possible smart phrase technology. Any transcriptional errors that result from this process are unintentional.

## 2024-01-09 ENCOUNTER — Ambulatory Visit: Payer: Self-pay | Admitting: Adult Health

## 2024-01-09 ENCOUNTER — Encounter: Payer: Self-pay | Admitting: Adult Health

## 2024-01-09 VITALS — BP 154/88 | HR 88 | Ht 66.0 in | Wt 217.8 lb

## 2024-01-09 DIAGNOSIS — E785 Hyperlipidemia, unspecified: Secondary | ICD-10-CM | POA: Diagnosis not present

## 2024-01-09 DIAGNOSIS — R911 Solitary pulmonary nodule: Secondary | ICD-10-CM

## 2024-01-09 DIAGNOSIS — I6381 Other cerebral infarction due to occlusion or stenosis of small artery: Secondary | ICD-10-CM | POA: Diagnosis not present

## 2024-01-09 NOTE — Patient Instructions (Addendum)
 Continue aspirin  81 mg daily  and Crestor  20 mg daily for secondary stroke prevention  We will check cholesterol levels today and will be notified via MyChart regarding results We can consider decreasing Crestor  dosage based on levels Can try CoQ10 300mg  daily or 150mg  twice daily to help with joint pain   Continue to follow up with PCP regarding blood pressure and cholesterol management  Maintain strict control of hypertension with blood pressure goal below 130/90 and cholesterol with LDL cholesterol (bad cholesterol) goal below 70 mg/dL.   Signs of a Stroke? Follow the BEFAST method:  Balance Watch for a sudden loss of balance, trouble with coordination or vertigo Eyes Is there a sudden loss of vision in one or both eyes? Or double vision?  Face: Ask the person to smile. Does one side of the face droop or is it numb?  Arms: Ask the person to raise both arms. Does one arm drift downward? Is there weakness or numbness of a leg? Speech: Ask the person to repeat a simple phrase. Does the speech sound slurred/strange? Is the person confused ? Time: If you observe any of these signs, call 911.        Thank you for coming to see us  at [facility]. I hope we have been able to provide you high quality care today.  You may receive a patient satisfaction survey over the next few weeks. We would appreciate your feedback and comments so that we may continue to improve ourselves and the health of our patients.    Stroke Prevention Some medical conditions and behaviors can lead to a higher chance of having a stroke. You can help prevent a stroke by eating healthy, exercising, not smoking, and managing any medical conditions you have. Stroke is a leading cause of functional impairment. Primary prevention is particularly important because a majority of strokes are first-time events. Stroke changes the lives of not only those who experience a stroke but also their family and other  caregivers. How can this condition affect me? A stroke is a medical emergency and should be treated right away. A stroke can lead to brain damage and can sometimes be life-threatening. If a person gets medical treatment right away, there is a better chance of surviving and recovering from a stroke. What can increase my risk? The following medical conditions may increase your risk of a stroke: Cardiovascular disease. High blood pressure (hypertension). Diabetes. High cholesterol. Sickle cell disease. Blood clotting disorders (hypercoagulable state). Obesity. Sleep disorders (obstructive sleep apnea). Other risk factors include: Being older than age 31. Having a history of blood clots, stroke, or mini-stroke (transient ischemic attack, TIA). Genetic factors, such as race, ethnicity, or a family history of stroke. Smoking cigarettes or using other tobacco products. Taking birth control pills, especially if you also use tobacco. Heavy use of alcohol or drugs, especially cocaine and methamphetamine. Physical inactivity. What actions can I take to prevent this? Manage your health conditions High cholesterol levels. Eating a healthy diet is important for preventing high cholesterol. If cholesterol cannot be managed through diet alone, you may need to take medicines. Take any prescribed medicines to control your cholesterol as told by your health care provider. Hypertension. To reduce your risk of stroke, try to keep your blood pressure below 130/80. Eating a healthy diet and exercising regularly are important for controlling blood pressure. If these steps are not enough to manage your blood pressure, you may need to take medicines. Take any prescribed medicines to  control hypertension as told by your health care provider. Ask your health care provider if you should monitor your blood pressure at home. Have your blood pressure checked every year, even if your blood pressure is normal. Blood  pressure increases with age and some medical conditions. Diabetes. Eating a healthy diet and exercising regularly are important parts of managing your blood sugar (glucose). If your blood sugar cannot be managed through diet and exercise, you may need to take medicines. Take any prescribed medicines to control your diabetes as told by your health care provider. Get evaluated for obstructive sleep apnea. Talk to your health care provider about getting a sleep evaluation if you snore a lot or have excessive sleepiness. Make sure that any other medical conditions you have, such as atrial fibrillation or atherosclerosis, are managed. Nutrition Follow instructions from your health care provider about what to eat or drink to help manage your health condition. These instructions may include: Reducing your daily calorie intake. Limiting how much salt (sodium) you use to 1,500 milligrams (mg) each day. Using only healthy fats for cooking, such as olive oil, canola oil, or sunflower oil. Eating healthy foods. You can do this by: Choosing foods that are high in fiber, such as whole grains, and fresh fruits and vegetables. Eating at least 5 servings of fruits and vegetables a day. Try to fill one-half of your plate with fruits and vegetables at each meal. Choosing lean protein foods, such as lean cuts of meat, poultry without skin, fish, tofu, beans, and nuts. Eating low-fat dairy products. Avoiding foods that are high in sodium. This can help lower blood pressure. Avoiding foods that have saturated fat, trans fat, and cholesterol. This can help prevent high cholesterol. Avoiding processed and prepared foods. Counting your daily carbohydrate intake.  Lifestyle If you drink alcohol: Limit how much you have to: 0-1 drink a day for women who are not pregnant. 0-2 drinks a day for men. Know how much alcohol is in your drink. In the [place] one drink equals one 12 oz bottle of beer ( ), one 5 oz glass of  wine ( ), or one 1 oz glass of hard liquor (44mL). Do not use any products that contain nicotine or tobacco. These products include cigarettes, chewing tobacco, and vaping devices, such as e-cigarettes. If you need help quitting, ask your health care provider. Avoid secondhand smoke. Do not use drugs. Activity  Try to stay at a healthy weight. Get at least 30 minutes of exercise on most days, such as: Fast walking. Biking. Swimming. Medicines Take over-the-counter and prescription medicines only as told by your health care provider. Aspirin  or blood thinners (antiplatelets or anticoagulants) may be recommended to reduce your risk of forming blood clots that can lead to stroke. Avoid taking birth control pills. Talk to your health care provider about the risks of taking birth control pills if: You are over 38 years old. You smoke. You get very bad headaches. You have had a blood clot. Where to find more information American Stroke Association: www.strokeassociation.org Get help right away if: You or a loved one has any symptoms of a stroke. BE FAST is an easy way to remember the main warning signs of a stroke: B - Balance. Signs are dizziness, sudden trouble walking, or loss of balance. E - Eyes. Signs are trouble seeing or a sudden change in vision. F - Face. Signs are sudden weakness or numbness of the face, or the face or eyelid drooping on one side. A -  Arms. Signs are weakness or numbness in an arm. This happens suddenly and usually on one side of the body. S - Speech. Signs are sudden trouble speaking, slurred speech, or trouble understanding what people say. T - Time. Time to call emergency services. Write down what time symptoms started. You or a loved one has other signs of a stroke, such as: A sudden, severe headache with no known cause. Nausea or vomiting. Seizure. These symptoms may represent a serious problem that is an emergency. Do not wait to see if the symptoms  will go away. Get medical help right away. Call your local emergency services (911 in the [place] Do not drive yourself to the hospital. Summary You can help to prevent a stroke by eating healthy, exercising, not smoking, limiting alcohol intake, and managing any medical conditions you may have. Do not use any products that contain nicotine or tobacco. These include cigarettes, chewing tobacco, and vaping devices, such as e-cigarettes. If you need help quitting, ask your health care provider. Remember BE FAST for warning signs of a stroke. Get help right away if you or a loved one has any of these signs. This information is not intended to replace advice given to you by your health care provider. Make sure you discuss any questions you have with your health care provider. Document Revised: 12/21/2021 Document Reviewed: 12/21/2021 Elsevier Patient Education  2024 [employer].

## 2024-01-10 ENCOUNTER — Ambulatory Visit: Payer: Self-pay | Admitting: Adult Health

## 2024-01-10 LAB — LIPID PANEL
Chol/HDL Ratio: 2.2 ratio (ref 0.0–4.4)
Cholesterol, Total: 132 mg/dL (ref 100–199)
HDL: 59 mg/dL (ref >39)
LDL Chol Calc (NIH): 34 mg/dL (ref 0–99)
Triglycerides: 263 mg/dL — ABNORMAL HIGH (ref 0–149)
VLDL Cholesterol Cal: 39 mg/dL (ref 5–40)

## 2024-01-10 MED ORDER — ROSUVASTATIN CALCIUM 10 MG PO TABS: 10.0000 mg | ORAL_TABLET | Freq: Every day | ORAL | 5 refills | Status: AC

## 2024-01-11 NOTE — Addendum Note (Signed)
 Addended by: LINDSEY-MILLS, Kartier Bennison on: 01/11/2024 09:05 AM   Modules accepted: Orders

## 2024-01-11 NOTE — Progress Notes (Signed)
 I agree with the above plan

## 2024-01-13 NOTE — Progress Notes (Signed)
 Surgical Instructions   Your procedure is scheduled on Wednesday, December 17th. Report to [facility] Main Entrance A at 8:30 A.M., then check in with the Admitting office. Any questions or running late day of surgery: call [phone]  Questions prior to your surgery date: call [phone], Monday-Friday, 8am-4pm. If you experience any cold or flu symptoms such as cough, fever, chills, shortness of breath, etc. between now and your scheduled surgery, please notify us  at the above number.     Remember:  Do not eat or drink after midnight the night before your surgery. This includes no gum, mints, or hard candy.   Take these medicines the morning of surgery with A SIP OF WATER  pantoprazole  (PROTONIX )    May take these medicines IF NEEDED: ALPRAZolam  (XANAX )    HOLD your aspirin  on day of surgery.  Last dose on 01-17-24, Tuesday.  Per your surgeon's instruction, HOLD your metFORMIN  (GLUCOPHAGE -XR) for 2 day's prior to surgery.  Last dose on Sunday, December 14th.  As of today, HOLD your Omega-3 Fatty Acids (FISH OIL ).     One week prior to surgery, STOP taking any Aleve, Naproxen, Motrin, Goody's, BC's, all herbal medications, fish oil , and non-prescription vitamins. This includes your ibuprofen (ADVIL) .                     Do NOT Smoke (Tobacco/Vaping) for 24 hours prior to your procedure.  If you use a CPAP at night, you may bring your mask/headgear for your overnight stay.   You will be asked to remove any contacts, glasses, piercing's, hearing aid's, dentures/partials prior to surgery. Please bring cases for these items if needed.    Patients discharged the day of surgery will not be allowed to drive home, and someone needs to stay with them for 24 hours.  SURGICAL WAITING ROOM VISITATION Patients may have no more than 2 support people in the waiting area - these visitors may rotate.   Pre-op nurse will coordinate an appropriate time for 1 ADULT support person, who  may not rotate, to accompany patient in pre-op.  Children under the age of 53 must have an adult with them who is not the patient and must remain in the main waiting area with an adult.  If the patient needs to stay at the hospital during part of their recovery, the visitor guidelines for inpatient rooms apply.  Please refer to the [facility] website for the visitor guidelines for any additional information.   If you received a COVID test during your pre-op visit  it is requested that you wear a mask when out in public, stay away from anyone that may not be feeling well and notify your surgeon if you develop symptoms. If you have been in contact with anyone that has tested positive in the last 10 days please notify you surgeon.      Pre-operative CHG Bathing Instructions   You can play a key role in reducing the risk of infection after surgery. Your skin needs to be as free of germs as possible. You can reduce the number of germs on your skin by washing with CHG (chlorhexidine  gluconate) soap before surgery. CHG is an antiseptic soap that kills germs and continues to kill germs even after washing.   DO NOT use if you have an allergy to chlorhexidine /CHG or antibacterial soaps. If your skin becomes reddened or irritated, stop using the CHG and notify one of our RNs at [phone].  TAKE A SHOWER THE NIGHT BEFORE SURGERY   Please keep in mind the following:  DO NOT shave, including legs and underarms, 48 hours prior to surgery.   You may shave your face before/day of surgery.  Place clean sheets on your bed the night before surgery Use a clean washcloth (not used since being washed) for shower. DO NOT sleep with pet's night before surgery.  CHG Shower Instructions:  Wash your face and private area with normal soap. If you choose to wash your hair, wash first with your normal shampoo.  After you use shampoo/soap, rinse your hair and body thoroughly to remove shampoo/soap  residue.  Turn the water OFF and apply half the bottle of CHG soap to a CLEAN washcloth.  Apply CHG soap ONLY FROM YOUR NECK DOWN TO YOUR TOES (washing for 3-5 minutes)  DO NOT use CHG soap on face, private areas, open wounds, or sores.  Pay special attention to the area where your surgery is being performed.  If you are having back surgery, having someone wash your back for you may be helpful. Wait 2 minutes after CHG soap is applied, then you may rinse off the CHG soap.  Pat dry with a clean towel  Put on clean pajamas    Additional instructions for the day of surgery: If you choose, you may shower the morning of surgery with an antibacterial soap.  DO NOT APPLY any lotions, deodorants, cologne, or perfumes.   Do not wear jewelry or makeup Do not wear nail polish, gel polish, artificial nails, or any other type of covering on natural nails (fingers and toes) Do not bring valuables to the hospital. [facility] is not responsible for valuables/personal belongings. Put on clean/comfortable clothes.  Please brush your teeth.  Ask your nurse before applying any prescription medications to the skin.

## 2024-01-16 ENCOUNTER — Encounter (HOSPITAL_COMMUNITY): Payer: Self-pay | Admitting: Urology

## 2024-01-16 ENCOUNTER — Inpatient Hospital Stay (HOSPITAL_COMMUNITY)
Admission: RE | Admit: 2024-01-16 | Discharge: 2024-01-16 | Attending: Thoracic Surgery (Cardiothoracic Vascular Surgery)

## 2024-01-16 ENCOUNTER — Other Ambulatory Visit: Payer: Self-pay

## 2024-01-16 ENCOUNTER — Ambulatory Visit (HOSPITAL_COMMUNITY)
Admission: RE | Admit: 2024-01-16 | Discharge: 2024-01-16 | Disposition: A | Discharge status: Home / Self Care | Source: Ambulatory Visit | Attending: Thoracic Surgery (Cardiothoracic Vascular Surgery) | Admitting: Thoracic Surgery (Cardiothoracic Vascular Surgery)

## 2024-01-16 VITALS — BP 169/88 | HR 113 | Temp 97.7°F | Resp 18 | Ht 66.0 in | Wt 215.4 lb

## 2024-01-16 DIAGNOSIS — Z5181 Encounter for therapeutic drug level monitoring: Secondary | ICD-10-CM

## 2024-01-16 DIAGNOSIS — R911 Solitary pulmonary nodule: Secondary | ICD-10-CM | POA: Insufficient documentation

## 2024-01-16 DIAGNOSIS — Z01818 Encounter for other preprocedural examination: Secondary | ICD-10-CM | POA: Insufficient documentation

## 2024-01-16 DIAGNOSIS — E119 Type 2 diabetes mellitus without complications: Secondary | ICD-10-CM | POA: Insufficient documentation

## 2024-01-16 LAB — URINALYSIS, ROUTINE W REFLEX MICROSCOPIC
Bilirubin Urine: NEGATIVE
Glucose, UA: NEGATIVE mg/dL
Hgb urine dipstick: NEGATIVE
Ketones, ur: NEGATIVE mg/dL
Leukocytes,Ua: NEGATIVE
Nitrite: NEGATIVE
Protein, ur: NEGATIVE mg/dL
Specific Gravity, Urine: 1.003 — ABNORMAL LOW (ref 1.005–1.030)
pH: 6 (ref 5.0–8.0)

## 2024-01-16 LAB — CBC
HCT: 42.6 % (ref 36.0–46.0)
Hemoglobin: 14.6 g/dL (ref 12.0–15.0)
MCH: 30.7 pg (ref 26.0–34.0)
MCHC: 34.3 g/dL (ref 30.0–36.0)
MCV: 89.5 fL (ref 80.0–100.0)
Platelets: 275 K/uL (ref 150–400)
RBC: 4.76 MIL/uL (ref 3.87–5.11)
RDW: 13.7 % (ref 11.5–15.5)
WBC: 7.7 K/uL (ref 4.0–10.5)
nRBC: 0 % (ref 0.0–0.2)

## 2024-01-16 LAB — COMPREHENSIVE METABOLIC PANEL WITH GFR
ALT: 38 U/L (ref 0–44)
AST: 35 U/L (ref 15–41)
Albumin: 4.3 g/dL (ref 3.5–5.0)
Alkaline Phosphatase: 68 U/L (ref 38–126)
Anion gap: 8 (ref 5–15)
BUN: 10 mg/dL (ref 8–23)
CO2: 30 mmol/L (ref 22–32)
Calcium: 9.7 mg/dL (ref 8.9–10.3)
Chloride: 102 mmol/L (ref 98–111)
Creatinine, Ser: 0.72 mg/dL (ref 0.44–1.00)
GFR, Estimated: >60 mL/min (ref >60)
Glucose, Bld: 132 mg/dL — ABNORMAL HIGH (ref 70–99)
Potassium: 3.6 mmol/L (ref 3.5–5.1)
Sodium: 140 mmol/L (ref 135–145)
Total Bilirubin: 0.9 mg/dL (ref 0.0–1.2)
Total Protein: 8 g/dL (ref 6.5–8.1)

## 2024-01-16 LAB — PROTIME-INR
INR: 1 (ref 0.8–1.2)
Prothrombin Time: 14.2 s (ref 11.4–15.2)

## 2024-01-16 LAB — TYPE AND SCREEN
ABO/RH(D): A POS
Antibody Screen: NEGATIVE

## 2024-01-16 LAB — SURGICAL PCR SCREEN
MRSA, PCR: NEGATIVE
Staphylococcus aureus: POSITIVE — AB

## 2024-01-16 LAB — APTT: aPTT: 31 s (ref 24–36)

## 2024-01-16 LAB — HEMOGLOBIN A1C
Hgb A1c MFr Bld: 5.5 % (ref 4.8–5.6)
Mean Plasma Glucose: 111.15 mg/dL

## 2024-01-16 LAB — GLUCOSE, CAPILLARY: Glucose-Capillary: 149 mg/dL — ABNORMAL HIGH (ref 70–99)

## 2024-01-16 NOTE — Progress Notes (Signed)
 PCP - Aldona Pizza, NP Cardiologist - denies Neurologist- Dr. Eather Popp- last note/clearance 01/09/24  PPM/ICD - denies   Chest x-ray - 01/16/2024  EKG - 01/16/2024  Stress Test - denies ECHO - 11/04/23 Cardiac Cath - denies  Sleep Study - denies   Fasting Blood Sugar - A1c drawn today. Pt reports that she checks her CBG weekly- typically 115-120. Pt takes metformin - last dose 01/16/24. Pt was instructed to hold for 2 days prior to surgery. Bernardino Sprang, RN notified of last dose- this is OK for surgery per Bernardino  Last dose of GLP1 agonist-   N/A   Blood Thinner Instructions: n/A Aspirin  Instructions: last dose will be the day before surgery.   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 Pt states that her BP was very elevated when she came for her bronchoscopy in November. She is concerned that it will be elevated again on DOS if she does not take her blood pressure medication DOS at home. Pt states that she takes her BP at home (normally 130-140/70s). Pt instructed to continue monitoring BP, and if BP is elevated at home that morning, she can call pre-op number to verify with anesthesiologist prior to taking her blood pressure medication. She is concerned about need for taking her blood pressure medication as we typically tell patients to hold their losartan  on the DOS. Patient verbalized understanding.   Patient denies shortness of breath, fever, cough and chest pain at PAT appointment   All instructions explained to the patient, with a verbal understanding of the material. Patient agrees to go over the instructions while at home for a better understanding. The opportunity to ask questions was provided.

## 2024-01-16 NOTE — Progress Notes (Signed)
   HOW TO MANAGE YOUR DIABETES BEFORE AND AFTER SURGERY  Why is it important to control my blood sugar before and after surgery? Improving blood sugar levels before and after surgery helps healing and can limit problems. A way of improving blood sugar control is eating a healthy diet by:  Eating less sugar and carbohydrates  Increasing activity/exercise  Talking with your doctor about reaching your blood sugar goals High blood sugars (greater than 180 mg/dL) can raise your risk of infections and slow your recovery, so you will need to focus on controlling your diabetes during the weeks before surgery. Make sure that the doctor who takes care of your diabetes knows about your planned surgery including the date and location.  How do I manage my blood sugar before surgery? Check your blood sugar at least 4 times a day, starting 2 days before surgery, to make sure that the level is not too high or low.  Check your blood sugar the morning of your surgery when you wake up and every 2 hours until you get to the Short Stay unit.  If your blood sugar is less than 70 mg/dL, you will need to treat for low blood sugar: Do not take insulin . Treat a low blood sugar (less than 70 mg/dL) with  cup of clear juice (cranberry or apple), 4 glucose tablets, OR glucose gel. Recheck blood sugar in 15 minutes after treatment (to make sure it is greater than 70 mg/dL). If your blood sugar is not greater than 70 mg/dL on recheck, call [phone] for further instructions. Report your blood sugar to the short stay nurse when you get to Short Stay.  If you are admitted to the hospital after surgery: Your blood sugar will be checked by the staff and you will probably be given insulin  after surgery (instead of oral diabetes medicines) to make sure you have good blood sugar levels. The goal for blood sugar control after surgery is 80-180 mg/dL.

## 2024-01-16 NOTE — Progress Notes (Signed)
 Surgical Instructions   Your procedure is scheduled on Wednesday, December 17th. Report to [facility] Main Entrance A at 9:30 A.M., then check in with the Admitting office. Any questions or running late day of surgery: call [phone]  Questions prior to your surgery date: call [phone], Monday-Friday, 8am-4pm. If you experience any cold or flu symptoms such as cough, fever, chills, shortness of breath, etc. between now and your scheduled surgery, please notify us  at the above number.     Remember:  Do not eat or drink after midnight the night before your surgery. This includes no gum, mints, or hard candy.   Take these medicines the morning of surgery with A SIP OF WATER  pantoprazole  (PROTONIX )    May take these medicines IF NEEDED: ALPRAZolam  (XANAX )    HOLD your aspirin  on day of surgery.  Last dose on 01-17-24, Tuesday.  Per your surgeon's instruction, HOLD your metFORMIN  (GLUCOPHAGE -XR) for 2 day's prior to surgery.  Last dose on Sunday, December 14th.  As of today, HOLD your Omega-3 Fatty Acids (FISH OIL ).     One week prior to surgery, STOP taking any Aleve, Naproxen, Motrin, Goody's, BC's, all herbal medications, fish oil , and non-prescription vitamins. This includes your ibuprofen (ADVIL) .                     Do NOT Smoke (Tobacco/Vaping) for 24 hours prior to your procedure.  If you use a CPAP at night, you may bring your mask/headgear for your overnight stay.   You will be asked to remove any contacts, glasses, piercing's, hearing aid's, dentures/partials prior to surgery. Please bring cases for these items if needed.    Patients discharged the day of surgery will not be allowed to drive home, and someone needs to stay with them for 24 hours.  SURGICAL WAITING ROOM VISITATION Patients may have no more than 2 support people in the waiting area - these visitors may rotate.   Pre-op nurse will coordinate an appropriate time for 1 ADULT support person, who  may not rotate, to accompany patient in pre-op.  Children under the age of 58 must have an adult with them who is not the patient and must remain in the main waiting area with an adult.  If the patient needs to stay at the hospital during part of their recovery, the visitor guidelines for inpatient rooms apply.  Please refer to the [facility] website for the visitor guidelines for any additional information.   If you received a COVID test during your pre-op visit  it is requested that you wear a mask when out in public, stay away from anyone that may not be feeling well and notify your surgeon if you develop symptoms. If you have been in contact with anyone that has tested positive in the last 10 days please notify you surgeon.      Pre-operative CHG Bathing Instructions   You can play a key role in reducing the risk of infection after surgery. Your skin needs to be as free of germs as possible. You can reduce the number of germs on your skin by washing with CHG (chlorhexidine  gluconate) soap before surgery. CHG is an antiseptic soap that kills germs and continues to kill germs even after washing.   DO NOT use if you have an allergy to chlorhexidine /CHG or antibacterial soaps. If your skin becomes reddened or irritated, stop using the CHG and notify one of our RNs at [phone].  TAKE A SHOWER THE NIGHT BEFORE SURGERY   Please keep in mind the following:  DO NOT shave, including legs and underarms, 48 hours prior to surgery.   You may shave your face before/day of surgery.  Place clean sheets on your bed the night before surgery Use a clean washcloth (not used since being washed) for shower. DO NOT sleep with pet's night before surgery.  CHG Shower Instructions:  Wash your face and private area with normal soap. If you choose to wash your hair, wash first with your normal shampoo.  After you use shampoo/soap, rinse your hair and body thoroughly to remove shampoo/soap  residue.  Turn the water OFF and apply half the bottle of CHG soap to a CLEAN washcloth.  Apply CHG soap ONLY FROM YOUR NECK DOWN TO YOUR TOES (washing for 3-5 minutes)  DO NOT use CHG soap on face, private areas, open wounds, or sores.  Pay special attention to the area where your surgery is being performed.  If you are having back surgery, having someone wash your back for you may be helpful. Wait 2 minutes after CHG soap is applied, then you may rinse off the CHG soap.  Pat dry with a clean towel  Put on clean pajamas    Additional instructions for the day of surgery: If you choose, you may shower the morning of surgery with an antibacterial soap.  DO NOT APPLY any lotions, deodorants, cologne, or perfumes.   Do not wear jewelry or makeup Do not wear nail polish, gel polish, artificial nails, or any other type of covering on natural nails (fingers and toes) Do not bring valuables to the hospital. [facility] is not responsible for valuables/personal belongings. Put on clean/comfortable clothes.  Please brush your teeth.  Ask your nurse before applying any prescription medications to the skin.

## 2024-01-17 NOTE — Progress Notes (Signed)
 Anesthesia Chart Review:  Case: [id number] Date/Time: 01/18/24 1121   Procedure: LOBECTOMY, LUNG, ROBOT-ASSISTED, USING VATS (Right: Chest) - ROBOTIC RIGHT UPPER LOBECTOMY   Anesthesia type: General   Diagnosis: Nodule of upper lobe of right lung [R91.1]   Pre-op diagnosis: RUL NODULE   [facility] / [facility]   Surgeons: Kerrin Elspeth BROCKS, MD       DISCUSSION: Patient is a 73 year old female scheduled for the above procedure. S/p video bronchoscopy on 12/12/2023 for suspicious RUL lung nodule. By notes, sampling was limited by bleeding. Cytology noted a few atypical cells along with reactive bronchial cells, no definite malignancy. The degree of hypermetabolic activity suggestive of malignancy, although granulomatous disease and carcinoid tumors also in the differential. She was referred to oncology and Dr. Kerrin. Management options discussed, and above procedure scheduled.    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  She is a retired engineer, civil (consulting).   [place] admission 11/03/2023 - 11/04/2023 for right sided facial and right thumb and forefinger numbness since the night before. BP 211/111. No muscle weakness, visual changes, confusion, facial droop, or dysarthria. She had been moving boxes a few days prior with some cervical pain which could explain finger numbness but not the unilateral facial numbness. Head CT was negative for acute intracranial abnormality, but brain MRI showed a 4 mm acute infarct within the left thalamus as well as evidence of chronic lacunar infarct within the left caudate head.  There was mild to moderate chronic small vessel ischemic changes within the pons. CTA head and neck showed no large vessel occlusion,  mild stenosis of the right supraclinoid ICA and distal M2 and proximal M2 of left MCA, moderate-severe stenosis of the P2 segment of left PCA, and incidental finding of right hilar region mass. Chest CT was subsequently done on 11/04/2023 and showed a 15 mm central RUL pulmonary nodules adjacent to the bronchus with PET/CT and/or tissue sampling for further characterization recommended. Out-patient pulmonology follow-up arranged. In regards to CVA, neurologist Dr. Eather Popp thought infarct likely from small vessel disease. TTE on 11/04/2023 showed vigorous LV systolic functio with turbulent flow through LV/LVOT, hyperdynamic LVEF > 75%, no RWMA, normal RV systolic function, LA/RA normal in size, trivial MR/TR, mild AR. LDL 109 with goal of < 70. He recommended statin therapy (rosuvastatin ) and DAPT x 3 weeks then ASA 81 mg daily along. No therapies needed given minor symptoms. HTN meds: Losartan  50 mg daily and Lasix  20 mg daily.     Dr. Kerrin classified her Zubrod Score as 0: Normal activity, no symptoms.  Due to her recent stroke, Dr. Kerrin did reach out to neurology for preoperative guidance.  She had neurology follow-up with Whitfield Raisin, NP on 01/09/2024.  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  Continue ASA and Crestor . NP added, Advised typically recommend waiting at least 3 months post stroke prior to undergoing elective procedure but due to indication of procedure, I do believe benefit would outweigh potential risk.  Did discuss potential risk of recurrent stroke and patient verbalized understanding. Aspirin  can be continued per patient. Will notify Dr. Kerrin.   A1c 5.5% on 01/16/2024. She is on metformin , last dose 01/16/2024.    Anesthesia team to evaluate on the day of surgery.  Of note, antihypertensive medications include Lasix  20 mg daily and losartan  25-50 mg daily. She reportedly monitors BP at home with readings ~ 130-140/70's, but  when she arrived for bronchoscopy (without taking either), her BP was significantly elevated at 195/94 with post-operative BP 171/85. She did received labetalol  5 mg IV x2 intraoperatively. Subsequent readings during that admission were 146-157/75-86. She expressed concern that her BP will again be high if she did not take at least her ARB on the morning of surgery.  She will check her BP on the morning of surgery and call Holding if significantly elevated so anesthesiologist can be notified for additional recommendations. If thought to be low risk for perioperatively hypotension could consider having her take losartan  at that time, otherwise she could be considered for other class of IV antihypertensive as was given with her last surgery. She was also told she may take Xanax  as prescribed if needed for anxiety.     VS: BP (!) 169/88   Pulse (!) 113   Temp 36.5 C (Oral)   Resp 18   Ht 5' 6 (1.676 m)   Wt 97.7 kg   SpO2 97%   BMI 34.77 kg/m    PROVIDERS: Teresa Aldona CROME, NP is PCP  Catherine Cools, MD is pulmonologist Sherrod Sherrod, MD is HEM-[facility] Rosemarie Rothman, MD is neurologist. Evaluated 11/03/2023 with out-patient follow-up on 01/09/2024 with Whitfield Raisin, NP.   LABS: Labs reviewed: Acceptable for surgery. (all labs ordered are listed, but only abnormal results are displayed)  Labs Reviewed  SURGICAL PCR SCREEN - Abnormal; Notable for the following components:      Result Value   Staphylococcus aureus POSITIVE (*)    All other components within normal limits  GLUCOSE, CAPILLARY - Abnormal; Notable for the following components:   Glucose-Capillary 149 (*)    All other components within normal limits  COMPREHENSIVE METABOLIC PANEL WITH GFR - Abnormal; Notable for the following components:   Glucose, Bld 132 (*)    All other components within normal limits  URINALYSIS, ROUTINE W REFLEX MICROSCOPIC - Abnormal; Notable for the following components:   Color, Urine COLORLESS (*)     Specific Gravity, Urine 1.003 (*)    All other components within normal limits  CBC  PROTIME-INR  APTT  HEMOGLOBIN A1C  TYPE AND SCREEN     IMAGES: CXR 01/16/2024: Report in process.  PET Scan 12/06/2023: IMPRESSION: 1. Right upper lobe pulmonary nodule measuring 1.4 cm with SUV max 7.2, compatible with malignancy. 2. No hypermetabolic adenopathy or distant metastatic disease. 3. Systemic atherosclerosis, including carotid, aortic, and iliac artery calcifications. 4. Diffuse hepatic steatosis. 5. Small type 1 hiatal hernia. 6. Small supraumbilical fat-containing hernia.   NM PET Super D Chest CT 12/06/2023: IMPRESSION: 1. Central right upper lobe 1.6 x 1.0 cm hypermetabolic nodule suspicious for malignancy, with associated right upper lobe airway plugging posteriorly. 2. Aortic and coronary atherosclerosis. 3. Diffuse hepatic steatosis. 4. Thoracic spondylosis. 5. Mild dextroconvex thoracic scoliosis.   CTA Head/Neck 11/03/2023: MPRESSION: 1. No large vessel occlusion. 2. Mild stenosis of the right supraclinoid ICA. 3. Tapering and mild stenosis of the distal M1 segment left MCA and additional mild stenosis of a proximal M2 branch of the left MCA. 4. Moderate to severe stenosis of the P2 segment left PCA. 5. Abnormal soft tissue adjacent to the bronchovascular bundle in the medial right upper lobe just superior to the right hilum, which could reflect a pulmonary nodule/mass versus enlarged lymph nodes. Recommend correlation with  dedicated CT chest.   MRI Brain 11/03/2023: IMPRESSION: 1. 4 mm acute infarct within the left thalamus. 2. Chronic lacunar infarct within the left caudate head. 3. Mild-to-moderate chronic small vessel ischemic changes within the pons.     EKG: 01/16/2024: Sinus tachycardia at 102 bpm Nonspecific ST abnormality  Abnormal ECG  When compared with ECG of 03-Nov-2023 10:05, No significant change was found  Confirmed by Perla Lye  [phone]) on 01/16/2024 10:10:19 PM    CV: Echo 11/04/2023: IMPRESSIONS   1. Vigorous LV systolic function with turbulent flow through LV/LVOT. Left ventricular ejection fraction, by estimation, is >75%. The left  ventricle has hyperdynamic function. The left ventricle has no regional  wall motion abnormalities. Indeterminate  diastolic filling due to E-A fusion.   2. Right ventricular systolic function is normal. The right ventricular  size is normal.   3. The mitral valve is normal in structure. Trivial mitral valve  regurgitation.   4. The aortic valve is tricuspid. Aortic valve regurgitation is mild.   5. The inferior vena cava is normal in size with greater than 50%  respiratory variability, suggesting right atrial pressure of 3 mmHg.  - No atrial level shunt detected by color-flow Doppler.    Past Medical History:  Diagnosis Date   Arthritis    Breast cancer (HCC)    Breast cancer, right breast (HCC) 07/21/2011   Diabetes mellitus without complication (HCC)    GERD (gastroesophageal reflux disease)    Hypertension    Personal history of radiation therapy 2007   Pneumonia 1988   h/o   PONV (postoperative nausea and vomiting)    Primary localized osteoarthritis of right knee 12/26/2017   Stroke [facility])     Past Surgical History:  Procedure Laterality Date   ABDOMINAL HYSTERECTOMY  1994   ANAL FISSURE REPAIR  1999   APPENDECTOMY  2015   BREAST BIOPSY Right 03/05/2005   BREAST LUMPECTOMY Right 03/25/2005   BREAST REDUCTION SURGERY  2011   BRONCHIAL BIOPSY  12/12/2023   Procedure: BRONCHOSCOPY, WITH BIOPSY;  Surgeon: Zaida Zola SAILOR, MD;  Location: [facility];  Service: Pulmonary;;   BRONCHIAL NEEDLE ASPIRATION BIOPSY  12/12/2023   Procedure: BRONCHOSCOPY, WITH NEEDLE ASPIRATION BIOPSY;  Surgeon: Zaida Zola SAILOR, MD;  Location: [facility];  Service: Pulmonary;;   CARPAL TUNNEL RELEASE Bilateral    KNEE ARTHROSCOPY Right    2013,2018   REDUCTION MAMMAPLASTY      TOTAL KNEE ARTHROPLASTY Right 12/26/2017   Procedure: RIGHT TOTAL KNEE ARTHROPLASTY;  Surgeon: Jane Charleston, MD;  Location: [facility];  Service: Orthopedics;  Laterality: Right;   VIDEO BRONCHOSCOPY WITH ENDOBRONCHIAL NAVIGATION Right 12/12/2023   Procedure: VIDEO BRONCHOSCOPY WITH ENDOBRONCHIAL NAVIGATION;  Surgeon: Zaida Zola SAILOR, MD;  [facility];  Service: Pulmonary;  Laterality: Right;   VIDEO BRONCHOSCOPY WITH ENDOBRONCHIAL ULTRASOUND  12/12/2023   Procedure: BRONCHOSCOPY, WITH EBUS;  Surgeon: Zaida Zola SAILOR, MD;  [facility];  Service: Pulmonary;;    MEDICATIONS:  metFORMIN  (GLUCOPHAGE -XR) 500 MG 24 hr tablet   ALPRAZolam  (XANAX ) 0.5 MG tablet   aspirin  EC 81 MG tablet   cholecalciferol (VITAMIN D3) 25 MCG (1000 UNIT) tablet   famotidine  (PEPCID ) 40 MG tablet   furosemide  (LASIX ) 20 MG tablet   ibuprofen (ADVIL) 200 MG tablet   losartan  (COZAAR ) 50 MG tablet   Magnesium  Bisglycinate (MAG GLYCINATE PO)   mometasone (ELOCON) 0.1 % cream   Multiple Vitamin (MULTIVITAMIN) tablet   Omega-3 Fatty Acids (FISH OIL ) 1200 MG CAPS  pantoprazole  (PROTONIX ) 40 MG tablet   Probiotic Product (PROBIOTIC FORMULA PO)   psyllium (REGULOID) 0.52 g capsule   riboflavin (VITAMIN B-2) 100 MG TABS tablet   rosuvastatin  (CRESTOR ) 10 MG tablet   No current facility-administered medications for this encounter.    Isaiah Ruder, PA-C Surgical Short Stay/Anesthesiology [facility] Phone [phone] [place] Phone [phone] 01/17/2024 11:32 AM

## 2024-01-17 NOTE — Anesthesia Preprocedure Evaluation (Signed)
 Anesthesia Evaluation  Patient identified by MRN, date of birth, ID band Patient awake    Reviewed: Allergy & Precautions, NPO status , Patient's Chart, lab work & pertinent test results  History of Anesthesia Complications (+) PONV and history of anesthetic complications  Airway Mallampati: II  TM Distance: <3 FB Neck ROM: Full    Dental  (+) Teeth Intact, Dental Advisory Given   Pulmonary  RUL nodule   breath sounds clear to auscultation       Cardiovascular hypertension, Pt. on medications  Rhythm:Regular Rate:Normal  TTE (2025): 1. Vigorous LV systolic function with turbulent flow through LV/LVOT.SABRA  Left ventricular ejection fraction, by estimation, is >75%. The left  ventricle has hyperdynamic function. The left ventricle has no regional  wall motion abnormalities. Indeterminate  diastolic filling due to E-A fusion.   2. Right ventricular systolic function is normal. The right ventricular  size is normal.   3. The mitral valve is normal in structure. Trivial mitral valve  regurgitation.   4. The aortic valve is tricuspid. Aortic valve regurgitation is mild.   5. The inferior vena cava is normal in size with greater than 50%  respiratory variability, suggesting right atrial pressure of 3 mmHg.     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 s/p DAPT x 3 weeks)), No Residual Symptoms    GI/Hepatic ,GERD  Medicated and Controlled,,  Endo/Other  diabetes, Type 2    Renal/GU Renal disease     Musculoskeletal  (+) Arthritis , Osteoarthritis,    Abdominal   Peds  Hematology Hgb 14.6, Plts 275K (01/16/24)   Anesthesia Other Findings   Reproductive/Obstetrics Hx of Breast Cancer s/p XRT                              Anesthesia Physical Anesthesia Plan  ASA: 3  Anesthesia Plan: General   Post-op Pain Management:    Induction:  Intravenous  PONV Risk Score and Plan: 3 and Ondansetron , Dexamethasone  and Treatment may vary due to age or medical condition  Airway Management Planned: Double Lumen EBT  Additional Equipment: Arterial line  Intra-op Plan:   Post-operative Plan: Extubation in OR  Informed Consent: I have reviewed the patients History and Physical, chart, labs and discussed the procedure including the risks, benefits and alternatives for the proposed anesthesia with the patient or authorized representative who has indicated his/her understanding and acceptance.     Dental advisory given  Plan Discussed with: CRNA  Anesthesia Plan Comments: (PAT note written 01/17/2024 by Francely Craw, PA-C.  )         Anesthesia Quick Evaluation

## 2024-01-18 ENCOUNTER — Encounter (HOSPITAL_COMMUNITY): Payer: Self-pay | Admitting: Thoracic Surgery (Cardiothoracic Vascular Surgery)

## 2024-01-18 ENCOUNTER — Other Ambulatory Visit: Payer: Self-pay

## 2024-01-18 ENCOUNTER — Inpatient Hospital Stay (HOSPITAL_COMMUNITY): Payer: Self-pay | Admitting: Vascular Surgery

## 2024-01-18 ENCOUNTER — Encounter: Admission: RE | Disposition: A | Payer: Self-pay | Attending: Thoracic Surgery (Cardiothoracic Vascular Surgery)

## 2024-01-18 ENCOUNTER — Inpatient Hospital Stay (HOSPITAL_COMMUNITY): Admitting: Anesthesiology

## 2024-01-18 ENCOUNTER — Inpatient Hospital Stay (HOSPITAL_COMMUNITY)
Admission: RE | Admit: 2024-01-18 | Discharge: 2024-01-22 | DRG: 164 | Disposition: A | Discharge status: Home / Self Care | Attending: Thoracic Surgery (Cardiothoracic Vascular Surgery) | Admitting: Thoracic Surgery (Cardiothoracic Vascular Surgery)

## 2024-01-18 ENCOUNTER — Inpatient Hospital Stay (HOSPITAL_COMMUNITY)

## 2024-01-18 DIAGNOSIS — Z8249 Family history of ischemic heart disease and other diseases of the circulatory system: Secondary | ICD-10-CM | POA: Diagnosis not present

## 2024-01-18 DIAGNOSIS — Z9889 Other specified postprocedural states: Principal | ICD-10-CM

## 2024-01-18 DIAGNOSIS — Z90710 Acquired absence of both cervix and uterus: Secondary | ICD-10-CM | POA: Diagnosis not present

## 2024-01-18 DIAGNOSIS — Z96651 Presence of right artificial knee joint: Secondary | ICD-10-CM | POA: Diagnosis present

## 2024-01-18 DIAGNOSIS — Z803 Family history of malignant neoplasm of breast: Secondary | ICD-10-CM

## 2024-01-18 DIAGNOSIS — J439 Emphysema, unspecified: Secondary | ICD-10-CM | POA: Diagnosis present

## 2024-01-18 DIAGNOSIS — J9382 Other air leak: Secondary | ICD-10-CM | POA: Diagnosis present

## 2024-01-18 DIAGNOSIS — Z7984 Long term (current) use of oral hypoglycemic drugs: Secondary | ICD-10-CM | POA: Diagnosis not present

## 2024-01-18 DIAGNOSIS — D72829 Elevated white blood cell count, unspecified: Secondary | ICD-10-CM | POA: Diagnosis present

## 2024-01-18 DIAGNOSIS — Z853 Personal history of malignant neoplasm of breast: Secondary | ICD-10-CM

## 2024-01-18 DIAGNOSIS — Z923 Personal history of irradiation: Secondary | ICD-10-CM | POA: Diagnosis not present

## 2024-01-18 DIAGNOSIS — C3411 Malignant neoplasm of upper lobe, right bronchus or lung: Principal | ICD-10-CM | POA: Diagnosis present

## 2024-01-18 DIAGNOSIS — E119 Type 2 diabetes mellitus without complications: Secondary | ICD-10-CM | POA: Diagnosis present

## 2024-01-18 DIAGNOSIS — R000 Tachycardia, unspecified: Secondary | ICD-10-CM | POA: Diagnosis present

## 2024-01-18 DIAGNOSIS — Z7982 Long term (current) use of aspirin: Secondary | ICD-10-CM

## 2024-01-18 DIAGNOSIS — C3490 Malignant neoplasm of unspecified part of unspecified bronchus or lung: Secondary | ICD-10-CM

## 2024-01-18 DIAGNOSIS — Z79899 Other long term (current) drug therapy: Secondary | ICD-10-CM

## 2024-01-18 DIAGNOSIS — J939 Pneumothorax, unspecified: Secondary | ICD-10-CM | POA: Diagnosis present

## 2024-01-18 DIAGNOSIS — R911 Solitary pulmonary nodule: Secondary | ICD-10-CM | POA: Diagnosis present

## 2024-01-18 DIAGNOSIS — Z823 Family history of stroke: Secondary | ICD-10-CM | POA: Diagnosis not present

## 2024-01-18 DIAGNOSIS — I10 Essential (primary) hypertension: Secondary | ICD-10-CM

## 2024-01-18 DIAGNOSIS — K219 Gastro-esophageal reflux disease without esophagitis: Secondary | ICD-10-CM | POA: Diagnosis present

## 2024-01-18 DIAGNOSIS — F419 Anxiety disorder, unspecified: Secondary | ICD-10-CM | POA: Diagnosis not present

## 2024-01-18 DIAGNOSIS — Z8701 Personal history of pneumonia (recurrent): Secondary | ICD-10-CM | POA: Diagnosis not present

## 2024-01-18 DIAGNOSIS — Z902 Acquired absence of lung [part of]: Secondary | ICD-10-CM

## 2024-01-18 DIAGNOSIS — Z8673 Personal history of transient ischemic attack (TIA), and cerebral infarction without residual deficits: Secondary | ICD-10-CM | POA: Diagnosis not present

## 2024-01-18 HISTORY — PX: LYMPH NODE BIOPSY: SHX201

## 2024-01-18 HISTORY — PX: LOBECTOMY, LUNG, ROBOT-ASSISTED, USING VATS: SHX7607

## 2024-01-18 HISTORY — PX: INTERCOSTAL NERVE BLOCK: SHX5021

## 2024-01-18 LAB — GLUCOSE, CAPILLARY
Glucose-Capillary: 146 mg/dL — ABNORMAL HIGH (ref 70–99)
Glucose-Capillary: 117 mg/dL — ABNORMAL HIGH (ref 70–99)
Glucose-Capillary: 105 mg/dL — ABNORMAL HIGH (ref 70–99)
Glucose-Capillary: 120 mg/dL — ABNORMAL HIGH (ref 70–99)
Glucose-Capillary: 187 mg/dL — ABNORMAL HIGH (ref 70–99)
Glucose-Capillary: 166 mg/dL — ABNORMAL HIGH (ref 70–99)
Glucose-Capillary: 204 mg/dL — ABNORMAL HIGH (ref 70–99)

## 2024-01-18 SURGERY — LOBECTOMY, LUNG, ROBOT-ASSISTED, USING VATS
Anesthesia: General | Site: Chest | Laterality: Right

## 2024-01-18 MED ORDER — CHLORHEXIDINE GLUCONATE 0.12 % MT SOLN
15.0000 mL | Freq: Once | OROMUCOSAL | Status: AC
  Administered 2024-01-18: 10:00:00 15 mL via OROMUCOSAL
  Filled 2024-01-18: qty 15

## 2024-01-18 MED ORDER — PHENYLEPHRINE 80 MCG/ML (10ML) SYRINGE FOR IV PUSH (FOR BLOOD PRESSURE SUPPORT)
PREFILLED_SYRINGE | INTRAVENOUS | Status: DC | PRN
  Administered 2024-01-18 (×3): 80 ug via INTRAVENOUS
  Administered 2024-01-18: 18:00:00 40 ug via INTRAVENOUS
  Administered 2024-01-18: 18:00:00 80 ug via INTRAVENOUS

## 2024-01-18 MED ORDER — MAG GLYCINATE 100 MG PO TABS: 240.0000 mg | ORAL_TABLET | Freq: Every day | ORAL | Status: DC

## 2024-01-18 MED ORDER — CLEVIDIPINE BUTYRATE 0.5 MG/ML IV EMUL
INTRAVENOUS | Status: DC | PRN
  Administered 2024-01-18: 16:00:00 2 mg/h via INTRAVENOUS

## 2024-01-18 MED ORDER — LOSARTAN POTASSIUM 25 MG PO TABS
50.0000 mg | ORAL_TABLET | Freq: Every day | ORAL | Status: DC
  Administered 2024-01-18 – 2024-01-22 (×5): 50 mg via ORAL
  Filled 2024-01-18: qty 2
  Filled 2024-01-18 (×2): qty 1
  Filled 2024-01-18 (×2): qty 2

## 2024-01-18 MED ORDER — FUROSEMIDE 20 MG PO TABS
20.0000 mg | ORAL_TABLET | Freq: Every morning | ORAL | Status: DC
  Administered 2024-01-19 – 2024-01-22 (×4): 20 mg via ORAL
  Filled 2024-01-18 (×4): qty 1

## 2024-01-18 MED ORDER — HYDROMORPHONE HCL 1 MG/ML IJ SOLN
INTRAMUSCULAR | Status: AC
  Filled 2024-01-18: qty 0.5

## 2024-01-18 MED ORDER — PANTOPRAZOLE SODIUM 40 MG PO TBEC
40.0000 mg | DELAYED_RELEASE_TABLET | Freq: Every day | ORAL | Status: DC
  Administered 2024-01-19 – 2024-01-22 (×4): 40 mg via ORAL
  Filled 2024-01-18 (×4): qty 1

## 2024-01-18 MED ORDER — LACTATED RINGERS IV SOLN: INTRAVENOUS | Status: DC | PRN

## 2024-01-18 MED ORDER — ONDANSETRON HCL 4 MG/2ML IJ SOLN: 4.0000 mg | Freq: Four times a day (QID) | INTRAMUSCULAR | Status: DC | PRN

## 2024-01-18 MED ORDER — BISACODYL 5 MG PO TBEC
10.0000 mg | DELAYED_RELEASE_TABLET | Freq: Every day | ORAL | Status: DC
  Administered 2024-01-18 – 2024-01-22 (×5): 10 mg via ORAL
  Filled 2024-01-18 (×5): qty 2

## 2024-01-18 MED ORDER — ONDANSETRON HCL 4 MG/2ML IJ SOLN
INTRAMUSCULAR | Status: DC | PRN
  Administered 2024-01-18: 17:00:00 4 mg via INTRAVENOUS

## 2024-01-18 MED ORDER — BUPIVACAINE HCL (PF) 0.5 % IJ SOLN
INTRAMUSCULAR | Status: AC
  Filled 2024-01-18: qty 30

## 2024-01-18 MED ORDER — GABAPENTIN 300 MG PO CAPS
300.0000 mg | ORAL_CAPSULE | Freq: Every day | ORAL | Status: AC
  Administered 2024-01-18 – 2024-01-20 (×3): 300 mg via ORAL
  Filled 2024-01-18 (×3): qty 1

## 2024-01-18 MED ORDER — INSULIN ASPART 100 UNIT/ML IJ SOLN
INTRAMUSCULAR | Status: AC
  Filled 2024-01-18: qty 10

## 2024-01-18 MED ORDER — CEFAZOLIN SODIUM-DEXTROSE 2-4 GM/100ML-% IV SOLN
2.0000 g | INTRAVENOUS | Status: AC
  Administered 2024-01-18: 15:00:00 2 g via INTRAVENOUS
  Filled 2024-01-18: qty 100

## 2024-01-18 MED ORDER — SUGAMMADEX SODIUM 200 MG/2ML IV SOLN
INTRAVENOUS | Status: DC | PRN
  Administered 2024-01-18: 19:00:00 390 mg via INTRAVENOUS

## 2024-01-18 MED ORDER — INSULIN REGULAR(HUMAN) IN NACL 100-0.9 UT/100ML-% IV SOLN
INTRAVENOUS | Status: AC
  Filled 2024-01-18: qty 100

## 2024-01-18 MED ORDER — FENTANYL CITRATE (PF) 100 MCG/2ML IJ SOLN
25.0000 ug | INTRAMUSCULAR | Status: DC | PRN
  Administered 2024-01-18: 20:00:00 25 ug via INTRAVENOUS
  Administered 2024-01-18: 20:00:00 50 ug via INTRAVENOUS
  Administered 2024-01-18: 20:00:00 25 ug via INTRAVENOUS

## 2024-01-18 MED ORDER — 0.9 % SODIUM CHLORIDE (POUR BTL) OPTIME
TOPICAL | Status: DC | PRN
  Administered 2024-01-18: 14:00:00 2000 mL

## 2024-01-18 MED ORDER — KETOROLAC TROMETHAMINE 15 MG/ML IJ SOLN
15.0000 mg | Freq: Four times a day (QID) | INTRAMUSCULAR | Status: AC
  Administered 2024-01-18 – 2024-01-20 (×7): 15 mg via INTRAVENOUS
  Filled 2024-01-18 (×7): qty 1

## 2024-01-18 MED ORDER — OXYCODONE HCL 5 MG PO TABS: 5.0000 mg | ORAL_TABLET | Freq: Once | ORAL | Status: DC | PRN

## 2024-01-18 MED ORDER — ASPIRIN 81 MG PO TBEC
81.0000 mg | DELAYED_RELEASE_TABLET | Freq: Every day | ORAL | Status: DC
  Administered 2024-01-19 – 2024-01-22 (×4): 81 mg via ORAL
  Filled 2024-01-18 (×4): qty 1

## 2024-01-18 MED ORDER — MAGNESIUM OXIDE -MG SUPPLEMENT 400 (240 MG) MG PO TABS
200.0000 mg | ORAL_TABLET | Freq: Every day | ORAL | Status: DC
  Administered 2024-01-18 – 2024-01-21 (×4): 200 mg via ORAL
  Filled 2024-01-18 (×4): qty 1

## 2024-01-18 MED ORDER — SODIUM CHLORIDE 0.45 % IV SOLN: INTRAVENOUS | Status: DC

## 2024-01-18 MED ORDER — TRAMADOL HCL 50 MG PO TABS: 50.0000 mg | ORAL_TABLET | Freq: Four times a day (QID) | ORAL | Status: DC | PRN

## 2024-01-18 MED ORDER — ONDANSETRON HCL 4 MG/2ML IJ SOLN: 4.0000 mg | Freq: Once | INTRAMUSCULAR | Status: DC | PRN

## 2024-01-18 MED ORDER — PANTOPRAZOLE SODIUM 40 MG PO TBEC: 40.0000 mg | DELAYED_RELEASE_TABLET | Freq: Every morning | ORAL | Status: DC

## 2024-01-18 MED ORDER — INSULIN ASPART 100 UNIT/ML IJ SOLN
0.0000 [IU] | INTRAMUSCULAR | Status: DC | PRN
  Administered 2024-01-18: 19:00:00 2 [IU] via SUBCUTANEOUS

## 2024-01-18 MED ORDER — BUPIVACAINE LIPOSOME 1.3 % IJ SUSP
INTRAMUSCULAR | Status: AC
  Filled 2024-01-18: qty 20

## 2024-01-18 MED ORDER — INSULIN ASPART 100 UNIT/ML IJ SOLN
0.0000 [IU] | INTRAMUSCULAR | Status: DC
  Administered 2024-01-18 – 2024-01-19 (×4): 4 [IU] via SUBCUTANEOUS
  Filled 2024-01-18 (×4): qty 4

## 2024-01-18 MED ORDER — GABAPENTIN 300 MG PO CAPS
300.0000 mg | ORAL_CAPSULE | Freq: Two times a day (BID) | ORAL | Status: DC
  Administered 2024-01-21 – 2024-01-22 (×2): 300 mg via ORAL
  Filled 2024-01-18 (×2): qty 1

## 2024-01-18 MED ORDER — CEFAZOLIN SODIUM-DEXTROSE 2-4 GM/100ML-% IV SOLN
2.0000 g | Freq: Three times a day (TID) | INTRAVENOUS | Status: AC
  Administered 2024-01-18 – 2024-01-19 (×2): 2 g via INTRAVENOUS
  Filled 2024-01-18 (×2): qty 100

## 2024-01-18 MED ORDER — HEMOSTATIC AGENTS (NO CHARGE) OPTIME
TOPICAL | Status: DC | PRN
  Administered 2024-01-18: 14:00:00 2 via TOPICAL

## 2024-01-18 MED ORDER — SENNOSIDES-DOCUSATE SODIUM 8.6-50 MG PO TABS
1.0000 | ORAL_TABLET | Freq: Every day | ORAL | Status: DC
  Administered 2024-01-19 – 2024-01-21 (×3): 1 via ORAL
  Filled 2024-01-18 (×4): qty 1

## 2024-01-18 MED ORDER — BUPIVACAINE HCL (PF) 0.5 % IJ SOLN
INTRAMUSCULAR | Status: DC | PRN
  Administered 2024-01-18: 14:00:00 100 mL

## 2024-01-18 MED ORDER — OXYCODONE HCL 5 MG PO TABS
5.0000 mg | ORAL_TABLET | ORAL | Status: DC | PRN
  Administered 2024-01-18 – 2024-01-22 (×9): 10 mg via ORAL
  Filled 2024-01-18 (×10): qty 2

## 2024-01-18 MED ORDER — ROCURONIUM BROMIDE 10 MG/ML (PF) SYRINGE
PREFILLED_SYRINGE | INTRAVENOUS | Status: DC | PRN
  Administered 2024-01-18: 14:00:00 80 mg via INTRAVENOUS
  Administered 2024-01-18: 16:00:00 20 mg via INTRAVENOUS
  Administered 2024-01-18: 17:00:00 10 mg via INTRAVENOUS
  Administered 2024-01-18: 16:00:00 20 mg via INTRAVENOUS
  Administered 2024-01-18: 17:00:00 30 mg via INTRAVENOUS
  Administered 2024-01-18: 17:00:00 10 mg via INTRAVENOUS

## 2024-01-18 MED ORDER — SODIUM CHLORIDE (PF) 0.9 % IJ SOLN
INTRAMUSCULAR | Status: AC
  Filled 2024-01-18: qty 50

## 2024-01-18 MED ORDER — ROSUVASTATIN CALCIUM 5 MG PO TABS
10.0000 mg | ORAL_TABLET | Freq: Every day | ORAL | Status: DC
  Administered 2024-01-18 – 2024-01-21 (×4): 10 mg via ORAL
  Filled 2024-01-18 (×4): qty 2

## 2024-01-18 MED ORDER — ACETAMINOPHEN 160 MG/5ML PO SOLN: 1000.0000 mg | Freq: Four times a day (QID) | ORAL | Status: DC

## 2024-01-18 MED ORDER — LABETALOL HCL 5 MG/ML IV SOLN
INTRAVENOUS | Status: DC | PRN
  Administered 2024-01-18: 19:00:00 5 mg via INTRAVENOUS

## 2024-01-18 MED ORDER — PROPOFOL 10 MG/ML IV BOLUS
INTRAVENOUS | Status: AC
  Filled 2024-01-18: qty 20

## 2024-01-18 MED ORDER — ACETAMINOPHEN 500 MG PO TABS
1000.0000 mg | ORAL_TABLET | Freq: Four times a day (QID) | ORAL | Status: DC
  Administered 2024-01-18 – 2024-01-22 (×12): 1000 mg via ORAL
  Filled 2024-01-18 (×12): qty 2

## 2024-01-18 MED ORDER — MORPHINE SULFATE (PF) 2 MG/ML IV SOLN
2.0000 mg | INTRAVENOUS | Status: DC | PRN
  Administered 2024-01-19: 19:00:00 2 mg via INTRAVENOUS
  Filled 2024-01-18: qty 1

## 2024-01-18 MED ORDER — ALPRAZOLAM 0.25 MG PO TABS: 0.2500 mg | ORAL_TABLET | Freq: Every day | ORAL | Status: DC | PRN

## 2024-01-18 MED ORDER — OXYCODONE HCL 5 MG/5ML PO SOLN: 5.0000 mg | Freq: Once | ORAL | Status: DC | PRN

## 2024-01-18 MED ORDER — ACETAMINOPHEN 10 MG/ML IV SOLN: 1000.0000 mg | Freq: Once | INTRAVENOUS | Status: DC | PRN

## 2024-01-18 MED ORDER — ALBUMIN HUMAN 5 % IV SOLN: INTRAVENOUS | Status: DC | PRN

## 2024-01-18 MED ORDER — LACTATED RINGERS IV SOLN: INTRAVENOUS | Status: DC

## 2024-01-18 MED ORDER — FENTANYL CITRATE (PF) 100 MCG/2ML IJ SOLN
INTRAMUSCULAR | Status: AC
  Filled 2024-01-18: qty 2

## 2024-01-18 MED ORDER — ENOXAPARIN SODIUM 40 MG/0.4ML IJ SOSY
40.0000 mg | PREFILLED_SYRINGE | Freq: Every day | INTRAMUSCULAR | Status: DC
  Administered 2024-01-19 – 2024-01-21 (×3): 40 mg via SUBCUTANEOUS
  Filled 2024-01-18 (×4): qty 0.4

## 2024-01-18 MED ORDER — DEXMEDETOMIDINE HCL IN NACL 80 MCG/20ML IV SOLN
INTRAVENOUS | Status: DC | PRN
  Administered 2024-01-18 (×2): 4 ug via INTRAVENOUS

## 2024-01-18 MED ORDER — HYDRALAZINE HCL 20 MG/ML IJ SOLN
10.0000 mg | Freq: Four times a day (QID) | INTRAMUSCULAR | Status: DC | PRN
  Administered 2024-01-21: 10 mg via INTRAVENOUS
  Filled 2024-01-18: qty 1

## 2024-01-18 MED ORDER — FAMOTIDINE 20 MG PO TABS
40.0000 mg | ORAL_TABLET | Freq: Every evening | ORAL | Status: DC
  Administered 2024-01-18 – 2024-01-21 (×4): 40 mg via ORAL
  Filled 2024-01-18 (×5): qty 2

## 2024-01-18 MED ORDER — DEXAMETHASONE SOD PHOSPHATE PF 10 MG/ML IJ SOLN
INTRAMUSCULAR | Status: DC | PRN
  Administered 2024-01-18: 15:00:00 5 mg via INTRAVENOUS

## 2024-01-18 MED ORDER — PROPOFOL 10 MG/ML IV BOLUS
INTRAVENOUS | Status: DC | PRN
  Administered 2024-01-18: 14:00:00 30 mg via INTRAVENOUS
  Administered 2024-01-18: 14:00:00 100 mg via INTRAVENOUS
  Administered 2024-01-18 (×2): 50 mg via INTRAVENOUS
  Administered 2024-01-18: 14:00:00 70 mg via INTRAVENOUS

## 2024-01-18 MED ORDER — HYDROMORPHONE HCL 1 MG/ML IJ SOLN
INTRAMUSCULAR | Status: DC | PRN
  Administered 2024-01-18: 18:00:00 .5 mg via INTRAVENOUS

## 2024-01-18 MED ORDER — AMISULPRIDE (ANTIEMETIC) 5 MG/2ML IV SOLN: 10.0000 mg | Freq: Once | INTRAVENOUS | Status: DC | PRN

## 2024-01-18 MED ORDER — FENTANYL CITRATE (PF) 250 MCG/5ML IJ SOLN
INTRAMUSCULAR | Status: AC
  Filled 2024-01-18: qty 5

## 2024-01-18 MED ORDER — PHENYLEPHRINE HCL-NACL 20-0.9 MG/250ML-% IV SOLN
INTRAVENOUS | Status: DC | PRN
  Administered 2024-01-18: 14:00:00 50 ug/min via INTRAVENOUS

## 2024-01-18 MED ORDER — SODIUM CHLORIDE 0.9 % IR SOLN
Status: DC | PRN
  Administered 2024-01-18: 16:00:00 2000 mL

## 2024-01-18 MED ORDER — ORAL CARE MOUTH RINSE: 15.0000 mL | Freq: Once | OROMUCOSAL | Status: AC

## 2024-01-18 MED ADMIN — Fentanyl Citrate Preservative Free (PF) Inj 250 MCG/5ML: 100 ug | INTRAVENOUS | @ 14:00:00 | NDC 72572017125

## 2024-01-18 MED ADMIN — Fentanyl Citrate Preservative Free (PF) Inj 250 MCG/5ML: 50 ug | INTRAVENOUS | @ 15:00:00 | NDC 72572017125

## 2024-01-18 MED ADMIN — Lidocaine HCl Local Soln Prefilled Syringe 100 MG/5ML (2%): 100 mg | INTRAVENOUS | @ 14:00:00 | NDC 70004072309

## 2024-01-18 MED ADMIN — Fentanyl Citrate Preservative Free (PF) Inj 250 MCG/5ML: 50 ug | INTRAVENOUS | @ 14:00:00 | NDC 72572017125

## 2024-01-18 MED FILL — Propofol IV Emul 200 MG/20ML (10 MG/ML): INTRAVENOUS | Qty: 20 | Status: AC

## 2024-01-18 SURGICAL SUPPLY — 72 items
CANISTER SUCTION 3000ML PPV (SUCTIONS) ×4 IMPLANT
CANNULA REDUCER 12-8 DVNC XI (CANNULA) ×4 IMPLANT
CNTNR URN SCR LID CUP LEK RST (MISCELLANEOUS) ×10 IMPLANT
CONN ST 1/4X3/8 BEN (MISCELLANEOUS) IMPLANT
COVER TIP SHEARS 8 DVNC (MISCELLANEOUS) IMPLANT
DEFOGGER SCOPE WARM SEASHARP (MISCELLANEOUS) ×2 IMPLANT
DERMABOND ADVANCED .7 DNX12 (GAUZE/BANDAGES/DRESSINGS) ×2 IMPLANT
DRAIN CHANNEL 28F RND 3/8 FF (WOUND CARE) IMPLANT
DRAPE ARM DVNC X/XI (DISPOSABLE) ×8 IMPLANT
DRAPE COLUMN DVNC XI (DISPOSABLE) ×2 IMPLANT
DRAPE CV SPLIT W-CLR ANES SCRN (DRAPES) ×2 IMPLANT
DRAPE HALF SHEET 40X57 (DRAPES) IMPLANT
DRAPE INCISE IOBAN 66X45 STRL (DRAPES) IMPLANT
DRAPE SURG ORHT 6 SPLT 77X108 (DRAPES) ×2 IMPLANT
DRIVER NDL MEGA SUTCUT DVNCXI (INSTRUMENTS) IMPLANT
DRIVER NDLE MEGA SUTCUT DVNCXI (INSTRUMENTS) IMPLANT
ELECT BLADE 6.5 EXT (BLADE) ×2 IMPLANT
ELECTRODE REM PT RTRN 9FT ADLT (ELECTROSURGICAL) ×2 IMPLANT
FORCEPS BPLR FENES DVNC XI (FORCEP) IMPLANT
FORCEPS BPLR LNG DVNC XI (INSTRUMENTS) IMPLANT
GAUZE KITTNER 4X5 RF (MISCELLANEOUS) ×4 IMPLANT
GAUZE SPONGE 4X4 12PLY STRL (GAUZE/BANDAGES/DRESSINGS) ×2 IMPLANT
GAUZE SPONGE 4X4 12PLY STRL LF (GAUZE/BANDAGES/DRESSINGS) IMPLANT
GLOVE SS BIOGEL STRL SZ 7.5 (GLOVE) ×6 IMPLANT
GLOVE SURG POLYISO LF SZ8 (GLOVE) ×2 IMPLANT
GOWN STRL REUS W/ TWL LRG LVL3 (GOWN DISPOSABLE) ×4 IMPLANT
GOWN STRL REUS W/ TWL XL LVL3 (GOWN DISPOSABLE) ×4 IMPLANT
GOWN STRL REUS W/TWL 2XL LVL3 (GOWN DISPOSABLE) ×2 IMPLANT
GRASPER TIP-UP FEN DVNC XI (INSTRUMENTS) IMPLANT
HEMOSTAT SURGICEL 2X14 (HEMOSTASIS) ×6 IMPLANT
IRRIGATION STRYKERFLOW (MISCELLANEOUS) ×2 IMPLANT
IRRIGATOR SUCT 8 DISP DVNC XI (IRRIGATION / IRRIGATOR) IMPLANT
KIT BASIN OR (CUSTOM PROCEDURE TRAY) ×2 IMPLANT
KIT TURNOVER KIT B (KITS) ×2 IMPLANT
NDL HYPO 25GX1X1/2 BEV (NEEDLE) ×2 IMPLANT
NDL SPNL 22GX3.5 QUINCKE BK (NEEDLE) ×2 IMPLANT
NEEDLE HYPO 25GX1X1/2 BEV (NEEDLE) ×2 IMPLANT
NEEDLE SPNL 22GX3.5 QUINCKE BK (NEEDLE) ×2 IMPLANT
PACK CHEST (CUSTOM PROCEDURE TRAY) ×2 IMPLANT
PAD ARMBOARD POSITIONER FOAM (MISCELLANEOUS) ×4 IMPLANT
PORT ACCESS TROCAR AIRSEAL 12 (TROCAR) ×2 IMPLANT
RELOAD STAPLE 45 2.5 WHT DVNC (STAPLE) IMPLANT
RELOAD STAPLE 45 3.5 BLU DVNC (STAPLE) IMPLANT
RELOAD STAPLE 45 4.3 GRN DVNC (STAPLE) IMPLANT
SCISSORS MNPLR CVD DVNC XI (INSTRUMENTS) IMPLANT
SEAL UNIV 5-12 XI (MISCELLANEOUS) ×8 IMPLANT
SEALER SYNCHRO 8 IS4000 DVNC (MISCELLANEOUS) IMPLANT
SET TRI-LUMEN FLTR TB AIRSEAL (TUBING) ×2 IMPLANT
SOLN 0.9% NACL POUR BTL 1000ML (IV SOLUTION) ×4 IMPLANT
SOLN STERILE WATER BTL 1000 ML (IV SOLUTION) ×4 IMPLANT
SOLUTION ELECTROSURG ANTI STCK (MISCELLANEOUS) ×2 IMPLANT
SPONGE TONSIL 1 RF SGL (DISPOSABLE) IMPLANT
STAPLER 45 SUREFORM CVD DVNC (STAPLE) IMPLANT
SUT PROLENE 4-0 RB1 .5 CRCL 36 (SUTURE) IMPLANT
SUT SILK 1 MH (SUTURE) ×2 IMPLANT
SUT SILK 2 0 SH (SUTURE) ×2 IMPLANT
SUT STRATA 2-0 15 CT-1.5 (SUTURE) IMPLANT
SUT VIC AB 1 CTX36XBRD ANBCTR (SUTURE) ×2 IMPLANT
SUT VIC AB 2-0 CTX 36 (SUTURE) ×2 IMPLANT
SUT VIC AB 2-0 SH 27X BRD (SUTURE) IMPLANT
SUT VIC AB 3-0 SH 27X BRD (SUTURE) IMPLANT
SUT VIC AB 3-0 X1 27 (SUTURE) ×4 IMPLANT
SUT VICRYL 0 TIES 12 18 (SUTURE) ×2 IMPLANT
SUT VICRYL 0 UR6 27IN ABS (SUTURE) ×4 IMPLANT
SUTURE STRAT PDS 2-0 30 CT-1.5 (SUTURE) IMPLANT
SYR 20CC LL (SYRINGE) ×4 IMPLANT
SYSTEM RETRIEVAL ANCHOR 15 (MISCELLANEOUS) IMPLANT
SYSTEM SAHARA CHEST DRAIN ATS (WOUND CARE) ×2 IMPLANT
TAPE CLOTH 4X10 WHT NS (GAUZE/BANDAGES/DRESSINGS) ×2 IMPLANT
TAPE CLOTH SURG 4X10 WHT LF (GAUZE/BANDAGES/DRESSINGS) IMPLANT
TOWEL GREEN STERILE (TOWEL DISPOSABLE) ×2 IMPLANT
TRAY FOLEY MTR SLVR 16FR STAT (SET/KITS/TRAYS/PACK) ×2 IMPLANT

## 2024-01-18 NOTE — Anesthesia Postprocedure Evaluation (Signed)
 Anesthesia Post Note  Patient: [name]  Procedure(s) Performed: LOBECTOMY, LUNG, ROBOT-ASSISTED, USING VATS (Right: Chest) BLOCK, NERVE, INTERCOSTAL (Right: Chest) LYMPH NODE BIOPSY (Right)     Patient location during evaluation: PACU Anesthesia Type: General Level of consciousness: awake and alert Pain management: pain level controlled Vital Signs Assessment: post-procedure vital signs reviewed and stable Respiratory status: spontaneous breathing, nonlabored ventilation, respiratory function stable and patient connected to nasal cannula oxygen Cardiovascular status: blood pressure returned to baseline, stable and tachycardic Postop Assessment: no apparent nausea or vomiting Anesthetic complications: no   No notable events documented.  Last Vitals:  Vitals:   01/18/24 2030 01/18/24 2043  BP: (!) 166/81 (!) 149/77  Pulse: 99 (!) 101  Resp: 20 20  Temp: 36.8 C 36.6 C  SpO2: 96% 97%    Last Pain:  Vitals:   01/18/24 2043  TempSrc: Oral  PainSc:                  [name]

## 2024-01-18 NOTE — Interval H&P Note (Signed)
 History and Physical Interval Note:  01/18/2024 12:46 PM  [name]  has presented today for surgery, with the diagnosis of RUL NODULE.  The various methods of treatment have been discussed with the patient and family. After consideration of risks, benefits and other options for treatment, the patient has consented to  Procedures with comments: LOBECTOMY, LUNG, ROBOT-ASSISTED, USING VATS (Right) - ROBOTIC RIGHT UPPER LOBECTOMY as a surgical intervention.  The patient's history has been reviewed, patient examined, no change in status, stable for surgery.  I have reviewed the patient's chart and labs.  Questions were answered to the patient's satisfaction.     [name]

## 2024-01-18 NOTE — Hospital Course (Addendum)
 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   She presented on 11/03/2023 with right sided facial numbness and right finger numbness.  MR showed a subcentimeter left thalamic stroke.  She had a workup for that it was noted to have a right upper lobe lung nodule.  CT of the chest showed a 1.4 cm nodule centrally in the right upper lobe.  She underwent a robotic bronchoscopy.  Sampling was limited by bleeding.  Cytology noted a few atypical cells along with reactive bronchial cells.  No definite malignancy.  She was referred to [facility] and evaluated by Dr. Kerrin at which time she admitted to being a life long non-smoker.  She denied chest pain/tightness, shortness of breath, cough and wheezing.  She denied weight loss.  PET CT scan was reviewed and revealed the nodule to be hypermetabolic and very likely to be a malignancy.  It was recommended she undergo surgical resection via Right upper lobectomy.  The risks and benefits to the procedure were explained to the patient and she was agreeable to proceed.  Hospital Course:  [name] presented to [facility] on 01/18/2024.  She was taken to the operating room and underwent Robotic Assisted Right Video Thoracoscopy with Right Upper Lobectomy, Intercostal Nerve Block, Lymph node dissection.  She tolerated the procedure without difficulty, was extubated, and taken to the PACU in stable condition.

## 2024-01-18 NOTE — Brief Op Note (Signed)
 01/18/2024  6:46 PM  PATIENT:  [name]  73 y.o. female  PRE-OPERATIVE DIAGNOSIS:  RUL NODULE  POST-OPERATIVE DIAGNOSIS:  RUL NODULE  PROCEDURE:  Procedures with comments:  ROBOTIC ASSISTED VIDEO THORACOSCOPY -RIGHT UPPER LOBECTOMY  BLOCK, NERVE, INTERCOSTAL (Right)  LYMPH NODE BIOPSY (Right)  SURGEON:  Surgeons and Role:    * Kerrin Elspeth BROCKS, MD - Primary  PHYSICIAN ASSISTANT: Con Bend PA-C, Janney Priego PA-C  ASSISTANTS: none   ANESTHESIA:   general  EBL: Per Anesthesia Record  BLOOD ADMINISTERED:none  DRAINS: 28 Straight Chest Tube   LOCAL MEDICATIONS USED:  BUPIVICAINE   SPECIMEN:  Source of Specimen:  Right upper lobectomy, lymph nodes,   DISPOSITION OF SPECIMEN:  PATHOLOGY  COUNTS:  YES  TOURNIQUET:  * No tourniquets in log *  DICTATION: .Dragon Dictation  PLAN OF CARE: Admit to inpatient   PATIENT DISPOSITION:  PACU - hemodynamically stable.   Delay start of Pharmacological VTE agent (>24hrs) due to surgical blood loss or risk of bleeding: no

## 2024-01-18 NOTE — Anesthesia Procedure Notes (Signed)
 Procedure Name: Intubation Date/Time: 01/18/2024 2:10 PM  Performed by: Arvell Edsel HERO, CRNAPre-anesthesia Checklist: Patient identified, Emergency Drugs available, Suction available, Patient being monitored and Timeout performed Patient Re-evaluated:Patient Re-evaluated prior to induction Oxygen Delivery Method: Circle system utilized Preoxygenation: Pre-oxygenation with 100% oxygen Induction Type: IV induction Ventilation: Mask ventilation without difficulty and Oral airway inserted - appropriate to patient size Laryngoscope Size: Glidescope and 3 Grade View: Grade I Tube type: Oral Endobronchial tube: Bronchial Blocker placed under direct vision Tube size: 8.0 mm Number of attempts: 1 Airway Equipment and Method: Video-laryngoscopy and Patient positioned with wedge pillow Placement Confirmation: positive ETCO2 and breath sounds checked- equal and bilateral Tube secured with: Tape Dental Injury: Teeth and Oropharynx as per pre-operative assessment  Comments: Easy mask ventilation. Unable to place double lumen tube. Glidescope 3 used for grade 1 view. 8.0 ETT placed with bronchial blocker

## 2024-01-18 NOTE — Transfer of Care (Signed)
 Immediate Anesthesia Transfer of Care Note  Patient: [name]  Procedure(s) Performed: LOBECTOMY, LUNG, ROBOT-ASSISTED, USING VATS (Right: Chest) BLOCK, NERVE, INTERCOSTAL (Right: Chest) LYMPH NODE BIOPSY (Right)  Patient Location: PACU  Anesthesia Type:General  Level of Consciousness: awake, oriented, and drowsy  Airway & Oxygen Therapy: Patient Spontanous Breathing and Patient connected to face mask oxygen  Post-op Assessment: Report given to RN, Post -op Vital signs reviewed and stable, and Patient moving all extremities  Post vital signs: Reviewed and stable  Last Vitals:  Vitals Value Taken Time  BP 152/69 01/18/24 19:02  Temp    Pulse 92 01/18/24 19:08  Resp 13 01/18/24 19:08  SpO2 100 % 01/18/24 19:08  Vitals shown include unfiled device data.  Last Pain:  Vitals:   01/18/24 1327  TempSrc: Oral  PainSc:          Complications: No notable events documented.

## 2024-01-18 NOTE — Anesthesia Procedure Notes (Signed)
 Arterial Line Insertion Start/End12/17/2025 10:05 AM, 01/18/2024 10:18 AM Performed by: Dene Lauraine DASEN, MD, [facility] [place], Lonni PARAS, CRNA, CRNA  Patient location: Pre-op. Preanesthetic checklist: patient identified, IV checked, site marked, risks and benefits discussed, surgical consent, monitors and equipment checked, pre-op evaluation, timeout performed and anesthesia consent Lidocaine  1% used for infiltration Left, radial was placed Catheter size: 20 G Hand hygiene performed  and Seldinger technique used Allen's test indicative of satisfactory collateral circulation Attempts: 2 Procedure performed using ultrasound to evaluate access site. Ultrasound Notes:relevant anatomy identified, ultrasound used to visualize needle entry and vessel patent under ultrasound. Following insertion, dressing applied (CHG infused dressing). Post procedure complications: unsuccessful attempts. Patient tolerated the procedure well with no immediate complications.

## 2024-01-19 ENCOUNTER — Encounter (HOSPITAL_COMMUNITY): Payer: Self-pay | Admitting: Thoracic Surgery (Cardiothoracic Vascular Surgery)

## 2024-01-19 ENCOUNTER — Inpatient Hospital Stay (HOSPITAL_COMMUNITY)

## 2024-01-19 LAB — CBC
HCT: 34.1 % — ABNORMAL LOW (ref 36.0–46.0)
Hemoglobin: 11.5 g/dL — ABNORMAL LOW (ref 12.0–15.0)
MCH: 30.4 pg (ref 26.0–34.0)
MCHC: 33.7 g/dL (ref 30.0–36.0)
MCV: 90.2 fL (ref 80.0–100.0)
Platelets: 211 K/uL (ref 150–400)
RBC: 3.78 MIL/uL — ABNORMAL LOW (ref 3.87–5.11)
RDW: 13.6 % (ref 11.5–15.5)
WBC: 12 K/uL — ABNORMAL HIGH (ref 4.0–10.5)
nRBC: 0 % (ref 0.0–0.2)

## 2024-01-19 LAB — BASIC METABOLIC PANEL WITH GFR
Anion gap: 10 (ref 5–15)
BUN: 10 mg/dL (ref 8–23)
CO2: 27 mmol/L (ref 22–32)
Calcium: 9.4 mg/dL (ref 8.9–10.3)
Chloride: 99 mmol/L (ref 98–111)
Creatinine, Ser: 0.66 mg/dL (ref 0.44–1.00)
GFR, Estimated: >60 mL/min (ref >60)
Glucose, Bld: 221 mg/dL — ABNORMAL HIGH (ref 70–99)
Potassium: 4.2 mmol/L (ref 3.5–5.1)
Sodium: 137 mmol/L (ref 135–145)

## 2024-01-19 LAB — GLUCOSE, CAPILLARY
Glucose-Capillary: 132 mg/dL — ABNORMAL HIGH (ref 70–99)
Glucose-Capillary: 150 mg/dL — ABNORMAL HIGH (ref 70–99)
Glucose-Capillary: 156 mg/dL — ABNORMAL HIGH (ref 70–99)
Glucose-Capillary: 167 mg/dL — ABNORMAL HIGH (ref 70–99)
Glucose-Capillary: 188 mg/dL — ABNORMAL HIGH (ref 70–99)
Glucose-Capillary: 188 mg/dL — ABNORMAL HIGH (ref 70–99)

## 2024-01-19 MED ORDER — CHLORHEXIDINE GLUCONATE CLOTH 2 % EX PADS
6.0000 | MEDICATED_PAD | Freq: Every day | CUTANEOUS | Status: DC
  Administered 2024-01-19 – 2024-01-21 (×3): 6 via TOPICAL

## 2024-01-19 MED ORDER — CHLORHEXIDINE GLUCONATE CLOTH 2 % EX PADS
6.0000 | MEDICATED_PAD | Freq: Every day | CUTANEOUS | Status: DC
  Administered 2024-01-18 – 2024-01-21 (×2): 6 via TOPICAL

## 2024-01-19 MED ORDER — LUNG SURGERY BOOK
Freq: Once | Status: AC
  Filled 2024-01-19: qty 1

## 2024-01-19 MED ORDER — INSULIN ASPART 100 UNIT/ML IJ SOLN: 0.0000 [IU] | Freq: Every day | INTRAMUSCULAR | Status: DC

## 2024-01-19 MED ORDER — INSULIN ASPART 100 UNIT/ML IJ SOLN
2.0000 [IU] | Freq: Three times a day (TID) | INTRAMUSCULAR | Status: DC
  Administered 2024-01-19 – 2024-01-22 (×6): 2 [IU] via SUBCUTANEOUS
  Filled 2024-01-19 (×7): qty 2

## 2024-01-19 MED ORDER — MUPIROCIN 2 % EX OINT
1.0000 | TOPICAL_OINTMENT | Freq: Two times a day (BID) | CUTANEOUS | Status: DC
  Administered 2024-01-19 – 2024-01-22 (×7): 1 via NASAL
  Filled 2024-01-19: qty 22

## 2024-01-19 MED ORDER — INSULIN ASPART 100 UNIT/ML IJ SOLN
0.0000 [IU] | Freq: Three times a day (TID) | INTRAMUSCULAR | Status: DC
  Administered 2024-01-19: 17:00:00 2 [IU] via SUBCUTANEOUS
  Administered 2024-01-19 – 2024-01-20 (×2): 3 [IU] via SUBCUTANEOUS
  Administered 2024-01-20 – 2024-01-22 (×4): 2 [IU] via SUBCUTANEOUS
  Filled 2024-01-19 (×2): qty 2
  Filled 2024-01-19: qty 3
  Filled 2024-01-19 (×2): qty 2
  Filled 2024-01-19: qty 3
  Filled 2024-01-19: qty 2

## 2024-01-19 NOTE — TOC CM/SW Note (Signed)
 Transition of Care [facility]) - Inpatient Brief Assessment   Patient Details  Name: [name] MRN: [id number] Date of Birth: [birth year]  [facility] of [facility] CM/SW Contact:    Lauraine FORBES Saa, LCSWA Phone Number: 01/19/2024, 9:25 AM   Clinical Narrative:  9:25 AM Per chart review, patient resides at home with child(ren). Patient has a PCP and insurance. Patient does not have SNF history. Patient has HH history with [facility]. Patient has DME (BSC, CPM) history with [facility]. Patient's preferred pharmacy's are [facility] and [facility] [place]. No TOC needs identified at this time. TOC will continue to follow.  Transition of Care Asessment: Insurance and Status: Insurance coverage has been reviewed Patient has primary care physician: Yes Home environment has been reviewed: Private Residence Prior level of function:: N/A Prior/Current Home Services: No current home services Social Drivers of Health Review: SDOH reviewed no interventions necessary Readmission risk has been reviewed: Yes (Currently Green 11%) Transition of care needs: no transition of care needs at this time

## 2024-01-19 NOTE — Progress Notes (Signed)
° °   °  [address] [employer] [address]             [phone]      1 Day Post-Op Procedures (LRB): LOBECTOMY, LUNG, ROBOT-ASSISTED, USING VATS (Right) BLOCK, NERVE, INTERCOSTAL (Right) LYMPH NODE BIOPSY (Right) Subjective: Patient reports her pain is only about a 2 or 3 this AM but she was out of breath after brushing her hair and putting her earrings in this AM.   Objective: Vital signs in last 24 hours: Temp:  [97.6 F (36.4 C)-98.7 F (37.1 C)] 98.7 F (37.1 C) (12/18 0400) Pulse Rate:  [49-109] 106 (12/18 0400) Cardiac Rhythm: Sinus tachycardia (12/17 2136) Resp:  [11-20] 20 (12/18 0400) BP: (129-183)/(69-99) 129/72 (12/18 0400) SpO2:  [88 %-100 %] 96 % (12/18 0400) Arterial Line BP: (151-182)/(61-80) 151/61 (12/17 2000) Weight:  [89.8 kg-97.5 kg] 97.5 kg (12/17 2043)  Hemodynamic parameters for last 24 hours:    Intake/Output from previous day: 12/17 0701 - 12/18 0700 In: 3582 [P.O.:600; I.V.:2248.6; IV Piggyback:733.4] Out: 1610 [Urine:1300; Blood:200; Chest Tube:110] Intake/Output this shift: No intake/output data recorded.  General appearance: alert, cooperative, and no distress Neurologic: intact Heart: regular rate and rhythm-ST, no murmur Lungs: Coarse lung sounds at right base, otherwise clear to auscultation Abdomen: soft, non-tender; bowel sounds normal; no masses,  no organomegaly Extremities: extremities normal, atraumatic, no cyanosis or edema Wound: Clean and dry without sign of infection  Lab Results: Recent Labs    01/16/24 1130 01/19/24 0211  WBC 7.7 12.0*  HGB 14.6 11.5*  HCT 42.6 34.1*  PLT 275 211   BMET:  Recent Labs    01/16/24 1130 01/19/24 0211  NA 140 137  K 3.6 4.2  CL 102 99  CO2 30 27  GLUCOSE 132* 221*  BUN 10 10  CREATININE 0.72 0.66  CALCIUM  9.7 9.4    PT/INR:  Recent Labs    01/16/24 1130  LABPROT 14.2  INR 1.0   ABG No results found for: PHART, HCO3, TCO2, ACIDBASEDEF,  O2SAT CBG (last 3)  Recent Labs    01/18/24 2105 01/19/24 0007 01/19/24 0430  GLUCAP 204* 188* 188*    Assessment/Plan: S/P Procedures (LRB): LOBECTOMY, LUNG, ROBOT-ASSISTED, USING VATS (Right) BLOCK, NERVE, INTERCOSTAL (Right) LYMPH NODE BIOPSY (Right)  Neuro: Pain controlled, continue current pain regimen  CV: ST, HR 110 this AM. HTN at times, SBP up to 160s but 120s this AM. Restarted on home Losartan  and daily lasix .   Pulm: Saturating well on 2L [place]. CT output 110cc/24hrs. CXR with trace right apical space, improved from yesterday. CT to water seal. CT with large air leak this AM. CT to water seal. Leave CT in today. Encourage IS and ambulation. Wean oxygen for SpO2>90%  GI: Was able to tolerate a little breakfast this AM, no N/V.   Endo: T2DM, preop A1C 5.5. CBGs elevated this AM on SSI. On Metformin  at home, will restart once her appetite is improved. Will add 2U mealtime Novolog .  Renal: Cr 0.66, UO 1300cc/24hrs. Will d/c fluids and foley.   ID: Likely reactive leukocytosis  Expected postop ABLA: H/H 11.5/34.1, not clinically significant at this time.   DVT prophylaxis: Lovenox   Dispo: Leave CT in place due to air leak. Encourage ambulation today.    LOS: 1 day    Con GORMAN Bend, PA-C 01/19/2024

## 2024-01-19 NOTE — Op Note (Unsigned)
 [name], [name] MEDICAL RECORD NO: [id number] ACCOUNT NO: [id number] DATE OF BIRTH: 11-21-1950 FACILITY: [facility] LOCATION: [facility] PHYSICIAN: Elspeth BROCKS. Kerrin, MD  Operative Report   DATE OF PROCEDURE: 01/18/2024  PREOPERATIVE DIAGNOSIS:  Right upper lobe lung nodule.  POSTOPERATIVE DIAGNOSIS:  Malignancy, at least stage IIIA (T2, N2).  PROCEDURE: 1.  Xi Robotic-assisted right upper lobectomy. 2.  Lymph node dissection. 3.  Intercostal nerve blocks, levels 3-10.  SURGEON:  Elspeth BROCKS. Kerrin, MD  ASSISTANT:  Con Bend, PA, and Rocky Shad, [place]. Experienced assistance was necessary for this case due to surgical complexity.  Con Bend assisted with port placement, instrument exchange, specimen retrieval, suctioning, then was relieved by  Mrs. Barrett who also assisted with instrument exchange, specimen retrieval, replacement of ports, suctioning, and wound closure.    ANESTHESIA:  General.  FINDINGS:  Level VII node positive for malignancy.  Bronchial margin positive.  Additional bronchial margin also positive.  CLINICAL NOTE:  The patient is a 73 year old non-smoker who presented in October with a small left thalamic stroke.  During her workup, she was found to have a right upper lobe lung nodule.  Robotic bronchoscopy was performed.  Atypical cells were noted,  but the procedure was limited by bleeding.  She was referred for possible surgical resection.  The indications, risks, benefits, and alternatives were discussed in detail with the patient.  She understood and accepted the risks and agreed to proceed.  OPERATIVE NOTE:  The patient was brought to the operating room on 01/18/2024.  She had induction of general anesthesia.  Because of a difficult airway, the anesthesiology was unable to place a double-lumen endotracheal tube.  A single-lumen tube was  placed and a bronchial blocker was placed in the right mainstem.  She tolerated single-lung ventilation well  throughout the procedure.  Foley catheter was placed.  Sequential compression devices were placed on the calves for DVT prophylaxis.  Intravenous  antibiotics were administered.  She was placed in a left lateral decubitus position.  The right chest was prepped and draped in the usual sterile fashion.  A timeout was performed.  A solution containing 20 mL of liposomal bupivacaine , 30 mL of 0.5% bupivacaine , and 50 mL of saline was prepared.  This solution was used for local at the incision sites as well as for the intercostal nerve blocks.  An incision  was made in the 8th interspace in the midaxillary line.  An 8-mm robotic port was inserted.  The thoracoscope was advanced in the chest.  After confirming intrapleural placement, carbon dioxide was insufflated per protocol.  A 12-mm robotic port was  placed in the 8th interspace anterior to the camera port.  Intercostal nerve blocks then were performed from the 3rd to the 10th interspace, injecting 10 mL of the bupivacaine  solution into a subpleural plane at each level.  A 12-mm AirSeal port was  placed in the 10th interspace posterolaterally.  2 additional 8th interspace robotic ports were placed.  The robot was deployed.  The camera arm was docked.  Targeting was performed.  The remaining arms were docked.  The robotic instruments were inserted  with thoracoscopic visualization.  The patient had a near complete fissure, major fissure.  The minor fissure was incomplete.  The lower lobe was retracted superiorly.  The inferior ligament was divided with bipolar cautery.  Level IX node was removed.  This appeared normal was sent for  permanent pathology.  Dissection then was carried up dividing the pleural reflection posteriorly at  the hilum.  A Level VII node was removed that appeared completely benign.  A second Level VII node closer to the carina was removed and it was very  suspicious in appearance.  It was sent for frozen section.  The space at the  bifurcation of the right upper lobe bronchus from the bronchus intermedius then was explored.  There was dense fibrous tissue around that area.  There was concern that this  represented tumor.  That was dissected off and sent as a Level XI node.  The frozen section on the Level VII node returned showing malignancy.  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  The Level XI tissue was  negative for tumor.  Given the positive N2 lymph node, decision was made to proceed with lobectomy, but pneumonectomy would not be an option given the spread to the mediastinal lymph nodes.  The lung was retracted inferiorly.  The pleural reflection was  divided at the hilum superiorly.  Level X and IV nodes were removed.  Anteriorly, the pleural reflection was divided at the hilum anteriorly.  The phrenic nerve was identified.  It was relatively close to the hilum, but no cautery was used in this  vicinity.  The pleura overlying the pulmonary artery in the fissure was incised and the fissure was developed working posteriorly.  Level XII nodes were removed.  A small portion of the fissure posteriorly was stapled.  There was a large posterior  ascending branch which was encircled and divided with the robotic stapler and then there was a smaller branch adjacent to that that was divided with the SynchroSeal device.  The minor fissure then was completed working from both anterior to posterior as  well as from posterior to anterior approaches.  The dissection of the anterior and apical pulmonary artery branches was relatively slow and tedious, but those were both encircled and divided with the robotic stapler.  A large Level XII node was removed  and sent for permanent pathology.  The stapler then was placed across the right upper lobe bronchus at its origin.  Release of the bronchial blocker showed aeration of the lower and middle lobes.  The staple then was fired, transecting the bronchus.  The   chest was copiously irrigated with saline.  A test inflation to 30 cm of water pressure revealed no air leakage.  The sponges and vessel loop placed during the dissection were removed.  The robotic instruments were removed.  The robot was undocked.  The  anterior 8th interspace incision was lengthened to 3 cm.  A 15-mm endoscopic retrieval bag was placed in the chest.  The upper lobe was manipulated into the bag and removed.  It was sent for frozen section of the bronchial margin.  Unfortunately, that  returned with tumor at the margin.  Decision was made to replace the robotic ports and re-dock the robot.  The instruments were reinserted.  Patient was back on single-lung ventilation.  The staple line was inspected.  There was no gross tumor, but  decision was made to excise the staple line and obtain additional margin both superiorly and inferiorly.  A wedge of tissue was resected encompassing the staple line.  Then additional tissue was resected and sent as the final margin.  The bronchus was  resected as far as was felt possible to reconstruct.  The bronchus then was oversewn with a 2-0 PDS *** suture.  Sutures were started at both ends.  A test  inflation revealed some leakage from needle holes and additional small sutures were placed at  those sites.  The azygos vein was mobilized and was used to cover the bronchial closure.  It was sewn into place with a 3-0 Vicryl suture.  By this point, the frozen section had returned showing tumor at the margins.  It was unclear if this tumor  extended further into the right mainstem.  It was not entirely clear that a pneumonectomy would result in a negative margin, but given the central Level VII node positivity, patient was not felt to be a candidate for a pneumonectomy.  Decision was made  to terminate the procedure.  A final inspection was made of the port sites for hemostasis.  The robotic ports were removed.  A 28-French Blake drain was placed through the  original port incision and directed to the apex.  It was secured with a #1 silk  suture.  The remaining incisions were closed in standard fashion.  The chest tube was placed to a Pleur-evac on water seal.  Patient was then extubated in the operating room and taken to the postanesthetic care unit in good condition.    SUJ D: 01/18/2024 7:37:15 pm T: 01/19/2024 1:07:00 am  JOB: [id number]/ [id number]

## 2024-01-19 NOTE — Plan of Care (Signed)
  Problem: Education: Goal: Knowledge of General Education information will improve Description: Including pain rating scale, medication(s)/side effects and non-pharmacologic comfort measures Outcome: Progressing   Problem: Health Behavior/Discharge Planning: Goal: Ability to manage health-related needs will improve Outcome: Progressing   Problem: Clinical Measurements: Goal: Ability to maintain clinical measurements within normal limits will improve Outcome: Progressing Goal: Will remain free from infection Outcome: Progressing Goal: Diagnostic test results will improve Outcome: Progressing Goal: Respiratory complications will improve Outcome: Progressing Goal: Cardiovascular complication will be avoided Outcome: Progressing   Problem: Activity: Goal: Risk for activity intolerance will decrease Outcome: Progressing   Problem: Nutrition: Goal: Adequate nutrition will be maintained Outcome: Progressing   Problem: Coping: Goal: Level of anxiety will decrease Outcome: Progressing   Problem: Elimination: Goal: Will not experience complications related to bowel motility Outcome: Progressing Goal: Will not experience complications related to urinary retention Outcome: Progressing   Problem: Pain Managment: Goal: General experience of comfort will improve and/or be controlled Outcome: Progressing   Problem: Safety: Goal: Ability to remain free from injury will improve Outcome: Progressing   Problem: Skin Integrity: Goal: Risk for impaired skin integrity will decrease Outcome: Progressing   Problem: Education: Goal: Knowledge of disease or condition will improve Outcome: Progressing Goal: Knowledge of the prescribed therapeutic regimen will improve Outcome: Progressing   Problem: Activity: Goal: Risk for activity intolerance will decrease Outcome: Progressing   Problem: Cardiac: Goal: Will achieve and/or maintain hemodynamic stability Outcome: Progressing    Problem: Clinical Measurements: Goal: Postoperative complications will be avoided or minimized Outcome: Progressing   Problem: Respiratory: Goal: Respiratory status will improve Outcome: Progressing   Problem: Pain Management: Goal: Pain level will decrease Outcome: Progressing

## 2024-01-19 NOTE — Discharge Instructions (Signed)
 Discharge Instructions:  1. You may shower, please wash incisions daily with soap and water and keep dry.  If you wish to cover wounds with dressing you may do so but please keep clean and change daily.  No tub baths or swimming until incisions have completely healed.  If your incisions become red or develop any drainage please call our office at [phone]  2. No Driving until cleared by Dr. Audree Bless office and you are no longer using narcotic pain medications  3. Monitor your weight daily.. Please use the same scale and weigh at same time... If you gain 5-10 lbs in 48 hours with associated lower extremity swelling, please contact our office at [phone]  4. Fever of 101.5 for at least 24 hours with no source, please contact our office at [phone]  5. Activity- up as tolerated, please walk at least 3 times per day.  Avoid strenuous activity, no lifting, pushing, or pulling with your arms over 8-10 lbs for a minimum of 6 weeks  6. If any questions or concerns arise, please do not hesitate to contact our office at [phone]

## 2024-01-19 NOTE — Discharge Summary (Incomplete)
 [address] [place] [address]             [phone]        Physician Discharge Summary  Patient ID: [name] MRN: [id number] DOB/AGE: 1950-02-14 73 y.o.  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   She presented on 11/03/2023 with right sided facial numbness and right finger numbness.  MR showed a subcentimeter left thalamic stroke.  She had a workup for that it was noted to have a right upper lobe lung nodule.  CT of the chest showed a 1.4 cm nodule centrally in the right upper lobe.  She underwent a robotic bronchoscopy.  Sampling was limited by bleeding.  Cytology noted a few atypical cells along with reactive bronchial cells.  No definite malignancy.  She was referred to [facility] and evaluated by Dr. Kerrin at which time she admitted to being a life long non-smoker.  She denied chest pain/tightness, shortness of breath, cough and wheezing.  She denied weight loss.  PET CT scan was reviewed and revealed the nodule to be hypermetabolic and very likely to be a malignancy.  It was recommended she undergo surgical resection via Right upper lobectomy.  The risks and benefits to the procedure were explained to the patient and she was agreeable to proceed.  Hospital Course:  [name] presented to [facility] on [facility]/18/2024.  She was taken to the operating room and underwent Robotic Assisted Right Video Thoracoscopy with Right Upper Lobectomy, Intercostal Nerve Block, Lymph node dissection.  She tolerated  the procedure without difficulty, was extubated, and taken to the PACU in stable condition. On POD1 CXR showed a trace right apical pneumothorax but she had a large air leak. Air leak resolved on ***. She was saturating well on 2L , this was weaned to room air.   Consults: {consultation:18241}  Significant Diagnostic Studies:  EXAM: CT CHEST WITH CONTRAST 11/04/2023 09:06:47 AM   TECHNIQUE: CT of the chest was performed with the administration of 75 mL of iohexol  (OMNIPAQUE ) 350 MG/ML injection. Multiplanar reformatted images are provided for review. Automated exposure control, iterative reconstruction, and/or weight based adjustment of the mA/kV was utilized to reduce the radiation dose to as low as reasonably achievable.   COMPARISON: None available.   CLINICAL HISTORY: Abnormal xray - lung nodule, >= 1 cm.   FINDINGS:   MEDIASTINUM: Heart and pericardium are unremarkable. The central airways are clear.   LYMPH NODES: No mediastinal, hilar or axillary lymphadenopathy.   LUNGS AND PLEURA: Central right upper lobe nodule adjacent to the bronchus measures 15 mm on image 45 series 5. No pulmonary edema. No pleural effusion or pneumothorax.   SOFT TISSUES/BONES: Degenerative spurring of the spine. No acute abnormality of the soft tissues.   UPPER ABDOMEN: Limited images of the upper abdomen demonstrates no acute abnormality.   IMPRESSION: 1. Central right upper lobe 15 mm pulmonary nodule adjacent to the bronchus; recommend dedicated follow-up with PET/CT and/or tissue sampling for further characterization. 2. These results will be called to the ordering clinician  or representative by the radiologist assistant, and communication documented in the pacs or Art Gallery Manager signed by: Norleen Boxer MD 11/04/2023 09:43 AM EDT RP Workstation: [id number]  EXAM: PET AND CT SKULL BASE TO MID THIGH 12/06/2023 05:14:49 PM   TECHNIQUE:    RADIOPHARMACEUTICAL: 10.86 mCi F-18 FDG Uptake time 60 minutes. Glucose level  mg/dl. Blood pool SUV 3.1 cm.   PET imaging was acquired from the base of the skull to the mid thighs. Non-contrast enhanced computed tomography was obtained for attenuation correction and anatomic localization.   COMPARISON: 11/04/2023   CLINICAL HISTORY: Lung nodule, > 8mm.   FINDINGS:   HEAD AND NECK: No metabolically active cervical lymphadenopathy. Bilateral, carotid atheromatous vascular calcification.   CHEST: The right upper lobe nodule measuring approximately 1.4 cm in diameter on image 63 series 7 has maximum SUV of 7.2, compatible with malignancy. Chest atheroma. Benign appearing calcifications along the right hemidiaphragm. No hypermetabolic adenopathy or findings of distant metastatic spread.   ABDOMEN AND PELVIS: No metabolically active intraperitoneal mass. No metabolically active lymphadenopathy. Physiologic activity within the gastrointestinal and genitourinary systems. Diffuse hepatic steatosis. Small type 1 hiatal hernia. Systemic atherosclerosis is present, including the aorta and iliac arteries. Small supraumbilical hernia contains adipose tissue.   BONES AND SOFT TISSUE: No abnormal FDG activity localizes to the bones. No metabolically active aggressive osseous lesion.   IMPRESSION: 1. Right upper lobe pulmonary nodule measuring 1.4 cm with SUV max 7.2, compatible with malignancy. 2. No hypermetabolic adenopathy or distant metastatic disease. 3. Systemic atherosclerosis, including carotid, aortic, and iliac artery calcifications. 4. Diffuse hepatic steatosis. 5. Small type 1 hiatal hernia. 6. Small supraumbilical fat-containing hernia.   Electronically signed by: Ryan Salvage MD 12/06/2023 05:27 PM EST RP Workstation: [id number]    Treatments: surgery: Operative Report    DATE OF PROCEDURE: 01/18/2024   PREOPERATIVE DIAGNOSIS:  Right upper lobe lung  nodule.   POSTOPERATIVE DIAGNOSIS:  Malignancy, at least stage IIIA (T2, N2).   PROCEDURE: 1.  Xi Robotic-assisted right upper lobectomy. 2.  Lymph node dissection. 3.  Intercostal nerve blocks, levels 3-10.   SURGEON:  Elspeth BROCKS. Kerrin, MD  PATHOLOGY:  Discharge Exam: Blood pressure (!) 169/83, pulse 100, temperature 97.7 F (36.5 C), temperature source Oral, resp. rate 20, height 5' 6 (1.676 m), weight 97.5 kg, SpO2 98%. {physical zkjf:6958869}  Disposition:    Allergies as of 01/19/2024   No Known Allergies   Med Rec must be completed prior to using this [facility]***       Follow-up Information     Kerrin Elspeth BROCKS, MD Follow up on 01/30/2024.   Specialty: Cardiothoracic Surgery Why: Appointment is at 3:15, please get CXR at 2:15 on second floor of our office building prior to your appointment with Dr. Kerrin Pass information: [address] [place] [address] [phone]         Teresa Aldona CROME, NP. Schedule an appointment as soon as possible for a visit.   Specialty: Family Medicine Why: For hospital follow Contact information: [address] [place] [address] [phone]                 Signed: Con GORMAN Bend, DEVONNA  01/19/2024, 1:15 PM

## 2024-01-20 ENCOUNTER — Inpatient Hospital Stay (HOSPITAL_COMMUNITY)

## 2024-01-20 LAB — COMPREHENSIVE METABOLIC PANEL WITH GFR
ALT: 20 U/L (ref 0–44)
AST: 25 U/L (ref 15–41)
Albumin: 4.3 g/dL (ref 3.5–5.0)
Alkaline Phosphatase: 76 U/L (ref 38–126)
Anion gap: 9 (ref 5–15)
BUN: 13 mg/dL (ref 8–23)
CO2: 29 mmol/L (ref 22–32)
Calcium: 9.7 mg/dL (ref 8.9–10.3)
Chloride: 99 mmol/L (ref 98–111)
Creatinine, Ser: 0.69 mg/dL (ref 0.44–1.00)
GFR, Estimated: >60 mL/min
Glucose, Bld: 148 mg/dL — ABNORMAL HIGH (ref 70–99)
Potassium: 4 mmol/L (ref 3.5–5.1)
Sodium: 137 mmol/L (ref 135–145)
Total Bilirubin: 0.6 mg/dL (ref 0.0–1.2)
Total Protein: 6.4 g/dL — ABNORMAL LOW (ref 6.5–8.1)

## 2024-01-20 LAB — CBC
HCT: 33.1 % — ABNORMAL LOW (ref 36.0–46.0)
Hemoglobin: 11.4 g/dL — ABNORMAL LOW (ref 12.0–15.0)
MCH: 31.1 pg (ref 26.0–34.0)
MCHC: 34.4 g/dL (ref 30.0–36.0)
MCV: 90.4 fL (ref 80.0–100.0)
Platelets: 219 K/uL (ref 150–400)
RBC: 3.66 MIL/uL — ABNORMAL LOW (ref 3.87–5.11)
RDW: 13.5 % (ref 11.5–15.5)
WBC: 12.3 K/uL — ABNORMAL HIGH (ref 4.0–10.5)
nRBC: 0 % (ref 0.0–0.2)

## 2024-01-20 LAB — GLUCOSE, CAPILLARY
Glucose-Capillary: 133 mg/dL — ABNORMAL HIGH (ref 70–99)
Glucose-Capillary: 155 mg/dL — ABNORMAL HIGH (ref 70–99)
Glucose-Capillary: 115 mg/dL — ABNORMAL HIGH (ref 70–99)
Glucose-Capillary: 114 mg/dL — ABNORMAL HIGH (ref 70–99)

## 2024-01-20 MED ORDER — GUAIFENESIN ER 600 MG PO TB12
600.0000 mg | ORAL_TABLET | Freq: Two times a day (BID) | ORAL | Status: DC
  Administered 2024-01-20 – 2024-01-22 (×5): 600 mg via ORAL
  Filled 2024-01-20 (×5): qty 1

## 2024-01-20 MED ORDER — METOPROLOL SUCCINATE ER 25 MG PO TB24
25.0000 mg | ORAL_TABLET | Freq: Every day | ORAL | Status: DC
  Administered 2024-01-20: 25 mg via ORAL
  Filled 2024-01-20: qty 1

## 2024-01-20 MED ORDER — LOSARTAN POTASSIUM 25 MG PO TABS
25.0000 mg | ORAL_TABLET | Freq: Every evening | ORAL | Status: DC
  Administered 2024-01-20 – 2024-01-21 (×2): 25 mg via ORAL
  Filled 2024-01-20 (×2): qty 1

## 2024-01-20 NOTE — Progress Notes (Addendum)
" ° °   °  [address]       [place] [address]             [phone]       2 Days Post-Op Procedures (LRB): LOBECTOMY, LUNG, ROBOT-ASSISTED, USING VATS (Right) BLOCK, NERVE, INTERCOSTAL (Right) LYMPH NODE BIOPSY (Right)  Subjective: Patient ate her entire breakfast without nausea.  Objective: Vital signs in last 24 hours: Temp:  [97.7 F (36.5 C)-98.4 F (36.9 C)] 98.2 F (36.8 C) (12/19 0502) Pulse Rate:  [94-103] 96 (12/19 0502) Cardiac Rhythm: Sinus tachycardia (12/18 1900) Resp:  [16-20] 16 (12/19 0502) BP: (141-178)/(72-93) 178/88 (12/19 0502) SpO2:  [94 %-98 %] 98 % (12/19 0502)    Intake/Output from previous day: 12/18 0701 - 12/19 0700 In: 180 [P.O.:180] Out: 150 [Chest Tube:150]   Physical Exam:  Cardiovascular: Tachycardia Pulmonary: Clear to auscultation bilaterally; no rales, wheezes, or rhonchi. Abdomen: Soft, non tender, bowel sounds present. Extremities: Mild bilateral lower extremity edema. Wounds: Clean and dry.  No erythema or signs of infection. Chest Tube: to water seal, very small air leak with cough  Lab Results: CBC: Recent Labs    01/19/24 0211 01/20/24 0233  WBC 12.0* 12.3*  HGB 11.5* 11.4*  HCT 34.1* 33.1*  PLT 211 219   BMET:  Recent Labs    01/19/24 0211 01/20/24 0233  NA 137 137  K 4.2 4.0  CL 99 99  CO2 27 29  GLUCOSE 221* 148*  BUN 10 13  CREATININE 0.66 0.69  CALCIUM  9.4 9.7    PT/INR: No results for input(s): LABPROT, INR in the last 72 hours. ABG:  INR: Will add last result for INR, ABG once components are confirmed Will add last 4 CBG results once components are confirmed  Assessment/Plan:  1. CV - ST. Hypertensive. Losartan  50 mg daily. She was taking 50 mg in the am and 25 at night prior to surgery so change to this for better BP control. As discussed with Dr. Kerrin, will add Toprol XL 25 mg daily 2.  Pulmonary - On room air. Chest tube with 150 cc last 24 hours, chest tube to  water seal, and there is a small air leak. No CXR ordered so will order. Chest tube to remain today. Hopefully, will remove chest tube in am.Encourage incentive spirometer. Await final pathology.  3. Expected post op blood loss anemia, not clinically significant-H and H stable at 11.4 and 33.1 4. DM-CBGs 150/132/133 . She was on Metformin  XR prior to surgery and is on Insulin  PRN now. She is just starting to eat better so will restart Metformin  in am. 5. On Lovenox  for DVT prophylaxis 6. Regarding pain control, on Oxy PRN, Toradol  PRN, Morphine  PRN, Gabapentin    Donielle M ZimmermanPA-C 01/20/2024,7:18 AM  Patient seen and examined, agree with above A lot of tidal movement but only a very small air leak Path still pending but known + margin and N2 node Ambulate, SCD, enoxaparin   Elspeth C. Kerrin, MD [facility] [phone]  "

## 2024-01-20 NOTE — Progress Notes (Signed)
 Mobility Specialist Progress Note:    01/20/24 1200  Mobility  Activity Ambulated with assistance  Level of Assistance Contact guard assist, steadying assist  Assistive Device None  Distance Ambulated (ft) 300 ft  Activity Response Tolerated well  Mobility Referral Yes  Mobility visit 1 Mobility  Mobility Specialist Start Time (ACUTE ONLY) 0930  Mobility Specialist Stop Time (ACUTE ONLY) 0945  Mobility Specialist Time Calculation (min) (ACUTE ONLY) 15 min   Received pt in bed having no complaints and agreeable to mobility. Pt was asymptomatic throughout ambulation and returned to room w/o fault. Left in bed w/ call bell in reach and all needs met.   Thersia Minder Mobility Specialist  Please contact vis Secure Chat or  Rehab Office [phone]

## 2024-01-20 NOTE — Care Management Important Message (Signed)
 Important Message  Patient Details  Name: [name] MRN: [id number] Date of Birth: Dec 10, 1950   Important Message Given:  Yes - Medicare IM     Claretta Deed 01/20/2024, 1:48 PM

## 2024-01-21 ENCOUNTER — Inpatient Hospital Stay (HOSPITAL_COMMUNITY)

## 2024-01-21 LAB — GLUCOSE, CAPILLARY
Glucose-Capillary: 130 mg/dL — ABNORMAL HIGH (ref 70–99)
Glucose-Capillary: 122 mg/dL — ABNORMAL HIGH (ref 70–99)
Glucose-Capillary: 109 mg/dL — ABNORMAL HIGH (ref 70–99)
Glucose-Capillary: 130 mg/dL — ABNORMAL HIGH (ref 70–99)

## 2024-01-21 MED ORDER — METOPROLOL SUCCINATE ER 50 MG PO TB24
50.0000 mg | ORAL_TABLET | Freq: Every day | ORAL | Status: DC
  Administered 2024-01-21 – 2024-01-22 (×2): 50 mg via ORAL
  Filled 2024-01-21 (×2): qty 1

## 2024-01-21 NOTE — Progress Notes (Addendum)
" ° °   °  [address]       [place] [address]             [phone]       3 Days Post-Op Procedures (LRB): LOBECTOMY, LUNG, ROBOT-ASSISTED, USING VATS (Right) BLOCK, NERVE, INTERCOSTAL (Right) LYMPH NODE BIOPSY (Right)  Subjective: Patient without complaint this am. She states Mucinex  did help her expectorate  Objective: Vital signs in last 24 hours: Temp:  [98.2 F (36.8 C)-98.7 F (37.1 C)] 98.5 F (36.9 C) (12/20 0817) Pulse Rate:  [86-100] 100 (12/20 0817) Cardiac Rhythm: Normal sinus rhythm (12/19 2000) Resp:  [15-18] 17 (12/20 0817) BP: (153-179)/(71-84) 173/71 (12/20 0817) SpO2:  [93 %-98 %] 95 % (12/20 0817)    Intake/Output from previous day: 12/19 0701 - 12/20 0700 In: 120 [P.O.:120] Out: 140 [Chest Tube:140]   Physical Exam:  Cardiovascular: Slightly tachycardic Pulmonary: Clear to auscultation on left and coarse on right (chest tube in place) Abdomen: Soft, non tender, bowel sounds present. Extremities: Trace bilateral lower extremity edema. Wounds: Clean and dry.  No erythema or signs of infection. Chest Tube: to water seal, very small, intermittent air leak with cough  Lab Results: CBC: Recent Labs    01/19/24 0211 01/20/24 0233  WBC 12.0* 12.3*  HGB 11.5* 11.4*  HCT 34.1* 33.1*  PLT 211 219   BMET:  Recent Labs    01/19/24 0211 01/20/24 0233  NA 137 137  K 4.2 4.0  CL 99 99  CO2 27 29  GLUCOSE 221* 148*  BUN 10 13  CREATININE 0.66 0.69  CALCIUM  9.4 9.7    PT/INR: No results for input(s): LABPROT, INR in the last 72 hours. ABG:  INR: Will add last result for INR, ABG once components are confirmed Will add last 4 CBG results once components are confirmed  Assessment/Plan:  1. CV - SR. She is still hypertensive. On Losartan  50 mg in the am and 25 at night (was on prior to surgery) and Toprol  XL 25 mg daily. As discussed with surgeon, will increase Toprol  XL to 50 mg daily. 2.  Pulmonary - On room air.  Chest tube with 140 cc last 24 hours, chest tube to water seal, and there is a very small, intermittent air leak with cough. CXR this am appears to show small, stable right apical pneumothorax. Will try clamping trial. Mucinex  600 mg bid. Encourage incentive spirometer. Await final pathology.  3. Expected post op blood loss anemia, not clinically significant-H and H stable at 11.4 and 33.1 4. DM-CBGs 115/114/130 . She was on Metformin  XR prior to surgery and is on Insulin  PRN now. She is just starting to eat better. Will restart Metformin  at discharge 5. On Lovenox  for DVT prophylaxis 6. Regarding pain control, on Oxy PRN, Toradol  PRN, Morphine  PRN, Gabapentin   7. Check CXR 12:30 pm. If stable, remove chest tube. Likely discharge in am  Donielle M ZimmermanPA-C 01/21/2024,8:19 AM  Doing well, possible air leak this morning so undergoing clamp trial.  If passes, will plan to remove tube and discharge tomorrow.    Con Clunes, MD Cardiothoracic Surgery Pager: [phone]   "

## 2024-01-21 NOTE — Progress Notes (Signed)
 Mobility Specialist: Progress Note   01/21/24 1300  Mobility  Activity Ambulated with assistance  Level of Assistance Standby assist, set-up cues, supervision of patient - no hands on  Assistive Device Front wheel walker  Distance Ambulated (ft) 300 ft  Activity Response Tolerated well  Mobility Referral Yes  Mobility visit 1 Mobility  Mobility Specialist Start Time (ACUTE ONLY) [id number]  Mobility Specialist Stop Time (ACUTE ONLY) 0945  Mobility Specialist Time Calculation (min) (ACUTE ONLY) 8 min    Pt received in bed, agreeable to mobility session. SV throughout. Feeling a little SOB with mobility today after chest tube was clipped. Ambulated to the door without AD but preferred to use it for hallway ambulation this session d/t feeling a little unsteady. No physical assist needed throughout. Peak HR 125. Returned to room. Left on EOB with all needs met, call bell in reach.   Ileana Lute Mobility Specialist Please contact via SecureChat or Rehab office at [phone]

## 2024-01-21 NOTE — Plan of Care (Signed)

## 2024-01-21 NOTE — Progress Notes (Signed)
 No rush of air after chest tube unclamped, no tidling. CXR shows small, stable right apical space. Will pull chest tube. Check CXR in am and likely discharge in am.

## 2024-01-21 NOTE — Plan of Care (Signed)
" °  Problem: Education: Goal: Knowledge of General Education information will improve Description: Including pain rating scale, medication(s)/side effects and non-pharmacologic comfort measures Outcome: Progressing   Problem: Clinical Measurements: Goal: Ability to maintain clinical measurements within normal limits will improve Outcome: Progressing Goal: Will remain free from infection Outcome: Progressing Goal: Diagnostic test results will improve Outcome: Progressing Goal: Respiratory complications will improve Outcome: Progressing Goal: Cardiovascular complication will be avoided Outcome: Progressing   Problem: Activity: Goal: Risk for activity intolerance will decrease Outcome: Progressing   Problem: Nutrition: Goal: Adequate nutrition will be maintained Outcome: Progressing   Problem: Coping: Goal: Level of anxiety will decrease Outcome: Progressing   Problem: Elimination: Goal: Will not experience complications related to bowel motility Outcome: Progressing Goal: Will not experience complications related to urinary retention Outcome: Progressing   Problem: Pain Managment: Goal: General experience of comfort will improve and/or be controlled Outcome: Progressing   Problem: Safety: Goal: Ability to remain free from injury will improve Outcome: Progressing   Problem: Skin Integrity: Goal: Risk for impaired skin integrity will decrease Outcome: Progressing   Problem: Education: Goal: Knowledge of disease or condition will improve Outcome: Progressing Goal: Knowledge of the prescribed therapeutic regimen will improve Outcome: Progressing   Problem: Activity: Goal: Risk for activity intolerance will decrease Outcome: Progressing   Problem: Cardiac: Goal: Will achieve and/or maintain hemodynamic stability Outcome: Progressing   Problem: Clinical Measurements: Goal: Postoperative complications will be avoided or minimized Outcome: Progressing   Problem:  Respiratory: Goal: Respiratory status will improve Outcome: Progressing   Problem: Pain Management: Goal: Pain level will decrease Outcome: Progressing   Problem: Skin Integrity: Goal: Wound healing without signs and symptoms infection will improve Outcome: Progressing   Problem: Education: Goal: Ability to describe self-care measures that may prevent or decrease complications (Diabetes Survival Skills Education) will improve Outcome: Progressing Goal: Individualized Educational Video(s) Outcome: Progressing   Problem: Coping: Goal: Ability to adjust to condition or change in health will improve Outcome: Progressing   Problem: Fluid Volume: Goal: Ability to maintain a balanced intake and output will improve Outcome: Progressing   Problem: Health Behavior/Discharge Planning: Goal: Ability to identify and utilize available resources and services will improve Outcome: Progressing Goal: Ability to manage health-related needs will improve Outcome: Progressing   Problem: Metabolic: Goal: Ability to maintain appropriate glucose levels will improve Outcome: Progressing   Problem: Nutritional: Goal: Maintenance of adequate nutrition will improve Outcome: Progressing Goal: Progress toward achieving an optimal weight will improve Outcome: Progressing   Problem: Skin Integrity: Goal: Risk for impaired skin integrity will decrease Outcome: Progressing   Problem: Tissue Perfusion: Goal: Adequacy of tissue perfusion will improve Outcome: Progressing   "

## 2024-01-22 ENCOUNTER — Inpatient Hospital Stay (HOSPITAL_COMMUNITY)

## 2024-01-22 ENCOUNTER — Other Ambulatory Visit (HOSPITAL_COMMUNITY): Payer: Self-pay

## 2024-01-22 DIAGNOSIS — Z902 Acquired absence of lung [part of]: Secondary | ICD-10-CM

## 2024-01-22 LAB — GLUCOSE, CAPILLARY
Glucose-Capillary: 133 mg/dL — ABNORMAL HIGH (ref 70–99)
Glucose-Capillary: 78 mg/dL (ref 70–99)

## 2024-01-22 MED ORDER — METOPROLOL SUCCINATE ER 50 MG PO TB24
50.0000 mg | ORAL_TABLET | Freq: Every day | ORAL | 1 refills | Status: AC
  Filled 2024-01-22: qty 30, 30d supply, fill #0

## 2024-01-22 MED ORDER — ACETAMINOPHEN 325 MG PO TABS: 650.0000 mg | ORAL_TABLET | Freq: Four times a day (QID) | ORAL | Status: AC | PRN

## 2024-01-22 MED ORDER — OXYCODONE HCL 5 MG PO TABS
5.0000 mg | ORAL_TABLET | Freq: Four times a day (QID) | ORAL | 0 refills | Status: AC | PRN
  Filled 2024-01-22: qty 28, 7d supply, fill #0

## 2024-01-22 MED ORDER — GABAPENTIN 300 MG PO CAPS
300.0000 mg | ORAL_CAPSULE | Freq: Every day | ORAL | 0 refills | Status: DC
  Filled 2024-01-22: qty 30, 30d supply, fill #0

## 2024-01-22 NOTE — Progress Notes (Addendum)
" ° °   °  [address]       [name] [address]             [phone]       4 Days Post-Op Procedures (LRB): LOBECTOMY, LUNG, ROBOT-ASSISTED, USING VATS (Right) BLOCK, NERVE, INTERCOSTAL (Right) LYMPH NODE BIOPSY (Right)  Subjective: Patient without complaint this am. She really wants to go home.  Objective: Vital signs in last 24 hours: Temp:  [98.1 F (36.7 C)-99.1 F (37.3 C)] 98.3 F (36.8 C) (12/21 0422) Pulse Rate:  [90-108] 96 (12/21 0422) Cardiac Rhythm: Normal sinus rhythm (12/20 1943) Resp:  [14-19] 16 (12/21 0422) BP: (129-173)/(60-98) 129/60 (12/21 0422) SpO2:  [94 %-97 %] 94 % (12/21 0422)    Intake/Output from previous day: 12/20 0701 - 12/21 0700 In: 240 [P.O.:240] Out: 0    Physical Exam:  Cardiovascular: RRR Pulmonary: Clear to auscultation on left and slightly diminished right apex Abdomen: Soft, non tender, bowel sounds present. Extremities: Trace bilateral lower extremity edema. Wounds: Clean and dry.  No erythema or signs of infection.   Lab Results: CBC: Recent Labs    01/20/24 0233  WBC 12.3*  HGB 11.4*  HCT 33.1*  PLT 219   BMET:  Recent Labs    01/20/24 0233  NA 137  K 4.0  CL 99  CO2 29  GLUCOSE 148*  BUN 13  CREATININE 0.69  CALCIUM  9.7    PT/INR: No results for input(s): LABPROT, INR in the last 72 hours. ABG:  INR: Will add last result for INR, ABG once components are confirmed Will add last 4 CBG results once components are confirmed  Assessment/Plan:  1. CV - SR. BP and HR better controlled with increase in Toprol  XL to 50 mg yesterday. She is on Losartan  50 mg in the am and 25 at night (was on prior to surgery) as well.  2.  Pulmonary - On room air. Chest tube removed yesterday. CXR this am shows an increase in right apical pneumothorax and mild right chest wall emphysema. Mucinex  600 mg bid. Encourage incentive spirometer. Await final pathology.  3. Expected post op blood loss anemia, not  clinically significant-Last H and H stable at 11.4 and 33.1 4. DM-CBGs 109/130/133 . She was on Metformin  XR prior to surgery and is on Insulin  PRN now. She is eating well. Will restart Metformin   5. On Lovenox  for DVT prophylaxis 6. Regarding pain control, on Oxy PRN, Toradol  PRN, Morphine  PRN, Gabapentin  . Patient requesting Oxy 5 mg PRN and Gabapentin  at bedtime upon discharge 7. As discussed with Dr. Daniel, check CXR at noon. If stable will discharge. If not, patient to remain and re check CXR in am  Sherah Lund M ZimmermanPA-C 01/22/2024,8:05 AM    "

## 2024-01-22 NOTE — Plan of Care (Signed)
   Problem: Health Behavior/Discharge Planning: Goal: Ability to manage health-related needs will improve Outcome: Progressing

## 2024-01-23 ENCOUNTER — Other Ambulatory Visit: Payer: Self-pay | Admitting: Thoracic Surgery (Cardiothoracic Vascular Surgery)

## 2024-01-23 DIAGNOSIS — R911 Solitary pulmonary nodule: Secondary | ICD-10-CM

## 2024-01-23 LAB — SURGICAL PATHOLOGY

## 2024-01-24 ENCOUNTER — Other Ambulatory Visit: Payer: Self-pay | Admitting: Thoracic Surgery (Cardiothoracic Vascular Surgery)

## 2024-01-24 ENCOUNTER — Ambulatory Visit (HOSPITAL_COMMUNITY)
Admission: RE | Admit: 2024-01-24 | Discharge: 2024-01-24 | Disposition: A | Discharge status: Home / Self Care | Source: Ambulatory Visit

## 2024-01-24 DIAGNOSIS — R911 Solitary pulmonary nodule: Secondary | ICD-10-CM

## 2024-01-30 ENCOUNTER — Ambulatory Visit (HOSPITAL_COMMUNITY)
Admission: RE | Admit: 2024-01-30 | Discharge: 2024-01-30 | Disposition: A | Discharge status: Home / Self Care | Source: Ambulatory Visit | Attending: Cardiology | Admitting: Cardiology

## 2024-01-30 ENCOUNTER — Encounter: Payer: Self-pay | Admitting: Thoracic Surgery (Cardiothoracic Vascular Surgery)

## 2024-01-30 ENCOUNTER — Ambulatory Visit
Payer: Self-pay | Attending: Thoracic Surgery (Cardiothoracic Vascular Surgery) | Admitting: Thoracic Surgery (Cardiothoracic Vascular Surgery)

## 2024-01-30 VITALS — BP 150/70 | HR 106 | Resp 20 | Ht 66.0 in | Wt 213.0 lb

## 2024-01-30 DIAGNOSIS — Z09 Encounter for follow-up examination after completed treatment for conditions other than malignant neoplasm: Secondary | ICD-10-CM

## 2024-01-30 DIAGNOSIS — R911 Solitary pulmonary nodule: Secondary | ICD-10-CM | POA: Diagnosis present

## 2024-01-30 MED ORDER — GABAPENTIN 300 MG PO CAPS: 300.0000 mg | ORAL_CAPSULE | Freq: Two times a day (BID) | ORAL | 2 refills | Status: AC

## 2024-01-30 NOTE — Progress Notes (Signed)
 "  [address], [place] [address]             [phone]     HPI:  Mrs. Neighbors returns for a scheduled follow up visit.  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  She is a life long non-smoker.   She presented on 11/03/2023 with right sided facial numbness and right finger numbness.  MR showed a subcentimeter left thalamic stroke.  She had a workup for that it was noted to have a right upper lobe lung nodule.  CT of the chest showed a 1.4 cm nodule centrally in the right upper lobe.  She underwent a robotic bronchoscopy.  Sampling was limited by bleeding.  Cytology noted a few atypical cells along with reactive bronchial cells.  No definite malignancy.  I did a robotic assisted Right upper lobectomy on 01/18/2024.  She had involvement of level 11 nodal site and positive margins even after re-resecting the bronchial margin.  Went home on POD # 3.  Still having some incisional pain, mostly at night.  Oxycodone  has been causing some side effects so only using acetaminophen .  Also on gabapentin  at night.  Past Medical History:  Diagnosis Date   Arthritis    Breast cancer (HCC)    Breast cancer, right breast (HCC) 07/21/2011   Diabetes mellitus without complication (HCC)    GERD (gastroesophageal reflux disease)    Hypertension    Personal history of radiation therapy 2007   Pneumonia 1988   h/o   PONV (postoperative nausea and vomiting)    Primary localized osteoarthritis of right knee 12/26/2017   Stroke [facility])     Current Outpatient Medications  Medication Sig Dispense Refill   acetaminophen  (TYLENOL ) 325 MG tablet Take 2 tablets (650 mg total) by mouth every 6 (six) hours as needed for mild pain (pain score 1-3).     ALPRAZolam  (XANAX ) 0.5 MG tablet Take 0.25 mg by mouth daily as needed for anxiety or sleep.     aspirin  EC 81  MG tablet Take 1 tablet (81 mg total) by mouth daily. Swallow whole.     cholecalciferol (VITAMIN D3) 25 MCG (1000 UNIT) tablet Take 1,000 Units by mouth in the morning.     famotidine  (PEPCID ) 40 MG tablet Take 40 mg by mouth every evening.     furosemide  (LASIX ) 20 MG tablet Take 20 mg by mouth in the morning.     ibuprofen (ADVIL) 200 MG tablet Take 400 mg by mouth every 8 (eight) hours as needed (pain.).     losartan  (COZAAR ) 50 MG tablet Take 50 mg by mouth daily. (Patient taking differently: Take 25-50 mg by mouth See admin instructions. 1 am & 0.5 qpm)     Magnesium  Bisglycinate (MAG GLYCINATE PO) Take 240 mg by mouth at bedtime.     metFORMIN  (GLUCOPHAGE -XR) 500 MG 24 hr tablet Take 500 mg by mouth daily with breakfast.     metoprolol  succinate (TOPROL -XL) 50 MG 24 hr tablet Take 1 tablet (50 mg total) by mouth daily. Take with or immediately following a meal. 30 tablet 1   mometasone (ELOCON) 0.1 % cream Apply 1 Application topically daily as needed (eczema).     Multiple Vitamin (MULTIVITAMIN) tablet Take 1 tablet by mouth in the morning.     Omega-3 Fatty Acids (FISH OIL ) 1200 MG CAPS  Take 2 capsules (2,400 mg total) by mouth daily. (Patient taking differently: Take 1,200 mg by mouth daily.)     oxyCODONE  (OXY IR/ROXICODONE ) 5 MG immediate release tablet Take 1 tablet (5 mg total) by mouth every 6 (six) hours as needed for severe pain (pain score 7-10). 28 tablet 0   pantoprazole  (PROTONIX ) 40 MG tablet Take 40 mg by mouth in the morning.     Probiotic Product (PROBIOTIC FORMULA PO) Take 1 tablet by mouth in the morning.     psyllium (REGULOID) 0.52 g capsule Take 3 capsules by mouth daily. (Patient taking differently: Take 1 capsule by mouth in the morning.)     riboflavin (VITAMIN B-2) 100 MG TABS tablet Take 100 mg by mouth in the morning.     rosuvastatin  (CRESTOR ) 10 MG tablet Take 1 tablet (10 mg total) by mouth at bedtime. 30 tablet 5   gabapentin  (NEURONTIN ) 300 MG capsule Take 1  capsule (300 mg total) by mouth 2 (two) times daily. 60 capsule 2   No current facility-administered medications for this visit.    Physical Exam BP (!) 150/70   Pulse (!) 106   Resp 20   Ht 5' 6 (1.676 m)   Wt 213 lb (96.6 kg)   SpO2 97% Comment: RA  BMI 34.38 kg/m  73 yo woman in NAD Alert and oriented x 3 Lungs slightly decreased on right, o/w clear Cardiac mildly tachy, regular Incisions healing well No peripheral edema  Diagnostic Tests: 2 VIEW(S) XRAY OF THE CHEST 01/30/2024 02:41:00 PM   COMPARISON: 01/24/2024 chest radiograph.   CLINICAL HISTORY: lung   FINDINGS:   LUNGS AND PLEURA: Moderate right hydropneumothorax with overall decreased size including decreased right apical pneumothorax component, although with new layering small right pleural effusion component. No left pneumothorax. No left pleural effusion. No mediastinal shift.   HEART AND MEDIASTINUM: No acute abnormality of the cardiac and mediastinal silhouettes.   BONES AND SOFT TISSUES: Moderate subcutaneous emphysema throughout the lateral right chest wall and lower right neck, decreased. No acute osseous abnormality.   IMPRESSION: 1. Moderate right hydropneumothorax, overall decreased in size with decreased right apical pneumothorax component and new small layering right pleural effusion component. 2. Moderate subcutaneous emphysema throughout the lateral right chest wall and lower right neck, decreased. 3. No mediastinal shift.   Electronically signed by: Selinda Blue MD 01/30/2024 04:23 PM EST RP Workstation: [id number] I reviewed the CXR images.  Postop change with right apical space.  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  She is a life long non-smoker.  Incidentally noted to have a right upper lobe lung nodule when being worked up for a  stroke.  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  Clinical stage IA but pathologic stage IIB (T1, N1) with positive margins.  Not a candidate for pneumonectomy with recent stroke.  Will need adjuvant therapy including radiation to site and likely systemic therapy as well.  Will refer to Dr. Sherrod and Radiation oncology  May drive on a limited basis, precautions discussed.  Recommended she try acetaminophen  325 mg + ibuprofen 200 mg up to 4 times daily as needed for pain and increase gabapentin  to 300 mg PO BID  Plan: Increase gabapentin  to 300 mg PO BID Try combination of acetaminophen  325 mg + ibuprofen 200 mg up to 4 times daily Referral to Rad  onc - + margin She has appointment with Dr. Sherrod Return in 2 weeks with PA and lateral CXR  [name], MD [facility] [phone]     "

## 2024-02-01 NOTE — Progress Notes (Signed)
 " Radiation Oncology         [phone] ________________________________  Name: [name]        MRN: [id number]  Date of Service: 02/03/2024 DOB: 1950-11-21  CC:Alison Aldona CROME, NP  Alison Elspeth BROCKS, MD     REFERRING PHYSICIAN: Kerrin Elspeth BROCKS, MD   DIAGNOSIS: The encounter diagnosis was Primary cancer of right upper lobe of lung (HCC). C34.11    HISTORY OF PRESENT ILLNESS: [name] is a 73 y.o. female seen in consultation for radiation therapy.  She has a hx of screen detected R breast cancer s/p lumpectomy and adjuvant radiation (completed May of 2007) and 5 years of adjuvant tamoxifen and recent L thalamic ischemic stroke. She is a lifetime nonsmoker.   She initially presented on 11/03/23 w/ R sided facial numbness and R finger numbness. Brain MRI demonstrated a subcentimeter L thalamic infarct. As part of her stroke evaluation, a RUL pulmonary nodule was incidentally identified on CTA of the neck. Subsequent CT Chest imaging revealed a centrally located 1.4 cm RUL nodule. PET scan revealed this to be PET avid and consistent with malignancy. She underwent robotic bronchoscopy, however, tissue sampling was limited by bleeding. Cytology revealed a few atypical cells with reactive bronchial epithelium without definitive malignancy.  Given persistent concern for malignancy, she underwent a robotic-assisted right upper lobectomy with mediastinal LN sampling w/ Dr. Kerrin on 01/18/24. 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 The tumor surrounded and invaded the cartilaginous wall of the main RUL bronchus and extended to the bronchial margin. Despite re-resection, the final bronchial margin remained positive.  LN evaluation included 11 nodes from stations 4R, 7, 9, 10, 11 and 12. All identifiable nodes were negative for metastatic. One level 11 specimen demonstrated  adenocarcinoma involving fibrous connective tissue without identifiable lymph node architecture. There was no evidence of LVSI or visceral pleural invasion. Pathologic staging was pT2aN0.  Her postoperative course was uncomplicated. She has incisional pain and is managing with acetaminophen  and gabapentin . She does not tolerate oxycodone .  PREVIOUS RADIATION THERAPY: Yes for R sided breast cancer, treated back in 2007  AUTOIMMUNE DISEASE: {EXAM; YES/NO:19492::No}  MEDICAL DEVICES: {EXAM; YES/NO:19492::No}  PREGNANCY: {EXAM; YES/NO:19492::No}   PAST MEDICAL HISTORY:  Past Medical History:  Diagnosis Date   Arthritis    Breast cancer (HCC)    Breast cancer, right breast (HCC) 07/21/2011   Diabetes mellitus without complication (HCC)    GERD (gastroesophageal reflux disease)    Hypertension    Personal history of radiation therapy 2007   Pneumonia 1988   h/o   PONV (postoperative nausea and vomiting)    Primary localized osteoarthritis of right knee 12/26/2017   Stroke [facility])        PAST SURGICAL HISTORY: Past Surgical History:  Procedure Laterality Date   ABDOMINAL HYSTERECTOMY  1994   ANAL FISSURE REPAIR  1999   APPENDECTOMY  2015   BREAST BIOPSY Right 03/05/2005   BREAST LUMPECTOMY Right 03/25/2005   BREAST REDUCTION SURGERY  2011   BRONCHIAL BIOPSY  12/12/2023   Procedure: BRONCHOSCOPY, WITH BIOPSY;  Surgeon: Alison Zola SAILOR, MD;  Location: [facility];  Service: Pulmonary;;   BRONCHIAL NEEDLE ASPIRATION BIOPSY  12/12/2023   Procedure: BRONCHOSCOPY, WITH NEEDLE ASPIRATION BIOPSY;  Surgeon: Alison Zola SAILOR, MD;  Location: [facility];  Service: Pulmonary;;   CARPAL TUNNEL RELEASE Bilateral    INTERCOSTAL NERVE BLOCK Right 01/18/2024  Procedure: BLOCK, NERVE, INTERCOSTAL;  Surgeon: Alison Elspeth BROCKS, MD;  Location: [facility] OR;  Service: Thoracic;  Laterality: Right;   KNEE ARTHROSCOPY Right    2013,2018   LOBECTOMY, LUNG, ROBOT-ASSISTED, USING VATS Right  01/18/2024   Procedure: LOBECTOMY, LUNG, ROBOT-ASSISTED, USING VATS;  Surgeon: Alison Elspeth BROCKS, MD;  Location: [facility] OR;  Service: Thoracic;  Laterality: Right;  ROBOTIC RIGHT UPPER LOBECTOMY   LYMPH NODE BIOPSY Right 01/18/2024   Procedure: LYMPH NODE BIOPSY;  Surgeon: Alison Elspeth BROCKS, MD;  Location: [facility] OR;  Service: Thoracic;  Laterality: Right;   REDUCTION MAMMAPLASTY     TOTAL KNEE ARTHROPLASTY Right 12/26/2017   Procedure: RIGHT TOTAL KNEE ARTHROPLASTY;  Surgeon: Alison Charleston, MD;  Location: [facility] OR;  Service: Orthopedics;  Laterality: Right;   VIDEO BRONCHOSCOPY WITH ENDOBRONCHIAL NAVIGATION Right 12/12/2023   Procedure: VIDEO BRONCHOSCOPY WITH ENDOBRONCHIAL NAVIGATION;  Surgeon: Alison Zola SAILOR, MD;  Location: [facility];  Service: Pulmonary;  Laterality: Right;   VIDEO BRONCHOSCOPY WITH ENDOBRONCHIAL ULTRASOUND  12/12/2023   Procedure: BRONCHOSCOPY, WITH EBUS;  Surgeon: Alison Zola SAILOR, MD;  Location: [facility];  Service: Pulmonary;;     FAMILY HISTORY:  Family History  Problem Relation Age of Onset   Stroke Mother    Congestive Heart Failure Father    Stroke Maternal Grandmother    Breast cancer Paternal Aunt    Breast cancer Paternal Aunt    Breast cancer Paternal Aunt    Seizures Neg Hx    Migraines Neg Hx    Sleep apnea Neg Hx      SOCIAL HISTORY:  reports that she has never smoked. She has never used smokeless tobacco. She reports that she does not currently use alcohol. She reports that she does not use drugs.   ALLERGIES: Patient has no known allergies.   MEDICATIONS:  Current Outpatient Medications  Medication Sig Dispense Refill   acetaminophen  (TYLENOL ) 325 MG tablet Take 2 tablets (650 mg total) by mouth every 6 (six) hours as needed for mild pain (pain score 1-3).     ALPRAZolam  (XANAX ) 0.5 MG tablet Take 0.25 mg by mouth daily as needed for anxiety or sleep.     aspirin  EC 81 MG tablet Take 1 tablet (81 mg total) by mouth daily. Swallow  whole.     cholecalciferol (VITAMIN D3) 25 MCG (1000 UNIT) tablet Take 1,000 Units by mouth in the morning.     famotidine  (PEPCID ) 40 MG tablet Take 40 mg by mouth every evening.     furosemide  (LASIX ) 20 MG tablet Take 20 mg by mouth in the morning.     gabapentin  (NEURONTIN ) 300 MG capsule Take 1 capsule (300 mg total) by mouth 2 (two) times daily. 60 capsule 2   ibuprofen (ADVIL) 200 MG tablet Take 400 mg by mouth every 8 (eight) hours as needed (pain.).     losartan  (COZAAR ) 50 MG tablet Take 50 mg by mouth daily. (Patient taking differently: Take 25-50 mg by mouth See admin instructions. 1 am & 0.5 qpm)     Magnesium  Bisglycinate (MAG GLYCINATE PO) Take 240 mg by mouth at bedtime.     metFORMIN  (GLUCOPHAGE -XR) 500 MG 24 hr tablet Take 500 mg by mouth daily with breakfast.     metoprolol  succinate (TOPROL -XL) 50 MG 24 hr tablet Take 1 tablet (50 mg total) by mouth daily. Take with or immediately following a meal. 30 tablet 1   mometasone (ELOCON) 0.1 % cream Apply 1 Application topically daily as needed (eczema).  Multiple Vitamin (MULTIVITAMIN) tablet Take 1 tablet by mouth in the morning.     Omega-3 Fatty Acids (FISH OIL ) 1200 MG CAPS Take 2 capsules (2,400 mg total) by mouth daily. (Patient taking differently: Take 1,200 mg by mouth daily.)     oxyCODONE  (OXY IR/ROXICODONE ) 5 MG immediate release tablet Take 1 tablet (5 mg total) by mouth every 6 (six) hours as needed for severe pain (pain score 7-10). 28 tablet 0   pantoprazole  (PROTONIX ) 40 MG tablet Take 40 mg by mouth in the morning.     Probiotic Product (PROBIOTIC FORMULA PO) Take 1 tablet by mouth in the morning.     psyllium (REGULOID) 0.52 g capsule Take 3 capsules by mouth daily. (Patient taking differently: Take 1 capsule by mouth in the morning.)     riboflavin (VITAMIN B-2) 100 MG TABS tablet Take 100 mg by mouth in the morning.     rosuvastatin  (CRESTOR ) 10 MG tablet Take 1 tablet (10 mg total) by mouth at bedtime. 30  tablet 5   No current facility-administered medications for this visit.     REVIEW OF SYSTEMS: The patient reports that they are doing well generally. Pertinent ROS per HPI and otherwise negative.    PHYSICAL EXAM:  Wt Readings from Last 3 Encounters:  01/30/24 213 lb (96.6 kg)  01/18/24 214 lb 15.2 oz (97.5 kg)  01/16/24 215 lb 6.4 oz (97.7 kg)   Temp Readings from Last 3 Encounters:  01/22/24 98.3 F (36.8 C) (Oral)  01/16/24 97.7 F (36.5 C) (Oral)  01/04/24 98.6 F (37 C) (Temporal)   BP Readings from Last 3 Encounters:  01/30/24 (!) 150/70  01/22/24 (!) 124/59  01/16/24 (!) 169/88   Pulse Readings from Last 3 Encounters:  01/30/24 (!) 106  01/22/24 96  01/16/24 (!) 113    /10   Physical Exam   ECOG = ***   LABORATORY DATA:  Lab Results  Component Value Date   WBC 12.3 (H) 01/20/2024   HGB 11.4 (L) 01/20/2024   HCT 33.1 (L) 01/20/2024   MCV 90.4 01/20/2024   PLT 219 01/20/2024   Lab Results  Component Value Date   NA 137 01/20/2024   K 4.0 01/20/2024   CL 99 01/20/2024   CO2 29 01/20/2024   Lab Results  Component Value Date   ALT 20 01/20/2024   AST 25 01/20/2024   ALKPHOS 76 01/20/2024   BILITOT 0.6 01/20/2024      RADIOGRAPHY:   PET 12/06/23:  FINDINGS:   HEAD AND NECK: No metabolically active cervical lymphadenopathy. Bilateral, carotid atheromatous vascular calcification.   CHEST: The right upper lobe nodule measuring approximately 1.4 cm in diameter on image 63 series 7 has maximum SUV of 7.2, compatible with malignancy. Chest atheroma. Benign appearing calcifications along the right hemidiaphragm. No hypermetabolic adenopathy or findings of distant metastatic spread.   ABDOMEN AND PELVIS: No metabolically active intraperitoneal mass. No metabolically active lymphadenopathy. Physiologic activity within the gastrointestinal and genitourinary systems. Diffuse hepatic steatosis. Small type 1 hiatal hernia. Systemic  atherosclerosis is present, including the aorta and iliac arteries. Small supraumbilical hernia contains adipose tissue.   BONES AND SOFT TISSUE: No abnormal FDG activity localizes to the bones. No metabolically active aggressive osseous lesion.   IMPRESSION: 1. Right upper lobe pulmonary nodule measuring 1.4 cm with SUV max 7.2, compatible with malignancy. 2. No hypermetabolic adenopathy or distant metastatic disease. 3. Systemic atherosclerosis, including carotid, aortic, and iliac artery calcifications. 4. Diffuse hepatic steatosis. 5.  Small type 1 hiatal hernia. 6. Small supraumbilical fat-containing hernia.  PET Super D CT 12/06/23:   FINDINGS:   HEAD AND NECK: No metabolically active cervical lymphadenopathy.   CHEST: A central right upper lobe nodule measures 1.6 x 1.0 cm on image 51 series 4. This nodule was hypermetabolic on PET CT and suspicious for malignancy. Associated airway plugging posteriorly in the right upper lobe. A 2 x 3 mm right upper lobe nodule on image 31 series 4 is nonspecific due to small size. Benign appearing calcifications along the right hemidiaphragm. Thoracic spondylosis. Mild dextroconvex thoracic scoliosis. No metabolically active lymphadenopathy.   ABDOMEN AND PELVIS: Abdominal aortic atherosclerosis. Diffuse hepatic steatosis. No metabolically active intraperitoneal mass. No metabolically active lymphadenopathy. Physiologic activity within the gastrointestinal and genitourinary systems.   BONES AND SOFT TISSUE: No abnormal FDG activity localizes to the bones. No metabolically active aggressive osseous lesion.   IMPRESSION: 1. Central right upper lobe 1.6 x 1.0 cm hypermetabolic nodule suspicious for malignancy, with associated right upper lobe airway plugging posteriorly. 2. Aortic and coronary atherosclerosis. 3. Diffuse hepatic steatosis. 4. Thoracic spondylosis. 5. Mild dextroconvex thoracic scoliosis.     CT Chest W  11/04/23:  FINDINGS:   MEDIASTINUM: Heart and pericardium are unremarkable. The central airways are clear.   LYMPH NODES: No mediastinal, hilar or axillary lymphadenopathy.   LUNGS AND PLEURA: Central right upper lobe nodule adjacent to the bronchus measures 15 mm on image 45 series 5. No pulmonary edema. No pleural effusion or pneumothorax.   SOFT TISSUES/BONES: Degenerative spurring of the spine. No acute abnormality of the soft tissues.   UPPER ABDOMEN: Limited images of the upper abdomen demonstrates no acute abnormality.   IMPRESSION: 1. Central right upper lobe 15 mm pulmonary nodule adjacent to the bronchus; recommend dedicated follow-up with PET/CT and/or tissue sampling for further characterization. 2. These results will be called to the ordering clinician or representative by the radiologist assistant, and communication documented in the pacs or clario Dashboard    CT Angio Head Neck [facility] 11/03/23:  CLINICAL HISTORY: Stroke/TIA, determine embolic source. Numbness. Pt to ED via EMS with c/o right-sided facial numbness (around the mouth) and right thumb/right index finger numbness that started last night around 1800. Pt reports moving boxes this past weekend causing lower back pain. Pt denies pain at this time. Good grips, no facial droop. Pt A\T\Ox4.   FINDINGS:   CTA NECK: AORTIC ARCH AND ARCH VESSELS: Minimal atherosclerosis along the visualized distal aortic arch. Minimal atherosclerosis at the left subclavian artery origin without stenosis. No dissection or arterial injury. No significant stenosis of the brachiocephalic or subclavian arteries.   CERVICAL CAROTID ARTERIES: The right carotid artery is patent from the origin to the skull base. Calcified atherosclerosis at the right carotid bifurcation without hemodynamically significant stenosis. The left carotid artery is patent from the origin to the skull base. Calcified atherosclerosis at the left  carotid bifurcation without hemodynamically significant stenosis. No dissection or arterial injury.   CERVICAL VERTEBRAL ARTERIES: No dissection, arterial injury, or significant stenosis.   LUNGS AND MEDIASTINUM: Abnormal soft tissue adjacent to the bronchovascular bundle in the medial right upper lobe just superior to the right hilum which could reflect a pulmonary nodule/mass versus enlarged lymph nodes recommend correlation with dedicated CT chest.   SOFT TISSUES: No acute abnormality.   BONES: Degenerative changes in the visualized spine.   CTA HEAD: ANTERIOR CIRCULATION: The intracranial internal carotid arteries are patent bilaterally. Atherosclerosis of the bilateral carotid siphons. Mild  stenosis of the right supraclinoid ICA. The middle cerebral arteries are patent bilaterally. There is tapering and mild stenosis of the distal M1 segment left MCA. Additional mild stenosis of a proximal M2 branch of the left MCA. The anterior cerebral arteries are patent bilaterally. No aneurysm.   POSTERIOR CIRCULATION: The posterior cerebral arteries are patent bilaterally. There is moderate to severe stenosis of the P2 segment left PCA. The superior cerebellar arteries are patent bilaterally. The basilar artery is patent. The posterior inferior cerebellar arteries are patent bilaterally. No significant stenosis of the vertebral arteries. No aneurysm.   OTHER: No dural venous sinus thrombosis on this non-dedicated study.   IMPRESSION: 1. No large vessel occlusion. 2. Mild stenosis of the right supraclinoid ICA. 3. Tapering and mild stenosis of the distal M1 segment left MCA and additional mild stenosis of a proximal M2 branch of the left MCA. 4. Moderate to severe stenosis of the P2 segment left PCA. 5. Abnormal soft tissue adjacent to the bronchovascular bundle in the medial right upper lobe just superior to the right hilum, which could reflect a pulmonary nodule/mass versus  enlarged lymph nodes. Recommend correlation with dedicated CT chest.    PATHOLOGY:    Right upper lobe lobectomy and lymph node sampling 01/18/2024 :  A. LYMPH NODE, LEVEL 7 #2, EXCISION: Lymph node negative for metastatic carcinoma (0/1).  B. LYMPH NODE, LEVEL 11, EXCISION: Adenocarcinoma involving fibrous connective tissue. Lymph node tissue is not identified.  C. LUNG, RIGHT UPPER LOBE, LOBECTOMY: Invasive adenocarcinoma, moderately to poorly differentiated, 2.2 cm. Carcinoma surrounds and involves the wall of the main bronchus. Carcinoma extends to the bronchial margin of the specimen. One lymph node negative for metastatic carcinoma (0/1). See oncology table and comment.  D. LUNG, RIGHT MAIN STEM MARGIN: Adenocarcinoma involving fibrous tissue and bronchial wall.  E. LUNG, FINAL MARGIN: Adenocarcinoma involving fibrous tissue and bronchial wall.  F. LYMPH NODE, LEVEL 9, EXCISION: Lymph node negative for metastatic carcinoma (0/1). 2a G. LYMPH NODE, LEVEL 7, EXCISION: Lymph node negative for metastatic carcinoma (0/1).  H. LYMPH NODE, LEVEL 7 #3, EXCISION: Lymph node negative for metastatic carcinoma (0/1).  I. LYMPH NODE, LEVEL 10, EXCISION: Lymph node negative for metastatic carcinoma (0/1).  J. LYMPH NODE, LEVEL 4R, EXCISION: Lymph node negative for metastatic carcinoma (0/1).  K. LYMPH NODE, LEVEL 4R #2, EXCISION: Lymph node negative for metastatic carcinoma (0/1).  L. LYMPH NODE, LEVEL 11 #2, EXCISION: Lymph node negative for metastatic carcinoma (0/1).  M. LYMPH NODE, LEVEL 12, EXCISION: Lymph node negative for metastatic carcinoma (0/1).  ONCOLOGY TABLE: LUNG: Resection Synchronous Tumors: Not applicable Total Number of Primary Tumors: 1 Procedure: Right upper lobectomy with lymph node biopsies Specimen Laterality: Right Tumor Focality: Unifocal Tumor Site: Right upper lobe Tumor Size: 2.2 x 1.7 x 1.3 cm Histologic Type:  Adenocarcinoma Visceral Pleura Invasion: Not identified Direct Invasion of Adjacent Structures: Not applicable Lymphovascular Invasion: Not identified Margins: Final bronchial margin positive for carcinoma. Treatment Effect: No known presurgical therapy      Percentage of Residual Viable Tumor: Not applicable Regional Lymph Nodes:      Number of Lymph Nodes Involved: 0                           Nodal Sites with Tumor: 0      Number of Lymph Nodes Examined: 11  Nodal Sites Examined: Peribronchial, 4R, 7, 9, 10, 11, 12 Distant Metastasis:      Distant Site(s) Involved: Not applicable Pathologic Stage Classification (pTNM, AJCC 8th Edition): pT2a, pN0 Ancillary Studies: Can be performed if requested Representative Tumor Block: C4 and C5 Comment(s): The carcinoma is a moderately to poorly differentiated adenocarcinoma with focal solid areas.  The carcinoma involves the main right upper lobe bronchus and invades the cartilaginous wall of the bronchus. (v4.2.0.1)     IMPRESSION/PLAN:  Ms. Gable is a 73 yo F w/ Stage IB pT2aN0 cM0 adenocarcinoma of the RUL s/p lobectomy and lymph node sampling w/ positive bronchial margin despite reresection.   Given the positive bronchial margin, the patient is at increased risk for locoregional recurrence and adjuvant radiation therapy is recommended to improve local control.  Plan for 60 Gy in 30 fractions targeting the bronchial stump and adjacent high-risk region while minimizing dose to surrounding normal tissues.  Explained that before we can begin radiation we must obtain a CT simulation scan during which the patient will be positioned reproducibly and immobilization devices may be created to ensure accuracy. The simulation CT will be used for treatment planning to precisely define the target and protect nearby organs such as the lungs, heart, esophagus and spinal cord.   We reviewed that radiation treatments are delivered  once daily, M-F, with each session lasting approximately 10-20 minutes. She can expect to be in the department approximately 45 minutes. The patient will be seen weekly during treatment to monitor side effects and address any concerns.  We reviewed potential side effects of radiation including fatigue, skin irritation, esophagitis, cough or SOB and radiation pneumonitis.  Her case will be presented at multidisciplinary thoracic tumor board to confirm consensus regarding adjuvant therapy recommendations and to ensure coordination with medical oncology and thoracic surgery.  The patient wishes to proceed. Next step is CT simulation.  Total time spent today in preparation for this visit was *** minutes. This included patient care, imaging and path review, documentation, multidisciplinary discussion and coordination of care and follow up.    Estefana HERO. Maritza, M.D.   "

## 2024-02-03 ENCOUNTER — Encounter: Payer: Self-pay | Admitting: Radiation Oncology

## 2024-02-03 ENCOUNTER — Ambulatory Visit
Admission: RE | Admit: 2024-02-03 | Discharge: 2024-02-03 | Disposition: A | Source: Ambulatory Visit | Attending: Radiation Oncology

## 2024-02-03 ENCOUNTER — Ambulatory Visit
Admission: RE | Admit: 2024-02-03 | Discharge: 2024-02-03 | Disposition: A | Source: Ambulatory Visit | Attending: Radiation Oncology | Admitting: Radiation Oncology

## 2024-02-03 VITALS — BP 162/79 | HR 97 | Temp 97.6°F | Resp 18 | Wt 214.6 lb

## 2024-02-03 VITALS — BP 162/79 | Temp 97.6°F | Resp 19 | Wt 214.6 lb

## 2024-02-03 DIAGNOSIS — Z7984 Long term (current) use of oral hypoglycemic drugs: Secondary | ICD-10-CM | POA: Insufficient documentation

## 2024-02-03 DIAGNOSIS — I2510 Atherosclerotic heart disease of native coronary artery without angina pectoris: Secondary | ICD-10-CM | POA: Diagnosis not present

## 2024-02-03 DIAGNOSIS — M1711 Unilateral primary osteoarthritis, right knee: Secondary | ICD-10-CM | POA: Insufficient documentation

## 2024-02-03 DIAGNOSIS — K760 Fatty (change of) liver, not elsewhere classified: Secondary | ICD-10-CM | POA: Insufficient documentation

## 2024-02-03 DIAGNOSIS — C3411 Malignant neoplasm of upper lobe, right bronchus or lung: Secondary | ICD-10-CM | POA: Diagnosis present

## 2024-02-03 DIAGNOSIS — I6521 Occlusion and stenosis of right carotid artery: Secondary | ICD-10-CM | POA: Insufficient documentation

## 2024-02-03 DIAGNOSIS — K449 Diaphragmatic hernia without obstruction or gangrene: Secondary | ICD-10-CM | POA: Insufficient documentation

## 2024-02-03 DIAGNOSIS — Z79899 Other long term (current) drug therapy: Secondary | ICD-10-CM | POA: Insufficient documentation

## 2024-02-03 DIAGNOSIS — E119 Type 2 diabetes mellitus without complications: Secondary | ICD-10-CM | POA: Insufficient documentation

## 2024-02-03 DIAGNOSIS — R200 Anesthesia of skin: Secondary | ICD-10-CM | POA: Diagnosis not present

## 2024-02-03 DIAGNOSIS — Z8701 Personal history of pneumonia (recurrent): Secondary | ICD-10-CM | POA: Insufficient documentation

## 2024-02-03 DIAGNOSIS — I10 Essential (primary) hypertension: Secondary | ICD-10-CM | POA: Diagnosis not present

## 2024-02-03 DIAGNOSIS — Z8673 Personal history of transient ischemic attack (TIA), and cerebral infarction without residual deficits: Secondary | ICD-10-CM | POA: Diagnosis not present

## 2024-02-03 DIAGNOSIS — Z923 Personal history of irradiation: Secondary | ICD-10-CM | POA: Diagnosis not present

## 2024-02-03 DIAGNOSIS — K219 Gastro-esophageal reflux disease without esophagitis: Secondary | ICD-10-CM | POA: Insufficient documentation

## 2024-02-03 DIAGNOSIS — R0602 Shortness of breath: Secondary | ICD-10-CM | POA: Diagnosis not present

## 2024-02-03 DIAGNOSIS — I700 Atherosclerosis of aorta: Secondary | ICD-10-CM | POA: Insufficient documentation

## 2024-02-03 DIAGNOSIS — M47814 Spondylosis without myelopathy or radiculopathy, thoracic region: Secondary | ICD-10-CM | POA: Insufficient documentation

## 2024-02-03 DIAGNOSIS — Z853 Personal history of malignant neoplasm of breast: Secondary | ICD-10-CM | POA: Insufficient documentation

## 2024-02-03 DIAGNOSIS — Z7982 Long term (current) use of aspirin: Secondary | ICD-10-CM | POA: Insufficient documentation

## 2024-02-03 DIAGNOSIS — Z803 Family history of malignant neoplasm of breast: Secondary | ICD-10-CM | POA: Diagnosis not present

## 2024-02-03 NOTE — Progress Notes (Addendum)
 Location of tumor and Histology per Pathology Report:   Biopsy:   Past/Anticipated interventions by surgeon, if any:  LOBECTOMY, LUNG, ROBOT-ASSISTED, USING VATS Right General  ROBOTIC RIGHT UPPER LOBECTOMY     Past/Anticipated interventions by medical oncology, if any: Chemotherapy   Wt Readings from Last 4 Encounters: 02/03/24         214.6 lb  01/30/24 213 lb (96.6 kg)  01/18/24 214 lb 15.2 oz (97.5 kg)  01/16/24 215 lb 6.4 oz (97.7 kg)    Pain issues, if any:  Yes, 5/10 burning and stinging pain.  SAFETY ISSUES: Prior radiation? Yes, right breast May of 2007. Pacemaker/ICD? No Possible current pregnancy? No Is the patient on methotrexate? No  Current Complaints / other details:      BP (!) 162/79   Pulse 97   Temp 97.6 F (36.4 C)   Resp 18   Wt 214 lb 9.6 oz (97.3 kg)   SpO2 99%   BMI 34.64 kg/m

## 2024-02-09 ENCOUNTER — Other Ambulatory Visit: Payer: Self-pay

## 2024-02-09 NOTE — Addendum Note (Signed)
 Encounter addended by: Claudene Eudora GAILS, LPN on: 09/02/7971 1:16 PM  Actions taken: Clinical Note Signed

## 2024-02-10 ENCOUNTER — Ambulatory Visit
Admission: RE | Admit: 2024-02-10 | Discharge: 2024-02-10 | Disposition: A | Discharge status: Home / Self Care | Source: Ambulatory Visit | Attending: Radiation Oncology | Admitting: Radiation Oncology

## 2024-02-10 DIAGNOSIS — Z510 Encounter for antineoplastic radiation therapy: Secondary | ICD-10-CM | POA: Insufficient documentation

## 2024-02-10 DIAGNOSIS — C3411 Malignant neoplasm of upper lobe, right bronchus or lung: Secondary | ICD-10-CM | POA: Insufficient documentation

## 2024-02-10 NOTE — Progress Notes (Signed)
 The proposed treatment discussed in conference is for discussion purpose only and is not a binding recommendation.  The patients have not been physically examined, or presented with their treatment options.  Therefore, final treatment plans cannot be decided.

## 2024-02-13 ENCOUNTER — Other Ambulatory Visit: Payer: Self-pay | Admitting: Thoracic Surgery (Cardiothoracic Vascular Surgery)

## 2024-02-13 DIAGNOSIS — R911 Solitary pulmonary nodule: Secondary | ICD-10-CM

## 2024-02-13 NOTE — Progress Notes (Signed)
 Subjective:   [name] is a 74 y.o. female here for follow up from recent hospital admission.  She experienced a mini stroke, which led to the discovery of a lung nodule during her subsequent medical evaluation. A PET scan revealed hypermetabolic activity, prompting the removal of the entire lobe in 01/2024. The scan indicated that the nodule was isolated to the lung. However, upon surgical intervention, 12 lymph nodes were excised, one of which was found to be cancerous. There is some discrepancy between the surgeon's and radiation oncologist's interpretation of the pathology report, with the latter suggesting that the cancer was located in the surrounding tissue rather than the lymph node itself. She is scheduled to begin radiation therapy next week, with a total of 30 treatments planned. The decision regarding chemotherapy will be made after her consultation with the oncologist on Wednesday. She reports occasional wheezing but no shortness of breath during daily activities. She has been informed that the radiation therapy could potentially be curative.  She reports no residual deficits from her stroke. Her Crestor  dosage was reduced from 20 mg to 10 mg due to joint issues that impeded her mobility. She has not had a lipid panel done since the dosage adjustment and expresses interest in having one conducted.  She was administered insulin  during her hospital stay. She continues to take metformin .  She was previously on losartan  50 mg in the morning and 25 mg at night, but this regimen was not followed during her hospital stay. Instead, she was given hydralazine  due to elevated blood pressure. She discontinued the losartan  25 mg at night two days ago after experiencing near-fainting and a blood pressure reading of 110/60, which she considers low for her.  She is requesting a refill of her Xanax  prescription to help manage stress.  Living Condition: One of her daughters has moved in with  her.  PAST SURGICAL HISTORY: - Right upper lobe lobectomy in 01/2024   Review of Systems - All other systems were reviewed and are negative unless stated in HPI.  Family History[1] Past Medical History:  Diagnosis Date   Anxiety    Arthritis    Breast cancer (*) 02/01/2005   Right   Cataracts, bilateral 2019   Colon polyp 2017   2023   GERD (gastroesophageal reflux disease)    Hypertension    Stroke (*)    Past Surgical History:  Procedure Laterality Date   Appendectomy  08/17/2013   Breast lumpectomy  02/01/2005   Right    Carpal tunnel release  02/01/2005   Colonoscopy  02/02/2003   Hysterectomy  02/02/1992   Joint replacement     Knee arthroscopy  02/01/2010   Right   Reduction mammaplasty  02/01/2009   Left   Pediatric History  Patient Parents   Not on file   Other Topics Concern   Not on file  Social History Narrative   Not on file     Objective:   BP 142/69 (BP Location: Left Upper Arm)   Pulse 80   Temp (!) 96.7 F (35.9 C) (Temporal)   Resp 16   Ht 5' 6 (1.676 m)   Wt 211 lb (95.7 kg)   SpO2 100%   BMI 34.06 kg/m  Gen: Alert, oriented, non toxic, and well hydrated.  No signs of acute distress. Head: Normocephalic.  Atraumatic.  PERRLA.  Sclera anicteric.  Eyes: Extraocular movements intact.  Conjunctiva clear.  Pharynx: No erythema or tonsillar hypertrophy.  Uvula midline. Neck: Full  range of motion, no meningmus.  No lymphadenopathy Respiratory:  Lungs clear to auscultation.  No use of accessory muscles. Cardiovascular: Regular rate and rhythm.  No murmurs noted Abdominal:  Soft, non tender, non distended.  No hepatosplenomegally Neuro: Cranial nerves intact grossly.  No loss of strength, sensation Extremities:  Full range of motion.  No cyanosis, clubbing, or edema. Skin:  No rashes noted Psych: Oriented, alert.  Assessment:     ICD-10-CM   1. Hyperlipidemia, unspecified hyperlipidemia type  E78.5 Lipid Panel With  LDL/HDL Ratio    2. Type 2 diabetes mellitus with other circulatory complication, without long-term current use of insulin  (*)  E11.59 POCT A1C    3. Situational anxiety  F41.8 ALPRAZolam  (XANAX ) 0.5 mg tablet      Plan:   - Reviewed hospital notes. 1. Lung nodule: - The pathology report indicates that the surrounding tissue and bronchial wall are positive for adenocarcinoma. - Radiation therapy was recommended by her Specialists at [facility] to eliminate any residual cancer cells. - She will start radiation therapy next week for 30 treatments. The potential need for chemotherapy will be discussed with the oncologist on Wednesday.  2. Stroke: - There are no residual deficits from the stroke. - Her Crestor  dosage was reduced from 20 mg to 10 mg due to joint pain. - A lipid panel will be ordered to assess if the dosage needs to be adjusted.  3. Blood pressure management: - Her blood pressure readings are slightly elevated systolic and low diastolic. - She reported taking losartan  inconsistently due to issues during her hospital stay. - She is advised to resume her regular losartan  regimen and monitor her blood pressure closely.  4. Medication management: - She is currently taking metformin  and pantoprazole . - An A1c test will be conducted as it has been over three months since the last test. - She is advised to continue her current medications and contact the office if she runs out of any medication.  5. Stress management: - A prescription for Xanax  will be provided to help manage stress and prevent another stroke.    - Return to clinic to be reevaluated if symptoms worsen, persist, change, or if you have any other concerns.  I discussed this diagnosis with the patient and discussed the treatment plan with them - this treatment plan is also outlined in the Patient Instructions and a copy of this was provided to the patient.   AI technology was used in creating this visit note. Verbal  consent from the patient/caregiver was obtained prior to its use.         [1] Family History Problem Relation Name Age of Onset   Arthritis Mother GMO    Colon polyps Father     Dementia Mother GMO    Diabetes Brother 2TEO    Drug abuse Brother 1RAO    Hypertension Mother GMO    No Known Problems Brother 3     Daughter age 102     Daughter age 78    Stroke Mother GMO

## 2024-02-14 ENCOUNTER — Ambulatory Visit (HOSPITAL_COMMUNITY)
Admission: RE | Admit: 2024-02-14 | Discharge: 2024-02-14 | Disposition: A | Discharge status: Home / Self Care | Payer: Self-pay | Source: Ambulatory Visit | Attending: Cardiology | Admitting: Cardiology

## 2024-02-14 ENCOUNTER — Ambulatory Visit
Payer: Self-pay | Attending: Thoracic Surgery (Cardiothoracic Vascular Surgery) | Admitting: Thoracic Surgery (Cardiothoracic Vascular Surgery)

## 2024-02-14 VITALS — BP 158/73 | HR 96 | Resp 18 | Ht 66.0 in | Wt 212.0 lb

## 2024-02-14 DIAGNOSIS — R911 Solitary pulmonary nodule: Secondary | ICD-10-CM

## 2024-02-14 DIAGNOSIS — Z09 Encounter for follow-up examination after completed treatment for conditions other than malignant neoplasm: Secondary | ICD-10-CM

## 2024-02-14 MED ORDER — ALBUTEROL SULFATE HFA 108 (90 BASE) MCG/ACT IN AERS: 2.0000 | INHALATION_SPRAY | Freq: Three times a day (TID) | RESPIRATORY_TRACT | 1 refills | Status: AC | PRN

## 2024-02-14 NOTE — Progress Notes (Signed)
 "  [address] [place] [address]             [phone]     HPI: [name] returns for follow-up after recent right upper lobectomy.  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  She presented in October with right sided facial numbness.  She did have a small left thalamic stroke.  During her workup for that she was found to have a lung nodule.  CT showed a 1.4 cm central right upper lobe nodule.  Robotic bronchoscopy was nondiagnostic.  I did a robotic assisted right upper lobectomy on 01/19/2024.  During the procedure frozens on the level 11 nodes and bronchial margin were positive.  Additional bronchial resection and a bronchoplastic closure were performed.  Unfortunately still had positive margins in the right mainstem.  I did not feel she was a candidate for a pneumonectomy given her recent stroke.  Her postoperative course was uncomplicated.  I saw her back on 01/30/2024.  She was doing well at that time.  She saw [name].  She will have radiation in the near future.  She has an appointment with [name] tomorrow.  She is feeling well.  She is not taking any narcotics for pain.  Has noted some times when her breathing feels tight.  Has noticed some wheezing.  She is not on an inhaler.  Patient Active Problem List   Diagnosis Date Noted   Status post robot-assisted surgical procedure 01/18/2024   S/P lobectomy of lung 01/18/2024   Primary cancer of right upper lobe of lung (HCC) 12/05/2023   Acute ischemic stroke (HCC) 11/03/2023   Type 2 diabetes mellitus (HCC) 11/03/2023   Primary localized osteoarthritis of right knee 12/26/2017   Stasis dermatitis of both legs 07/26/2017   Class II obesity 07/20/2017   History of appendicitis 08/17/2013   Essential hypertension 08/10/2012   Breast cancer, right breast  (HCC) 07/21/2011   History of right breast cancer 07/21/2011    Current Outpatient Medications  Medication Sig Dispense Refill   acetaminophen  (TYLENOL ) 325 MG tablet Take 2 tablets (650 mg total) by mouth every 6 (six) hours as needed for mild pain (pain score 1-3).     albuterol  (VENTOLIN  HFA) 108 (90 Base) MCG/ACT inhaler Inhale 2 puffs into the lungs every 8 (eight) hours as needed for wheezing or shortness of breath. 8 g 1   ALPRAZolam  (XANAX ) 0.5 MG tablet Take 0.25 mg by mouth daily as needed for anxiety or sleep.     aspirin  EC 81 MG tablet Take 1 tablet (81 mg total) by mouth daily. Swallow whole.     cholecalciferol (VITAMIN D3) 25 MCG (1000 UNIT) tablet Take 1,000 Units by mouth in the morning.     famotidine  (PEPCID ) 40 MG tablet Take 40 mg by mouth every evening.     furosemide  (LASIX ) 20 MG tablet Take 20 mg by mouth in the morning.     gabapentin  (NEURONTIN ) 300 MG capsule Take 1 capsule (300 mg total) by mouth 2 (two) times daily. 60 capsule 2   ibuprofen (ADVIL) 200 MG tablet Take 400 mg by mouth every 8 (eight) hours as needed (pain.).     losartan  (COZAAR ) 50 MG tablet Take 50 mg by mouth daily. (Patient taking differently: Take 25-50 mg by mouth See admin instructions. 1  am & 0.5 qpm)     Magnesium  Bisglycinate (MAG GLYCINATE PO) Take 240 mg by mouth at bedtime.     metFORMIN  (GLUCOPHAGE -XR) 500 MG 24 hr tablet Take 500 mg by mouth daily with breakfast.     metoprolol  succinate (TOPROL -XL) 50 MG 24 hr tablet Take 1 tablet (50 mg total) by mouth daily. Take with or immediately following a meal. 30 tablet 1   mometasone (ELOCON) 0.1 % cream Apply 1 Application topically daily as needed (eczema).     Multiple Vitamin (MULTIVITAMIN) tablet Take 1 tablet by mouth in the morning.     Omega-3 Fatty Acids (FISH OIL ) 1200 MG CAPS Take 2 capsules (2,400 mg total) by mouth daily.     oxyCODONE  (OXY IR/ROXICODONE ) 5 MG immediate release tablet Take 1 tablet (5 mg total) by mouth every 6  (six) hours as needed for severe pain (pain score 7-10). 28 tablet 0   pantoprazole  (PROTONIX ) 40 MG tablet Take 40 mg by mouth in the morning.     Probiotic Product (PROBIOTIC FORMULA PO) Take 1 tablet by mouth in the morning.     psyllium (REGULOID) 0.52 g capsule Take 3 capsules by mouth daily. (Patient taking differently: Take 1 capsule by mouth in the morning.)     riboflavin (VITAMIN B-2) 100 MG TABS tablet Take 100 mg by mouth in the morning.     rosuvastatin  (CRESTOR ) 10 MG tablet Take 1 tablet (10 mg total) by mouth at bedtime. 30 tablet 5   No current facility-administered medications for this visit.    Physical Exam BP (!) 158/73   Pulse 96   Resp 18   Ht 5' 6 (1.676 m)   Wt 212 lb (96.2 kg)   SpO2 96% Comment: RA  BMI 34.59 kg/m  74 year old woman in no acute distress Alert and oriented x 3 with no focal deficits Lungs clear with equal breath sounds bilaterally Incisions healing well Cardiac regular rate and rhythm  Diagnostic Tests: CHEST - 2 VIEW   COMPARISON:  01/30/2024   FINDINGS: Volume loss right lung compatible with previous right lumpectomy. Interval improved right pneumothorax with small residual apical pneumothorax visualized. Suggestion of mild residual pleural fluid over the anterior mid and upper right thorax. Left lung is clear. Cardiomediastinal silhouette and remainder of the exam is unchanged.   IMPRESSION: Interval improved right pneumothorax with small residual apical pneumothorax. Suggestion of mild residual pleural fluid over the anterior mid and upper right thorax.     Electronically Signed   By: [name] M.D.   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  Non-small cell carcinoma right upper lobe-status post right upper lobectomy with positive bronchial  margin.  Is not a candidate for a pneumonectomy.  She will receive adjuvant radiation.  She has an appoint with [name] tomorrow to see if there is a role for chemotherapy.  She has resumed most of her normal activities.  Not requiring any narcotics.  Does complain of some occasional wheezing and chest tightness.  I gave her prescription for an albuterol  inhaler as needed.  That should improve with time.  Plan: Albuterol  inhaler as needed Follow-up with [name] Return in 6 weeks with PA lateral chest x-ray  [name], MD [facility] [phone]     "

## 2024-02-15 ENCOUNTER — Inpatient Hospital Stay: Attending: Internal Medicine

## 2024-02-15 ENCOUNTER — Inpatient Hospital Stay (HOSPITAL_BASED_OUTPATIENT_CLINIC_OR_DEPARTMENT_OTHER): Admitting: Internal Medicine

## 2024-02-15 VITALS — BP 141/60 | HR 99 | Temp 97.6°F | Resp 17 | Ht 66.0 in | Wt 210.5 lb

## 2024-02-15 DIAGNOSIS — Z800 Family history of malignant neoplasm of digestive organs: Secondary | ICD-10-CM | POA: Insufficient documentation

## 2024-02-15 DIAGNOSIS — Z853 Personal history of malignant neoplasm of breast: Secondary | ICD-10-CM | POA: Insufficient documentation

## 2024-02-15 DIAGNOSIS — Z923 Personal history of irradiation: Secondary | ICD-10-CM | POA: Insufficient documentation

## 2024-02-15 DIAGNOSIS — C3411 Malignant neoplasm of upper lobe, right bronchus or lung: Secondary | ICD-10-CM | POA: Insufficient documentation

## 2024-02-15 DIAGNOSIS — C3490 Malignant neoplasm of unspecified part of unspecified bronchus or lung: Secondary | ICD-10-CM | POA: Diagnosis not present

## 2024-02-15 DIAGNOSIS — Z803 Family history of malignant neoplasm of breast: Secondary | ICD-10-CM | POA: Insufficient documentation

## 2024-02-15 DIAGNOSIS — Z902 Acquired absence of lung [part of]: Secondary | ICD-10-CM | POA: Insufficient documentation

## 2024-02-15 DIAGNOSIS — R911 Solitary pulmonary nodule: Secondary | ICD-10-CM

## 2024-02-15 LAB — CBC WITH DIFFERENTIAL (CANCER CENTER ONLY)
Abs Immature Granulocytes: 0.02 K/uL (ref 0.00–0.07)
Basophils Absolute: 0.1 K/uL (ref 0.0–0.1)
Basophils Relative: 1 %
Eosinophils Absolute: 0.3 K/uL (ref 0.0–0.5)
Eosinophils Relative: 4 %
HCT: 37.1 % (ref 36.0–46.0)
Hemoglobin: 12.8 g/dL (ref 12.0–15.0)
Immature Granulocytes: 0 %
Lymphocytes Relative: 21 %
Lymphs Abs: 1.5 K/uL (ref 0.7–4.0)
MCH: 30.3 pg (ref 26.0–34.0)
MCHC: 34.5 g/dL (ref 30.0–36.0)
MCV: 87.9 fL (ref 80.0–100.0)
Monocytes Absolute: 0.7 K/uL (ref 0.1–1.0)
Monocytes Relative: 9 %
Neutro Abs: 4.8 K/uL (ref 1.7–7.7)
Neutrophils Relative %: 65 %
Platelet Count: 280 K/uL (ref 150–400)
RBC: 4.22 MIL/uL (ref 3.87–5.11)
RDW: 14.4 % (ref 11.5–15.5)
WBC Count: 7.4 K/uL (ref 4.0–10.5)
nRBC: 0 % (ref 0.0–0.2)

## 2024-02-15 LAB — CMP (CANCER CENTER ONLY)
ALT: 25 U/L (ref 0–44)
AST: 27 U/L (ref 15–41)
Albumin: 4.5 g/dL (ref 3.5–5.0)
Alkaline Phosphatase: 71 U/L (ref 38–126)
Anion gap: 12 (ref 5–15)
BUN: 12 mg/dL (ref 8–23)
CO2: 29 mmol/L (ref 22–32)
Calcium: 9.9 mg/dL (ref 8.9–10.3)
Chloride: 98 mmol/L (ref 98–111)
Creatinine: 0.66 mg/dL (ref 0.44–1.00)
GFR, Estimated: >60 mL/min
Glucose, Bld: 135 mg/dL — ABNORMAL HIGH (ref 70–99)
Potassium: 3.7 mmol/L (ref 3.5–5.1)
Sodium: 139 mmol/L (ref 135–145)
Total Bilirubin: 0.6 mg/dL (ref 0.0–1.2)
Total Protein: 7.3 g/dL (ref 6.5–8.1)

## 2024-02-15 NOTE — Progress Notes (Signed)
 "     [facility] [phone]   [phone]  OFFICE PROGRESS NOTE  Alison Aldona CROME, NP [phone] [place] [address]  DIAGNOSIS: Stage Ib (T2a, M0, M0) non-small cell lung cancer, adenocarcinoma presented with right upper lobe lung nodule diagnosed in November 2025  PRIOR THERAPY: Status post robotic assisted right upper lobectomy with lymph node dissection under the care of Dr. Kerrin on January 18, 2024.  CURRENT THERAPY: Observation.  INTERVAL HISTORY: [name] 74 y.o. female returns to the clinic today for follow-up visit. Discussed the use of AI scribe software for clinical note transcription with the patient, who gave verbal consent to proceed.  History of Present Illness [name] is a 74 year old female with stage IB right upper lobe lung adenocarcinoma, status post right upper lobectomy, presenting for post-operative evaluation.  Diagnosed with stage IB (T2A N0 M0) non-small cell lung adenocarcinoma in November 2025, she underwent right upper lobectomy with lymph node dissection on January 18, 2024. The tumor measured 2.2 cm and was located near the right main stem bronchus. Pathology revealed no lymph node involvement or evidence of invasion, though fibrous tissue was noted around one lymph node.  The patient denies significant right-sided pain following surgery. She describes mild neuropathic pain localized to the anterior chest, but denies numbness, paresthesia, or other new symptoms.  She reports no additional issues or symptoms.     MEDICAL HISTORY: Past Medical History:  Diagnosis Date   Arthritis    Breast cancer (HCC)    Breast cancer, right breast (HCC) 07/21/2011   Diabetes mellitus without complication (HCC)    GERD (gastroesophageal reflux disease)    Hypertension    Personal history of radiation therapy 2007   Pneumonia 1988   h/o   PONV (postoperative nausea and vomiting)    Primary  localized osteoarthritis of right knee 12/26/2017   Stroke (HCC)     ALLERGIES:  has no known allergies.  MEDICATIONS:  Current Outpatient Medications  Medication Sig Dispense Refill   acetaminophen  (TYLENOL ) 325 MG tablet Take 2 tablets (650 mg total) by mouth every 6 (six) hours as needed for mild pain (pain score 1-3).     albuterol  (VENTOLIN  HFA) 108 (90 Base) MCG/ACT inhaler Inhale 2 puffs into the lungs every 8 (eight) hours as needed for wheezing or shortness of breath. 8 g 1   ALPRAZolam  (XANAX ) 0.5 MG tablet Take 0.25 mg by mouth daily as needed for anxiety or sleep.     aspirin  EC 81 MG tablet Take 1 tablet (81 mg total) by mouth daily. Swallow whole.     cholecalciferol (VITAMIN D3) 25 MCG (1000 UNIT) tablet Take 1,000 Units by mouth in the morning.     famotidine  (PEPCID ) 40 MG tablet Take 40 mg by mouth every evening.     furosemide  (LASIX ) 20 MG tablet Take 20 mg by mouth in the morning.     gabapentin  (NEURONTIN ) 300 MG capsule Take 1 capsule (300 mg total) by mouth 2 (two) times daily. 60 capsule 2   ibuprofen (ADVIL) 200 MG tablet Take 400 mg by mouth every 8 (eight) hours as needed (pain.).     losartan  (COZAAR ) 50 MG tablet Take 50 mg by mouth daily. (Patient taking differently: Take 25-50 mg by mouth See admin instructions. 1 am & 0.5 qpm)     Magnesium  Bisglycinate (MAG GLYCINATE PO) Take 240 mg by mouth at bedtime.     metFORMIN  (  GLUCOPHAGE -XR) 500 MG 24 hr tablet Take 500 mg by mouth daily with breakfast.     metoprolol  succinate (TOPROL -XL) 50 MG 24 hr tablet Take 1 tablet (50 mg total) by mouth daily. Take with or immediately following a meal. 30 tablet 1   mometasone (ELOCON) 0.1 % cream Apply 1 Application topically daily as needed (eczema).     Multiple Vitamin (MULTIVITAMIN) tablet Take 1 tablet by mouth in the morning.     Omega-3 Fatty Acids (FISH OIL ) 1200 MG CAPS Take 2 capsules (2,400 mg total) by mouth daily.     oxyCODONE  (OXY IR/ROXICODONE ) 5 MG immediate  release tablet Take 1 tablet (5 mg total) by mouth every 6 (six) hours as needed for severe pain (pain score 7-10). 28 tablet 0   pantoprazole  (PROTONIX ) 40 MG tablet Take 40 mg by mouth in the morning.     Probiotic Product (PROBIOTIC FORMULA PO) Take 1 tablet by mouth in the morning.     psyllium (REGULOID) 0.52 g capsule Take 3 capsules by mouth daily. (Patient taking differently: Take 1 capsule by mouth in the morning.)     riboflavin (VITAMIN B-2) 100 MG TABS tablet Take 100 mg by mouth in the morning.     rosuvastatin  (CRESTOR ) 10 MG tablet Take 1 tablet (10 mg total) by mouth at bedtime. 30 tablet 5   No current facility-administered medications for this visit.    SURGICAL HISTORY:  Past Surgical History:  Procedure Laterality Date   ABDOMINAL HYSTERECTOMY  1994   ANAL FISSURE REPAIR  1999   APPENDECTOMY  2015   BREAST BIOPSY Right 03/05/2005   BREAST LUMPECTOMY Right 03/25/2005   BREAST REDUCTION SURGERY  2011   BRONCHIAL BIOPSY  12/12/2023   Procedure: BRONCHOSCOPY, WITH BIOPSY;  Surgeon: Zaida Zola SAILOR, MD;  Location: [facility];  Service: Pulmonary;;   BRONCHIAL NEEDLE ASPIRATION BIOPSY  12/12/2023   Procedure: BRONCHOSCOPY, WITH NEEDLE ASPIRATION BIOPSY;  Surgeon: Zaida Zola SAILOR, MD;  Location: [facility];  Service: Pulmonary;;   CARPAL TUNNEL RELEASE Bilateral    INTERCOSTAL NERVE BLOCK Right 01/18/2024   Procedure: BLOCK, NERVE, INTERCOSTAL;  Surgeon: Kerrin Elspeth BROCKS, MD;  Location: [facility] OR;  Service: Thoracic;  Laterality: Right;   KNEE ARTHROSCOPY Right    2013,2018   LOBECTOMY, LUNG, ROBOT-ASSISTED, USING VATS Right 01/18/2024   Procedure: LOBECTOMY, LUNG, ROBOT-ASSISTED, USING VATS;  Surgeon: Kerrin Elspeth BROCKS, MD;  Location: [facility] OR;  Service: Thoracic;  Laterality: Right;  ROBOTIC RIGHT UPPER LOBECTOMY   LYMPH NODE BIOPSY Right 01/18/2024   Procedure: LYMPH NODE BIOPSY;  Surgeon: Kerrin Elspeth BROCKS, MD;  Location: [facility] OR;  Service: Thoracic;   Laterality: Right;   REDUCTION MAMMAPLASTY     TOTAL KNEE ARTHROPLASTY Right 12/26/2017   Procedure: RIGHT TOTAL KNEE ARTHROPLASTY;  Surgeon: Jane Charleston, MD;  Location: [facility] OR;  Service: Orthopedics;  Laterality: Right;   VIDEO BRONCHOSCOPY WITH ENDOBRONCHIAL NAVIGATION Right 12/12/2023   Procedure: VIDEO BRONCHOSCOPY WITH ENDOBRONCHIAL NAVIGATION;  Surgeon: Zaida Zola SAILOR, MD;  Location: [facility];  Service: Pulmonary;  Laterality: Right;   VIDEO BRONCHOSCOPY WITH ENDOBRONCHIAL ULTRASOUND  12/12/2023   Procedure: BRONCHOSCOPY, WITH EBUS;  Surgeon: Zaida Zola SAILOR, MD;  Location: [facility];  Service: Pulmonary;;    REVIEW OF SYSTEMS:  Constitutional: negative Eyes: negative Ears, nose, mouth, throat, and face: negative Respiratory: positive for pleurisy/chest pain Cardiovascular: negative Gastrointestinal: negative Genitourinary:negative Integument/breast: negative Hematologic/lymphatic: negative Musculoskeletal:negative Neurological: negative Behavioral/Psych: negative Endocrine: negative Allergic/Immunologic: negative   PHYSICAL EXAMINATION: General  appearance: alert, cooperative, appears stated age, and fatigued Head: Normocephalic, without obvious abnormality, atraumatic Neck: no adenopathy, no JVD, supple, symmetrical, trachea midline, and thyroid  not enlarged, symmetric, no tenderness/mass/nodules Lymph nodes: Cervical, supraclavicular, and axillary nodes normal. Resp: clear to auscultation bilaterally Back: symmetric, no curvature. ROM normal. No CVA tenderness. Cardio: regular rate and rhythm, S1, S2 normal, no murmur, click, rub or gallop GI: soft, non-tender; bowel sounds normal; no masses,  no organomegaly Extremities: extremities normal, atraumatic, no cyanosis or edema Neurologic: Alert and oriented X 3, normal strength and tone. Normal symmetric reflexes. Normal coordination and gait  ECOG PERFORMANCE STATUS: 1 - Symptomatic but completely  ambulatory  Blood pressure (!) 141/60, pulse 99, temperature 97.6 F (36.4 C), temperature source Temporal, resp. rate 17, height 5' 6 (1.676 m), weight 210 lb 8 oz (95.5 kg), SpO2 96%.  LABORATORY DATA: Lab Results  Component Value Date   WBC 7.4 02/15/2024   HGB 12.8 02/15/2024   HCT 37.1 02/15/2024   MCV 87.9 02/15/2024   PLT 280 02/15/2024      Chemistry      Component Value Date/Time   NA 139 02/15/2024 1016   K 3.7 02/15/2024 1016   CL 98 02/15/2024 1016   CO2 29 02/15/2024 1016   BUN 12 02/15/2024 1016   CREATININE 0.66 02/15/2024 1016      Component Value Date/Time   CALCIUM  9.9 02/15/2024 1016   ALKPHOS 71 02/15/2024 1016   AST 27 02/15/2024 1016   ALT 25 02/15/2024 1016   BILITOT 0.6 02/15/2024 1016       RADIOGRAPHIC STUDIES:   ASSESSMENT AND PLAN: This is a very pleasant 74 years old white female with tage Ib (T2a, M0, M0) non-small cell lung cancer, adenocarcinoma presented with right upper lobe lung nodule diagnosed in November 2025 She is status post robotic assisted right upper lobectomy with lymph node dissection under the care of Dr. Kerrin on January 18, 2024. The patient is currently on observation. Assessment and Plan Assessment & Plan Stage IB right upper lobe lung adenocarcinoma, status post right upper lobectomy She is recovering well following right upper lobectomy and lymph node dissection for stage IB lung adenocarcinoma. The tumor measured 2.2 cm (T2A, due to proximity to the right main stem bronchus), with no lymph node involvement or evidence of invasion. No adjuvant therapy is indicated given tumor size and absence of nodal disease. She remains asymptomatic except for mild, improving anterior chest neuropathic pain. Five-year survival for stage IB disease is approximately 60-80%, with mortality more often related to age-associated comorbidities than cancer recurrence. Her prior breast cancer is unrelated to the current diagnosis. -  Provided education regarding prognosis and recurrence risk. - Clarified that lung adenocarcinoma is unrelated to prior breast cancer. - Ordered chest CT one week prior to each follow-up visit. - Scheduled surveillance visits every 6 months for 2 years, then annually thereafter. - Will assess for recurrence and monitor for new symptoms at each visit. She was advised to call immediately if she has any concerning symptoms in the interval. The patient voices understanding of current disease status and treatment options and is in agreement with the current care plan.  All questions were answered. The patient knows to call the clinic with any problems, questions or concerns. We can certainly see the patient much sooner if necessary.  The total time spent in the appointment was 30 minutes including review of chart and various tests results, discussions about plan of care and coordination  of care plan .   Disclaimer: This note was dictated with voice recognition software. Similar sounding words can inadvertently be transcribed and may not be corrected upon review.        "

## 2024-02-23 ENCOUNTER — Other Ambulatory Visit: Payer: Self-pay

## 2024-02-23 ENCOUNTER — Ambulatory Visit
Admission: RE | Admit: 2024-02-23 | Discharge: 2024-02-23 | Disposition: A | Discharge status: Home / Self Care | Source: Ambulatory Visit | Attending: Radiation Oncology | Admitting: Radiation Oncology

## 2024-02-23 LAB — RAD ONC ARIA SESSION SUMMARY
Course Elapsed Days: 0
Plan Fractions Treated to Date: 1
Plan Prescribed Dose Per Fraction: 2 Gy
Plan Total Fractions Prescribed: 30
Plan Total Prescribed Dose: 60 Gy
Reference Point Dosage Given to Date: 2 Gy
Reference Point Session Dosage Given: 2 Gy
Session Number: 1

## 2024-02-24 ENCOUNTER — Ambulatory Visit
Admission: RE | Admit: 2024-02-24 | Discharge: 2024-02-24 | Disposition: A | Source: Ambulatory Visit | Attending: Radiation Oncology

## 2024-02-24 ENCOUNTER — Other Ambulatory Visit: Payer: Self-pay

## 2024-02-24 LAB — RAD ONC ARIA SESSION SUMMARY
Course Elapsed Days: 1
Plan Fractions Treated to Date: 2
Plan Prescribed Dose Per Fraction: 2 Gy
Plan Total Fractions Prescribed: 30
Plan Total Prescribed Dose: 60 Gy
Reference Point Dosage Given to Date: 4 Gy
Reference Point Session Dosage Given: 2 Gy
Session Number: 2

## 2024-02-27 ENCOUNTER — Ambulatory Visit

## 2024-02-28 ENCOUNTER — Ambulatory Visit
Admission: RE | Admit: 2024-02-28 | Discharge: 2024-02-28 | Disposition: A | Source: Ambulatory Visit | Attending: Radiation Oncology

## 2024-02-28 ENCOUNTER — Other Ambulatory Visit: Payer: Self-pay

## 2024-02-28 LAB — RAD ONC ARIA SESSION SUMMARY
Course Elapsed Days: 5
Plan Fractions Treated to Date: 3
Plan Prescribed Dose Per Fraction: 2 Gy
Plan Total Fractions Prescribed: 30
Plan Total Prescribed Dose: 60 Gy
Reference Point Dosage Given to Date: 6 Gy
Reference Point Session Dosage Given: 2 Gy
Session Number: 3

## 2024-02-29 ENCOUNTER — Ambulatory Visit

## 2024-02-29 ENCOUNTER — Other Ambulatory Visit: Payer: Self-pay

## 2024-02-29 ENCOUNTER — Ambulatory Visit
Admission: RE | Admit: 2024-02-29 | Discharge: 2024-02-29 | Disposition: A | Source: Ambulatory Visit | Attending: Radiation Oncology

## 2024-02-29 LAB — RAD ONC ARIA SESSION SUMMARY
Course Elapsed Days: 6
Plan Fractions Treated to Date: 4
Plan Prescribed Dose Per Fraction: 2 Gy
Plan Total Fractions Prescribed: 30
Plan Total Prescribed Dose: 60 Gy
Reference Point Dosage Given to Date: 8 Gy
Reference Point Session Dosage Given: 2 Gy
Session Number: 4

## 2024-03-01 ENCOUNTER — Encounter: Payer: Self-pay | Admitting: Genetic Counselor

## 2024-03-01 ENCOUNTER — Inpatient Hospital Stay: Admitting: Genetic Counselor

## 2024-03-01 ENCOUNTER — Other Ambulatory Visit: Payer: Self-pay

## 2024-03-01 ENCOUNTER — Ambulatory Visit

## 2024-03-01 ENCOUNTER — Inpatient Hospital Stay

## 2024-03-01 ENCOUNTER — Ambulatory Visit: Payer: Self-pay | Admitting: Genetic Counselor

## 2024-03-01 ENCOUNTER — Ambulatory Visit
Admission: RE | Admit: 2024-03-01 | Discharge: 2024-03-01 | Disposition: A | Discharge status: Home / Self Care | Source: Ambulatory Visit | Attending: Radiation Oncology | Admitting: Radiation Oncology

## 2024-03-01 DIAGNOSIS — C3411 Malignant neoplasm of upper lobe, right bronchus or lung: Secondary | ICD-10-CM

## 2024-03-01 DIAGNOSIS — C50911 Malignant neoplasm of unspecified site of right female breast: Secondary | ICD-10-CM

## 2024-03-01 DIAGNOSIS — Z853 Personal history of malignant neoplasm of breast: Secondary | ICD-10-CM

## 2024-03-01 DIAGNOSIS — Z803 Family history of malignant neoplasm of breast: Secondary | ICD-10-CM | POA: Insufficient documentation

## 2024-03-01 DIAGNOSIS — Z800 Family history of malignant neoplasm of digestive organs: Secondary | ICD-10-CM | POA: Insufficient documentation

## 2024-03-01 LAB — RAD ONC ARIA SESSION SUMMARY
Course Elapsed Days: 7
Plan Fractions Treated to Date: 5
Plan Prescribed Dose Per Fraction: 2 Gy
Plan Total Fractions Prescribed: 30
Plan Total Prescribed Dose: 60 Gy
Reference Point Dosage Given to Date: 10 Gy
Reference Point Session Dosage Given: 2 Gy
Session Number: 5

## 2024-03-01 LAB — GENETIC SCREENING ORDER

## 2024-03-01 MED ORDER — SONAFINE EX EMUL
1.0000 | Freq: Two times a day (BID) | CUTANEOUS | Status: DC
  Administered 2024-03-01: 1 via TOPICAL

## 2024-03-01 NOTE — Progress Notes (Signed)
 Pt here for patient teaching. Pt given Radiation and You booklet, skin care instructions, and Sonafine. Reviewed areas of pertinence such as fatigue, skin changes, cough, and shortness of breath . Pt able to give teach back of to pat skin, use unscented/gentle soap, and drink plenty of water,apply Sonafine bid and avoid applying anything to skin within 4 hours of treatment. Pt verbalizes understanding of information given and will contact nursing with any questions or concerns.     [email or web address]

## 2024-03-01 NOTE — Progress Notes (Signed)
 REFERRING PROVIDER: Catherine Cools, MD [address] [place],  [place] [address]  PRIMARY PROVIDER:  Teresa Aldona CROME, NP  PRIMARY REASON FOR VISIT:  1. Family history of colon cancer   2. Family history of breast cancer   3. Family history of pancreatic cancer   4. History of right breast cancer   5. Primary cancer of right upper lobe of lung (HCC)      HISTORY OF PRESENT ILLNESS:   Ms. Cura, a 74 y.o. female, was seen for a [place] cancer genetics consultation at the request of Dr. Catherine due to a personal and family history of breast cancer.  Ms. Wojdyla presents to clinic today to discuss the possibility of a hereditary predisposition to cancer, genetic testing, and to further clarify her future cancer risks, as well as potential cancer risks for family members.   In 2007, at the age of 37, Ms. Lansberry was diagnosed with cancer of the right breast. The treatment plan included lumpectomy, radiation and tamoxifen.  She did not have genetic testing at that time.    CANCER HISTORY:  Oncology History   No problem history exists.     RISK FACTORS:  Menarche was at age 33.  First live birth at age 60.  OCP use for approximately 10 years.  Ovaries intact: yes.  Hysterectomy: yes.  Menopausal status: postmenopausal.  HRT use: 0 years. Colonoscopy: yes; approximately 5 polyps. Mammogram within the last year: yes. Number of breast biopsies: 1. Up to date with pelvic exams: n/a. Any excessive radiation exposure in the past: no  Past Medical History:  Diagnosis Date   Arthritis    Breast cancer (HCC)    Breast cancer, right breast (HCC) 07/21/2011   Diabetes mellitus without complication (HCC)    Family history of breast cancer    Family history of colon cancer    Family history of pancreatic cancer    GERD (gastroesophageal reflux disease)    Hypertension    Personal history of radiation therapy 2007   Pneumonia 1988   h/o   PONV (postoperative  nausea and vomiting)    Primary localized osteoarthritis of right knee 12/26/2017   Stroke [facility])     Past Surgical History:  Procedure Laterality Date   ABDOMINAL HYSTERECTOMY  1994   ANAL FISSURE REPAIR  1999   APPENDECTOMY  2015   BREAST BIOPSY Right 03/05/2005   BREAST LUMPECTOMY Right 03/25/2005   BREAST REDUCTION SURGERY  2011   BRONCHIAL BIOPSY  12/12/2023   Procedure: BRONCHOSCOPY, WITH BIOPSY;  Surgeon: Zaida Zola SAILOR, MD;  Location: [facility];  Service: Pulmonary;;   BRONCHIAL NEEDLE ASPIRATION BIOPSY  12/12/2023   Procedure: BRONCHOSCOPY, WITH NEEDLE ASPIRATION BIOPSY;  Surgeon: Zaida Zola SAILOR, MD;  Location: [facility];  Service: Pulmonary;;   CARPAL TUNNEL RELEASE Bilateral    INTERCOSTAL NERVE BLOCK Right 01/18/2024   Procedure: BLOCK, NERVE, INTERCOSTAL;  Surgeon: Kerrin Elspeth BROCKS, MD;  Location: [facility] OR;  Service: Thoracic;  Laterality: Right;   KNEE ARTHROSCOPY Right    2013,2018   LOBECTOMY, LUNG, ROBOT-ASSISTED, USING VATS Right 01/18/2024   Procedure: LOBECTOMY, LUNG, ROBOT-ASSISTED, USING VATS;  Surgeon: Kerrin Elspeth BROCKS, MD;  Location: [facility] OR;  Service: Thoracic;  Laterality: Right;  ROBOTIC RIGHT UPPER LOBECTOMY   LYMPH NODE BIOPSY Right 01/18/2024   Procedure: LYMPH NODE BIOPSY;  Surgeon: Kerrin Elspeth BROCKS, MD;  Location: [facility] OR;  Service: Thoracic;  Laterality: Right;   REDUCTION MAMMAPLASTY     TOTAL  KNEE ARTHROPLASTY Right 12/26/2017   Procedure: RIGHT TOTAL KNEE ARTHROPLASTY;  Surgeon: Jane Charleston, MD;  Location[facility];  Service: Orthopedics;  Laterality: Right;   VIDEO BRONCHOSCOPY WITH ENDOBRONCHIAL NAVIGATION Right 12/12/2023   Procedure: VIDEO BRONCHOSCOPY WITH ENDOBRONCHIAL NAVIGATION;  Surgeon: Zaida Zola SAILOR, MD;  Location: [facility];  Service: Pulmonary;  Laterality: Right;   VIDEO BRONCHOSCOPY WITH ENDOBRONCHIAL ULTRASOUND  12/12/2023   Procedure: BRONCHOSCOPY, WITH EBUS;  Surgeon: Zaida Zola SAILOR, MD;  Location: [facility]  ENDOS[facility];  Service: Pulmonary;;    Social History   Socioeconomic History   Marital status: Widowed    Spouse name: Not on file   Number of children: Not on file   Years of education: Not on file   Highest education level: Not on file  Occupational History   Not on file  Tobacco Use   Smoking status: Never   Smokeless tobacco: Never  Vaping Use   Vaping status: Never Used  Substance and Sexual Activity   Alcohol use: Not Currently    Comment: rarely   Drug use: No   Sexual activity: Not on file  Other Topics Concern   Not on file  Social History Narrative   2 cups of coffee daily and a glass of soda    Social Drivers of Health   Tobacco Use: Low Risk (02/13/2024)   Received from [facility]   Patient History    Smoking Tobacco Use: Never    Smokeless Tobacco Use: Never    Passive Exposure: Never  Financial Resource Strain: Low Risk (02/13/2024)   Received from [facility]   Overall Financial Resource Strain (CARDIA)    How hard is it for you to pay for the very basics like food, housing, medical care, and heating?: Not hard at all  Food Insecurity: No Food Insecurity (02/13/2024)   Received from [facility]   Epic    Within the past 12 months, you worried that your food would run out before you got the money to buy more.: Never true    Within the past 12 months, the food you bought just didn't last and you didn't have money to get more.: Never true  Transportation Needs: No Transportation Needs (02/13/2024)   Received from [facility]    In the past 12 months, has lack of transportation kept you from medical appointments or from getting medications?: No    In the past 12 months, has lack of transportation kept you from meetings, work, or from getting things needed for daily living?: No  Physical Activity: Insufficiently Active (04/18/2023)   Received from [facility]   Exercise Vital Sign    On average, how many days per week do you engage in moderate  to strenuous exercise (like a brisk walk)?: 2 days    On average, how many minutes do you engage in exercise at this level?: 10 min  Stress: Stress Concern Present (04/18/2023)   Received from [facility] Occupational Health - Occupational Stress Questionnaire    Feeling of Stress : To some extent  Social Connections: Moderately Integrated (01/19/2024)   Social Connection and Isolation Panel    Frequency of Communication with Friends and Family: Three times a week    Frequency of Social Gatherings with Friends and Family: Three times a week    Attends Religious Services: 1 to 4 times per year    Active Member of Clubs or Organizations: Yes    Attends [employer]  or Organization Meetings: 1 to 4 times per year    Marital Status: Widowed  Depression (PHQ2-9): Low Risk (02/03/2024)   Depression (PHQ2-9)    PHQ-2 Score: 0  Alcohol Screen: Not on file  Housing: Low Risk (02/13/2024)   [facility] from [facility]    In the last 12 months, was there a time when you were not able to pay the mortgage or rent on time?: No    In the past 12 months, how many times have you moved where you were living?: 0    At any time in the past 12 months, were you homeless or living in a shelter (including now)?: No  Utilities: Not At Risk (02/13/2024)   Received from [facility]    In the past 12 months has the electric, gas, oil, or water company threatened to shut off services in your home?: No  Health Literacy: Not on file     FAMILY HISTORY:  We obtained a detailed, 4-generation family history.  Significant diagnoses are listed below: Family History  Problem Relation Age of Onset   Stroke Mother    Congestive Heart Failure Father    Lung cancer Maternal Uncle        both heavy smokers   Breast cancer Paternal Aunt        Dx > 50   Breast cancer Paternal Aunt        dx > 50   Breast cancer Paternal Aunt        dx > 50   Colon cancer Paternal Aunt        dx > 50    Colon cancer Paternal Aunt        dx > 50   Lung cancer Paternal Uncle        hx of smoking   Stroke Maternal Grandmother    Hodgkin's lymphoma Niece/Nephew 28   Leukemia Paternal Cousin 19   Seizures Neg Hx    Migraines Neg Hx    Sleep apnea Neg Hx      Significant family history reported include:  Nephew with Hodgkin's lymphoma at 50  Maternal side: Two uncles with lung cancer and history of heavy smoking Grandmother with pancreatic cancer  Paternal side: Two aunts with colon cancer over age 25 Three aunts with breast cancer over age 87 Cousin with leukemia at 6  Ms. Hannibal is unaware of previous family history of genetic testing for hereditary cancer risks.  There is no reported Ashkenazi Jewish ancestry. There is no known consanguinity.  GENETIC COUNSELING ASSESSMENT: Ms. Coulibaly is a 74 y.o. female with a personal and family history of cancer which is somewhat suggestive of a hereditary cancer syndrome and predisposition to cancer given the number of women in the family with breast cancer. We, therefore, discussed and recommended the following at today's visit.   DISCUSSION: We discussed that, in general, most cancer is not inherited in families, but instead is sporadic or familial. Sporadic cancers occur by chance and typically happen at older ages (>50 years) as this type of cancer is caused by genetic changes acquired during an individuals lifetime. Some families have more cancers than would be expected by chance; however, the ages or types of cancer are not consistent with a known genetic mutation or known genetic mutations have been ruled out. This type of familial cancer is thought to be due to a combination of multiple genetic, environmental, hormonal, and lifestyle factors. While this combination  of factors likely increases the risk of cancer, the exact source of this risk is not currently identifiable or testable.  We discussed that 5 - 10% of breast cancer is hereditary,  with most cases associated with BRCA mutations.  There are other genes that can be associated with hereditary breast cancer syndromes.  These include ATM, CHEK2 and PALB2.  We discussed that testing is beneficial for several reasons including knowing how to follow individuals after completing their treatment, identifying whether potential treatment options such as PARP inhibitors would be beneficial, and understand if other family members could be at risk for cancer and allow them to undergo genetic testing.   We reviewed the characteristics, features and inheritance patterns of hereditary cancer syndromes. We also discussed genetic testing, including the appropriate family members to test, the process of testing, insurance coverage and turn-around-time for results. We discussed the implications of a negative, positive, carrier and/or variant of uncertain significant result.  Ms. Chura decided to pursue genetic testing for the CancerNext-Expanded+RNAinsight gene panel.   The CancerNext-Expanded gene panel offered by [facility] and includes sequencing, rearrangement, and RNA analysis for the following 77 genes: AIP, ALK, APC, ATM, BAP1, BARD1, BMPR1A, BRCA1, BRCA2, BRIP1, CDC73, CDH1, CDK4, CDKN1B, CDKN2A, CEBPA, CHEK2, CTNNA1, DDX41, DICER1, ETV6, FH, FLCN, GATA2, LZTR1, MAX, MBD4, MEN1, MET, MLH1, MSH2, MSH3, MSH6, MUTYH, NF1, NF2, NTHL1, PALB2, PHOX2B, PMS2, POT1, PRKAR1A, PTCH1, PTEN, RAD51C, RAD51D, RB1, RET, RPS20, RUNX1, SDHA, SDHAF2, SDHB, SDHC, SDHD, SMAD4, SMARCA4, SMARCB1, SMARCE1, STK11, SUFU, TMEM127, TP53, TSC1, TSC2, VHL, and WT1 (sequencing and deletion/duplication); AXIN2, CTNNA1, DDX41, EGFR, HOXB13, KIT, MBD4, MITF, MSH3, PDGFRA, POLD1 and POLE (sequencing only); EPCAM and GREM1 (deletion/duplication only). RNA data is routinely analyzed for use in variant interpretation for all genes.   Based on Ms. Holroyd's personal and family history of cancer, she meets medical criteria for genetic  testing.  Despite that she meets criteria, she may still have an out of pocket cost. We discussed that if her out of pocket cost for testing is over $100, the laboratory will call and confirm whether she wants to proceed with testing.  If the out of pocket cost of testing is less than $100 she will be billed by the genetic testing laboratory.   We discussed that some people do not want to undergo genetic testing due to fear of genetic discrimination.  The Genetic Information Nondiscrimination Act (GINA) was signed into federal law in 2008. GINA prohibits health insurers and most employers from discriminating against individuals based on genetic information (including the results of genetic tests and family history information). According to GINA, health insurance companies cannot consider genetic information to be a preexisting condition, nor can they use it to make decisions regarding coverage or rates. GINA also makes it illegal for most employers to use genetic information in making decisions about hiring, firing, promotion, or terms of employment. It is important to note that GINA does not offer protections for life insurance, disability insurance, or long-term care insurance. GINA does not apply to those in the [employer], those who work for companies with less than 15 employees, and new life insurance or long-term disability insurance policies.  Health status due to a cancer diagnosis is not protected under GINA. More information about GINA can be found by visiting eliteclients.be.  PLAN: After considering the risks, benefits, and limitations, Ms. Sedore provided informed consent to pursue genetic testing and the blood sample was sent to [facility] for analysis of the CancerNext-Expanded+RNAinsight. Results  should be available within approximately 2-3 weeks' time, at which point they will be disclosed by telephone to Ms. Deshong, as will any additional recommendations warranted by these  results. Ms. Emami will receive a summary of her genetic counseling visit and a copy of her results once available. This information will also be available in Epic.   Lastly, we encouraged Ms. Jeng to remain in contact with cancer genetics annually so that we can continuously update the family history and inform her of any changes in cancer genetics and testing that may be of benefit for this family.   Ms. Wearing questions were answered to her satisfaction today. Our contact information was provided should additional questions or concerns arise. Thank you for the referral and allowing us  to share in the care of your patient.   Shari Natt P. Perri, MS, CGC Licensed, Patent Attorney [email or web address] phone: [phone]  I personally spent a total of 47 minutes in the care of the patient today including preparing to see the patient, getting/reviewing separately obtained history, counseling and educating, placing orders, referring and communicating with other health care professionals, and documenting clinical information in the EHR. The patient was seen alone.  Drs. Lanny Stalls, and/or Gudena were available for questions, if needed..    _______________________________________________________________________ For Office Staff:  Number of people involved in session: 1 Was an Intern/ student involved with case: no

## 2024-03-02 ENCOUNTER — Ambulatory Visit
Admission: RE | Admit: 2024-03-02 | Discharge: 2024-03-02 | Disposition: A | Source: Ambulatory Visit | Attending: Radiation Oncology

## 2024-03-02 ENCOUNTER — Other Ambulatory Visit: Payer: Self-pay

## 2024-03-02 LAB — RAD ONC ARIA SESSION SUMMARY
Course Elapsed Days: 8
Plan Fractions Treated to Date: 6
Plan Prescribed Dose Per Fraction: 2 Gy
Plan Total Fractions Prescribed: 30
Plan Total Prescribed Dose: 60 Gy
Reference Point Dosage Given to Date: 12 Gy
Reference Point Session Dosage Given: 2 Gy
Session Number: 6

## 2024-03-05 ENCOUNTER — Ambulatory Visit

## 2024-03-06 ENCOUNTER — Other Ambulatory Visit: Payer: Self-pay

## 2024-03-06 ENCOUNTER — Ambulatory Visit: Admission: RE | Admit: 2024-03-06 | Discharge: 2024-03-06 | Attending: Radiation Oncology

## 2024-03-06 LAB — RAD ONC ARIA SESSION SUMMARY
Course Elapsed Days: 12
Plan Fractions Treated to Date: 7
Plan Prescribed Dose Per Fraction: 2 Gy
Plan Total Fractions Prescribed: 30
Plan Total Prescribed Dose: 60 Gy
Reference Point Dosage Given to Date: 14 Gy
Reference Point Session Dosage Given: 2 Gy
Session Number: 7

## 2024-03-07 ENCOUNTER — Other Ambulatory Visit: Payer: Self-pay | Admitting: Radiation Oncology

## 2024-03-07 ENCOUNTER — Ambulatory Visit: Admission: RE | Admit: 2024-03-07 | Discharge: 2024-03-07 | Attending: Radiation Oncology

## 2024-03-07 ENCOUNTER — Other Ambulatory Visit: Payer: Self-pay

## 2024-03-07 LAB — RAD ONC ARIA SESSION SUMMARY
Course Elapsed Days: 13
Plan Fractions Treated to Date: 8
Plan Prescribed Dose Per Fraction: 2 Gy
Plan Total Fractions Prescribed: 30
Plan Total Prescribed Dose: 60 Gy
Reference Point Dosage Given to Date: 16 Gy
Reference Point Session Dosage Given: 2 Gy
Session Number: 8

## 2024-03-07 MED ORDER — GABAPENTIN 100 MG PO CAPS: ORAL_CAPSULE | ORAL | 1 refills | Status: AC

## 2024-03-08 ENCOUNTER — Ambulatory Visit: Admission: RE | Admit: 2024-03-08 | Discharge: 2024-03-08 | Attending: Radiation Oncology

## 2024-03-08 ENCOUNTER — Other Ambulatory Visit: Payer: Self-pay

## 2024-03-08 LAB — RAD ONC ARIA SESSION SUMMARY
Course Elapsed Days: 14
Plan Fractions Treated to Date: 9
Plan Prescribed Dose Per Fraction: 2 Gy
Plan Total Fractions Prescribed: 30
Plan Total Prescribed Dose: 60 Gy
Reference Point Dosage Given to Date: 18 Gy
Reference Point Session Dosage Given: 2 Gy
Session Number: 9

## 2024-03-09 ENCOUNTER — Ambulatory Visit

## 2024-03-09 ENCOUNTER — Other Ambulatory Visit: Payer: Self-pay

## 2024-03-09 LAB — RAD ONC ARIA SESSION SUMMARY
Course Elapsed Days: 15
Plan Fractions Treated to Date: 10
Plan Prescribed Dose Per Fraction: 2 Gy
Plan Total Fractions Prescribed: 30
Plan Total Prescribed Dose: 60 Gy
Reference Point Dosage Given to Date: 20 Gy
Reference Point Session Dosage Given: 2 Gy
Session Number: 10

## 2024-03-12 ENCOUNTER — Ambulatory Visit

## 2024-03-13 ENCOUNTER — Ambulatory Visit

## 2024-03-14 ENCOUNTER — Ambulatory Visit

## 2024-03-15 ENCOUNTER — Ambulatory Visit

## 2024-03-16 ENCOUNTER — Ambulatory Visit

## 2024-03-19 ENCOUNTER — Ambulatory Visit

## 2024-03-20 ENCOUNTER — Ambulatory Visit

## 2024-03-21 ENCOUNTER — Ambulatory Visit

## 2024-03-22 ENCOUNTER — Ambulatory Visit

## 2024-03-23 ENCOUNTER — Ambulatory Visit

## 2024-03-26 ENCOUNTER — Ambulatory Visit

## 2024-03-27 ENCOUNTER — Ambulatory Visit

## 2024-03-27 ENCOUNTER — Ambulatory Visit: Admitting: Thoracic Surgery (Cardiothoracic Vascular Surgery)

## 2024-03-28 ENCOUNTER — Ambulatory Visit

## 2024-03-29 ENCOUNTER — Ambulatory Visit

## 2024-03-30 ENCOUNTER — Ambulatory Visit

## 2024-04-02 ENCOUNTER — Ambulatory Visit

## 2024-04-03 ENCOUNTER — Ambulatory Visit

## 2024-04-04 ENCOUNTER — Ambulatory Visit

## 2024-04-05 ENCOUNTER — Ambulatory Visit

## 2024-04-06 ENCOUNTER — Ambulatory Visit

## 2024-08-14 ENCOUNTER — Inpatient Hospital Stay

## 2024-08-14 ENCOUNTER — Inpatient Hospital Stay: Admitting: Internal Medicine
